# Patient Record
Sex: Female | Born: 1965 | Race: White | Hispanic: No | Smoking: Never smoker
Health system: Southern US, Community
[De-identification: ages and names within clinical notes are randomized; demographics above are authoritative.]

## PROBLEM LIST (undated history)

## (undated) VITALS — BP 93/65 | HR 108 | Temp 97.5°F | Resp 16 | Ht 67.0 in | Wt 134.0 lb

## (undated) DIAGNOSIS — I951 Orthostatic hypotension: Secondary | ICD-10-CM

## (undated) DIAGNOSIS — G90A Postural orthostatic tachycardia syndrome (POTS): Secondary | ICD-10-CM

## (undated) DIAGNOSIS — F32A Depression, unspecified: Secondary | ICD-10-CM

## (undated) DIAGNOSIS — R51 Headache: Secondary | ICD-10-CM

## (undated) DIAGNOSIS — F419 Anxiety disorder, unspecified: Secondary | ICD-10-CM

## (undated) DIAGNOSIS — Q211 Atrial septal defect, unspecified: Secondary | ICD-10-CM

## (undated) DIAGNOSIS — R000 Tachycardia, unspecified: Secondary | ICD-10-CM

## (undated) DIAGNOSIS — I499 Cardiac arrhythmia, unspecified: Secondary | ICD-10-CM

## (undated) DIAGNOSIS — K589 Irritable bowel syndrome without diarrhea: Secondary | ICD-10-CM

## (undated) DIAGNOSIS — F329 Major depressive disorder, single episode, unspecified: Secondary | ICD-10-CM

## (undated) DIAGNOSIS — M069 Rheumatoid arthritis, unspecified: Secondary | ICD-10-CM

## (undated) DIAGNOSIS — I498 Other specified cardiac arrhythmias: Secondary | ICD-10-CM

## (undated) DIAGNOSIS — M1990 Unspecified osteoarthritis, unspecified site: Secondary | ICD-10-CM

## (undated) HISTORY — DX: Atrial septal defect: Q21.1

## (undated) HISTORY — DX: Rheumatoid arthritis, unspecified: M06.9

## (undated) HISTORY — DX: Atrial septal defect, unspecified: Q21.10

## (undated) HISTORY — PX: LAPAROSCOPY: SHX197

---

## 1987-03-06 HISTORY — PX: CHOLECYSTECTOMY: SHX55

## 2001-01-01 ENCOUNTER — Encounter: Payer: Self-pay | Admitting: Emergency Medicine

## 2001-01-01 ENCOUNTER — Emergency Department (HOSPITAL_COMMUNITY): Admission: EM | Admit: 2001-01-01 | Discharge: 2001-01-01 | Payer: Self-pay | Admitting: Emergency Medicine

## 2001-12-22 ENCOUNTER — Other Ambulatory Visit: Admission: RE | Admit: 2001-12-22 | Discharge: 2001-12-22 | Payer: Self-pay | Admitting: Internal Medicine

## 2002-03-05 HISTORY — PX: DILATION AND CURETTAGE OF UTERUS: SHX78

## 2002-07-09 ENCOUNTER — Ambulatory Visit (HOSPITAL_COMMUNITY): Admission: RE | Admit: 2002-07-09 | Discharge: 2002-07-09 | Payer: Self-pay | Admitting: Obstetrics and Gynecology

## 2002-07-09 ENCOUNTER — Encounter (INDEPENDENT_AMBULATORY_CARE_PROVIDER_SITE_OTHER): Payer: Self-pay | Admitting: Specialist

## 2004-09-10 ENCOUNTER — Encounter: Admission: RE | Admit: 2004-09-10 | Discharge: 2004-09-10 | Payer: Self-pay | Admitting: Emergency Medicine

## 2005-01-10 ENCOUNTER — Ambulatory Visit: Payer: Self-pay | Admitting: Internal Medicine

## 2005-01-12 ENCOUNTER — Ambulatory Visit: Payer: Self-pay | Admitting: Internal Medicine

## 2005-01-12 ENCOUNTER — Encounter (INDEPENDENT_AMBULATORY_CARE_PROVIDER_SITE_OTHER): Payer: Self-pay | Admitting: *Deleted

## 2005-02-19 ENCOUNTER — Other Ambulatory Visit: Admission: RE | Admit: 2005-02-19 | Discharge: 2005-02-19 | Payer: Self-pay | Admitting: Obstetrics and Gynecology

## 2005-02-22 ENCOUNTER — Other Ambulatory Visit: Admission: RE | Admit: 2005-02-22 | Discharge: 2005-02-22 | Payer: Self-pay | Admitting: Obstetrics and Gynecology

## 2005-06-18 ENCOUNTER — Emergency Department (HOSPITAL_COMMUNITY): Admission: EM | Admit: 2005-06-18 | Discharge: 2005-06-18 | Payer: Self-pay | Admitting: Emergency Medicine

## 2006-01-29 ENCOUNTER — Encounter: Admission: RE | Admit: 2006-01-29 | Discharge: 2006-01-29 | Payer: Self-pay | Admitting: Emergency Medicine

## 2006-03-05 HISTORY — PX: PATENT FORAMEN OVALE CLOSURE: SHX2181

## 2006-05-30 ENCOUNTER — Ambulatory Visit (HOSPITAL_COMMUNITY): Admission: RE | Admit: 2006-05-30 | Discharge: 2006-05-30 | Payer: Self-pay | Admitting: Obstetrics and Gynecology

## 2006-05-30 ENCOUNTER — Encounter (INDEPENDENT_AMBULATORY_CARE_PROVIDER_SITE_OTHER): Payer: Self-pay | Admitting: Specialist

## 2006-07-16 ENCOUNTER — Encounter: Admission: RE | Admit: 2006-07-16 | Discharge: 2006-07-16 | Payer: Self-pay | Admitting: Cardiology

## 2007-03-21 ENCOUNTER — Emergency Department (HOSPITAL_COMMUNITY): Admission: EM | Admit: 2007-03-21 | Discharge: 2007-03-21 | Payer: Self-pay | Admitting: Emergency Medicine

## 2007-04-02 ENCOUNTER — Ambulatory Visit: Payer: Self-pay | Admitting: Internal Medicine

## 2007-05-29 ENCOUNTER — Ambulatory Visit: Payer: Self-pay | Admitting: Internal Medicine

## 2007-09-11 ENCOUNTER — Ambulatory Visit (HOSPITAL_COMMUNITY): Admission: RE | Admit: 2007-09-11 | Discharge: 2007-09-11 | Payer: Self-pay | Admitting: Obstetrics and Gynecology

## 2007-09-12 ENCOUNTER — Telehealth: Payer: Self-pay | Admitting: Internal Medicine

## 2007-09-15 ENCOUNTER — Telehealth: Payer: Self-pay | Admitting: Internal Medicine

## 2007-09-22 DIAGNOSIS — Z87898 Personal history of other specified conditions: Secondary | ICD-10-CM | POA: Insufficient documentation

## 2007-09-22 DIAGNOSIS — K589 Irritable bowel syndrome without diarrhea: Secondary | ICD-10-CM

## 2007-09-22 DIAGNOSIS — R1032 Left lower quadrant pain: Secondary | ICD-10-CM

## 2007-09-22 DIAGNOSIS — R110 Nausea: Secondary | ICD-10-CM

## 2007-09-25 ENCOUNTER — Ambulatory Visit: Payer: Self-pay | Admitting: Internal Medicine

## 2007-11-14 ENCOUNTER — Encounter: Payer: Self-pay | Admitting: Internal Medicine

## 2007-11-14 ENCOUNTER — Ambulatory Visit: Payer: Self-pay

## 2007-11-14 ENCOUNTER — Ambulatory Visit: Payer: Self-pay | Admitting: Internal Medicine

## 2008-02-04 ENCOUNTER — Ambulatory Visit: Payer: Self-pay | Admitting: Internal Medicine

## 2008-11-17 ENCOUNTER — Encounter (INDEPENDENT_AMBULATORY_CARE_PROVIDER_SITE_OTHER): Payer: Self-pay | Admitting: *Deleted

## 2008-12-22 ENCOUNTER — Inpatient Hospital Stay (HOSPITAL_COMMUNITY): Admission: AD | Admit: 2008-12-22 | Discharge: 2008-12-22 | Payer: Self-pay | Admitting: Obstetrics & Gynecology

## 2009-01-01 ENCOUNTER — Inpatient Hospital Stay (HOSPITAL_COMMUNITY): Admission: AD | Admit: 2009-01-01 | Discharge: 2009-01-01 | Payer: Self-pay | Admitting: Obstetrics and Gynecology

## 2009-01-05 ENCOUNTER — Inpatient Hospital Stay (HOSPITAL_COMMUNITY): Admission: AD | Admit: 2009-01-05 | Discharge: 2009-01-07 | Payer: Self-pay | Admitting: Obstetrics and Gynecology

## 2009-02-22 ENCOUNTER — Ambulatory Visit: Payer: Self-pay | Admitting: Cardiology

## 2009-02-22 ENCOUNTER — Inpatient Hospital Stay (HOSPITAL_COMMUNITY): Admission: EM | Admit: 2009-02-22 | Discharge: 2009-02-24 | Payer: Self-pay | Admitting: Emergency Medicine

## 2009-02-23 ENCOUNTER — Encounter (INDEPENDENT_AMBULATORY_CARE_PROVIDER_SITE_OTHER): Payer: Self-pay | Admitting: Internal Medicine

## 2009-03-10 ENCOUNTER — Encounter (INDEPENDENT_AMBULATORY_CARE_PROVIDER_SITE_OTHER): Payer: Self-pay | Admitting: *Deleted

## 2009-04-04 ENCOUNTER — Telehealth: Payer: Self-pay | Admitting: Internal Medicine

## 2009-04-07 ENCOUNTER — Ambulatory Visit: Payer: Self-pay | Admitting: Internal Medicine

## 2009-04-07 DIAGNOSIS — Q078 Other specified congenital malformations of nervous system: Secondary | ICD-10-CM

## 2009-04-07 DIAGNOSIS — Z862 Personal history of diseases of the blood and blood-forming organs and certain disorders involving the immune mechanism: Secondary | ICD-10-CM

## 2009-06-28 ENCOUNTER — Encounter (INDEPENDENT_AMBULATORY_CARE_PROVIDER_SITE_OTHER): Payer: Self-pay | Admitting: *Deleted

## 2009-07-27 ENCOUNTER — Ambulatory Visit: Payer: Self-pay | Admitting: Internal Medicine

## 2009-10-21 ENCOUNTER — Ambulatory Visit (HOSPITAL_BASED_OUTPATIENT_CLINIC_OR_DEPARTMENT_OTHER): Payer: 59 | Admitting: Oncology

## 2009-11-03 LAB — LACTATE DEHYDROGENASE: LDH: 137 U/L (ref 94–250)

## 2009-11-03 LAB — COMPREHENSIVE METABOLIC PANEL
ALT: 17 U/L (ref 0–35)
AST: 12 U/L (ref 0–37)
Albumin: 3.9 g/dL (ref 3.5–5.2)
Alkaline Phosphatase: 79 U/L (ref 39–117)
BUN: 12 mg/dL (ref 6–23)
CO2: 21 mEq/L (ref 19–32)
Calcium: 8.9 mg/dL (ref 8.4–10.5)
Chloride: 108 mEq/L (ref 96–112)
Creatinine, Ser: 0.84 mg/dL (ref 0.40–1.20)
Glucose, Bld: 88 mg/dL (ref 70–99)
Potassium: 4.1 mEq/L (ref 3.5–5.3)
Sodium: 139 mEq/L (ref 135–145)
Total Bilirubin: 0.3 mg/dL (ref 0.3–1.2)
Total Protein: 6.3 g/dL (ref 6.0–8.3)

## 2009-11-03 LAB — CBC & DIFF AND RETIC
BASO%: 0.8 % (ref 0.0–2.0)
Basophils Absolute: 0 10*3/uL (ref 0.0–0.1)
EOS%: 1.3 % (ref 0.0–7.0)
Eosinophils Absolute: 0.1 10*3/uL (ref 0.0–0.5)
HCT: 38.1 % (ref 34.8–46.6)
HGB: 12.5 g/dL (ref 11.6–15.9)
Immature Retic Fract: 3.4 % (ref 0.00–10.70)
LYMPH%: 31.4 % (ref 14.0–49.7)
MCH: 32.4 pg (ref 25.1–34.0)
MCHC: 32.8 g/dL (ref 31.5–36.0)
MCV: 98.7 fL (ref 79.5–101.0)
MONO#: 0.5 10*3/uL (ref 0.1–0.9)
MONO%: 8.7 % (ref 0.0–14.0)
NEUT#: 3.1 10*3/uL (ref 1.5–6.5)
NEUT%: 57.8 % (ref 38.4–76.8)
Platelets: 226 10*3/uL (ref 145–400)
RBC: 3.86 10*6/uL (ref 3.70–5.45)
RDW: 13 % (ref 11.2–14.5)
Retic %: 0.65 % (ref 0.50–1.50)
Retic Ct Abs: 25.09 10*3/uL (ref 18.30–72.70)
WBC: 5.3 10*3/uL (ref 3.9–10.3)
lymph#: 1.7 10*3/uL (ref 0.9–3.3)
nRBC: 0 % (ref 0–0)

## 2009-11-03 LAB — IRON AND TIBC: TIBC: 277 ug/dL (ref 250–470)

## 2009-11-09 ENCOUNTER — Encounter: Payer: Self-pay | Admitting: Internal Medicine

## 2010-03-11 ENCOUNTER — Emergency Department (HOSPITAL_COMMUNITY)
Admission: EM | Admit: 2010-03-11 | Discharge: 2010-03-11 | Payer: Self-pay | Source: Home / Self Care | Admitting: Emergency Medicine

## 2010-03-16 ENCOUNTER — Encounter
Admission: RE | Admit: 2010-03-16 | Discharge: 2010-03-16 | Payer: Self-pay | Source: Home / Self Care | Attending: Emergency Medicine | Admitting: Emergency Medicine

## 2010-03-20 LAB — DIFFERENTIAL
Basophils Absolute: 0 10*3/uL (ref 0.0–0.1)
Basophils Relative: 1 % (ref 0–1)
Eosinophils Absolute: 0.1 10*3/uL (ref 0.0–0.7)
Eosinophils Relative: 1 % (ref 0–5)
Lymphocytes Relative: 22 % (ref 12–46)
Lymphs Abs: 1.4 10*3/uL (ref 0.7–4.0)
Monocytes Absolute: 0.7 10*3/uL (ref 0.1–1.0)
Monocytes Relative: 11 % (ref 3–12)
Neutro Abs: 4.3 10*3/uL (ref 1.7–7.7)
Neutrophils Relative %: 66 % (ref 43–77)

## 2010-03-20 LAB — POCT I-STAT, CHEM 8
BUN: 11 mg/dL (ref 6–23)
Calcium, Ion: 1.25 mmol/L (ref 1.12–1.32)
Chloride: 108 mEq/L (ref 96–112)
Creatinine, Ser: 0.8 mg/dL (ref 0.4–1.2)
Glucose, Bld: 98 mg/dL (ref 70–99)
HCT: 39 % (ref 36.0–46.0)
Hemoglobin: 13.3 g/dL (ref 12.0–15.0)
Potassium: 3.8 mEq/L (ref 3.5–5.1)
Sodium: 141 mEq/L (ref 135–145)
TCO2: 23 mmol/L (ref 0–100)

## 2010-03-20 LAB — CBC
HCT: 36 % (ref 36.0–46.0)
Hemoglobin: 11.9 g/dL — ABNORMAL LOW (ref 12.0–15.0)
MCH: 33.7 pg (ref 26.0–34.0)
MCHC: 33.1 g/dL (ref 30.0–36.0)
MCV: 102 fL — ABNORMAL HIGH (ref 78.0–100.0)
Platelets: 209 10*3/uL (ref 150–400)
RBC: 3.53 MIL/uL — ABNORMAL LOW (ref 3.87–5.11)
RDW: 14.6 % (ref 11.5–15.5)
WBC: 6.6 10*3/uL (ref 4.0–10.5)

## 2010-03-20 LAB — RAPID URINE DRUG SCREEN, HOSP PERFORMED
Amphetamines: NOT DETECTED
Barbiturates: NOT DETECTED
Benzodiazepines: POSITIVE — AB
Cocaine: NOT DETECTED
Opiates: NOT DETECTED
Tetrahydrocannabinol: NOT DETECTED

## 2010-03-20 LAB — CK: Total CK: 70 U/L (ref 7–177)

## 2010-03-20 LAB — ETHANOL: Alcohol, Ethyl (B): <5 mg/dL (ref 0–10)

## 2010-04-02 ENCOUNTER — Emergency Department (HOSPITAL_BASED_OUTPATIENT_CLINIC_OR_DEPARTMENT_OTHER)
Admission: EM | Admit: 2010-04-02 | Discharge: 2010-04-02 | Payer: Self-pay | Source: Home / Self Care | Admitting: Emergency Medicine

## 2010-04-02 LAB — URINE MICROSCOPIC-ADD ON

## 2010-04-02 LAB — POCT CARDIAC MARKERS
CKMB, poc: <1 ng/mL — ABNORMAL LOW (ref 1.0–8.0)
Myoglobin, poc: 66.3 ng/mL (ref 12–200)
Troponin i, poc: <0.05 ng/mL (ref 0.00–0.09)

## 2010-04-02 LAB — URINALYSIS, ROUTINE W REFLEX MICROSCOPIC
Hgb urine dipstick: NEGATIVE
Ketones, ur: >80 mg/dL — AB
Nitrite: NEGATIVE
Protein, ur: NEGATIVE mg/dL
Specific Gravity, Urine: 1.02 (ref 1.005–1.030)
Urine Glucose, Fasting: NEGATIVE mg/dL
Urobilinogen, UA: 0.2 mg/dL (ref 0.0–1.0)
pH: 6.5 (ref 5.0–8.0)

## 2010-04-02 LAB — PREGNANCY, URINE: Preg Test, Ur: NEGATIVE

## 2010-04-02 LAB — BASIC METABOLIC PANEL
BUN: 8 mg/dL (ref 6–23)
CO2: 20 mEq/L (ref 19–32)
Calcium: 9.9 mg/dL (ref 8.4–10.5)
Chloride: 110 mEq/L (ref 96–112)
Creatinine, Ser: 0.9 mg/dL (ref 0.4–1.2)
GFR calc Af Amer: >60 mL/min (ref >60)
GFR calc non Af Amer: >60 mL/min (ref >60)
Glucose, Bld: 112 mg/dL — ABNORMAL HIGH (ref 70–99)
Potassium: 3.6 mEq/L (ref 3.5–5.1)
Sodium: 142 mEq/L (ref 135–145)

## 2010-04-04 NOTE — Letter (Signed)
Summary: Appointment - Missed  [facility], Main Office  [address]   [place], [place] [address]   Phone: [phone]  Fax: [phone]     March 10, 2009 MRN: [id number]   [facility] [address] [place], [place]  [address]   Dear Ms. PICKNEY,  Our records indicate you missed your appointment on 02/11/09 with Dr. [name].                                    It is very important that we reach you to reschedule this appointment. We look forward to participating in your health care needs. Please contact us at the number listed above at your earliest convenience to reschedule this appointment.     Sincerely,   Ruel Favors Scheduling Team

## 2010-04-04 NOTE — Progress Notes (Signed)
Summary: pt wants appt for low b/p ( review w/ SK)  Phone Note Call from Patient Call back at Home Phone [phone]   Caller: Patient Reason for Call: Talk to Nurse, Talk to Doctor Summary of Call: pt want to have an appt b/p was 80/60 standing 86/60 sitting she was at pcp today Initial call taken by: Omer Jack,  April 04, 2009 1:41 PM  Follow-up for Phone Call        I called and spoke with the pt. She states she saw her PCP today for a regular physical and he noted her bp readings as stated above. The pt states she was admitted to [facility] last month for 2 days due to syncope and SBP of . The pt states she had been dealing wth diarrhea prior to that so she was admitted for hydration at that time as well. The pt states she was urged by her PCP today to increase her salt intake and hydrate herself. I explained to the pt I would forward this message to Dr. [name] and his nurse Shawna Orleans to see if they need to work her in for an appt. The pt states she missed her last appt due to the fact that she just had a baby about 3 months ago. I will ask Melanie-RN for Dr. [name] to call the pt back tomorrow with any recommendations Dr. [name] may have and with an appt for f/u. The pt is agreeable. Follow-up by: Sherri Rad, RN, BSN,  April 04, 2009 1:54 PM  Additional Follow-up for Phone Call Additional follow up Details #1::        Pt worked into schedule on 04/26/09 Additional Follow-up by: Duncan Dull, RN, BSN,  April 05, 2009 8:59 AM

## 2010-04-04 NOTE — Assessment & Plan Note (Signed)
Summary: rov/jml   Primary Provider:  Leslee Home, M.D.   History of Present Illness: Jennifer [name] is seen in followup for her dysautonomia manifesting primarily as pots and now has trouble with orthostatic hypotension. She was actually admitted to hospital a couple of months ago in the context of significant dehydration related to diarrhea. This was treated empirically as C. difficile was of poor resolution. She's had no further syncope. She does have orthostatic intolerance and some presyncope. There is accompanying nausea particularly at night.  She gave birth about 4 or 5 months ago preceding all of this. The post partum period has further been complicated by depression for which she is being treated  Current Medications (verified): 1)  Flexeril 10 Mg  Tabs (Cyclobenzaprine Hcl) .... Take 1 Tablet By Mouth As Needed 2)  Topamax 100 Mg Tabs (Topiramate) .... Take 3 Tablets Once Daily 3)  Paxil 40 Mg Tabs (Paroxetine Hcl) .... Take One Tablet Once Daily 4)  Cipro 500 Mg Tabs (Ciprofloxacin Hcl) .... Take One Tablet Two Times A Day For 10 Days 5)  Fioricet 50-325-40 Mg Tabs (Butalbital-Apap-Caffeine) .... Take One Tablet As Needed  Allergies (verified): 1)  ! Sulfa  Past History:  Past Medical History: Last updated: 09/22/2007 Current Problems:  NAUSEA (ICD-787.02) ABDOMINAL PAIN, LEFT LOWER QUADRANT (ICD-789.04) IRRITABLE BOWEL SYNDROME (ICD-564.1) MIGRAINES, HX OF (ICD-V13.8) Family Hx of COLON CANCER (ICD-153.9)  Past Surgical History: Last updated: 09/22/2007 cholecystectomy 1990 D & C laproscopy 1993 Heart Surgery (PFO) 2008  Family History: Last updated: 09/25/2007 Family History of Colon Cancer: Grandmother Paternal Family History of Liver Cancer: Paternal Grandmother Family History of Diabetes:  Uncle Family History of Heart Disease: Maternal Grandfather Family History of Liver Disease/Cirrhosis:Great uncle  Social History: Last updated: 09/25/2007 Patient is  divorced and has 2 children Alcohol Use - yes-rarely Illicit Drug Use - no Patient has never smoked.  Daily Caffeine Use  Vital Signs:  Patient profile:   45 year old female Height:      67 inches Weight:      176 pounds BMI:     27.67 Pulse rate:   78 / minute Pulse (ortho):   96 / minute Pulse rhythm:   irregular BP sitting:   98 / 66  (left arm) BP standing:   86 / 68 Cuff size:   regular  Vitals Entered By: Judithe Modest CMA (April 07, 2009 8:41 AM)  Serial Vital Signs/Assessments:  Time      Position  BP       Pulse  Resp  Temp     By 9:10 AM   Lying RA  91/64    74                    Amanda Trulove CMA 9:10 AM   Sitting   92/68    84                    Amanda Trulove CMA 9:10 AM   Standing  86/68    96                    Amanda Trulove CMA  Comments: 9:10 AM 2 minutes-102/66 HR 86 3 minutes-104/68 HR 84  Patient reported dizziness increasing as she stood and nausea that also increased.  Pt had to sit down for fear of falling then standing pressures where repeated manually.   By: Judithe Modest CMA    Physical Exam  General:  The patient was alert and oriented in no acute distress. HEENT Normal.  Neck veins were flat, carotids were brisk.  Lungs were clear.  Heart sounds were regular without murmurs or gallops.  Abdomen was soft with active bowel sounds. There is no clubbing cyanosis or edema. Skin Warm and dry affect is flat   Impression & Recommendations:  Problem # 1:  DYSAUTONOMIA (ICD-742.8) her dysautonomia  has been aggravated in the setting of her postpartum situation. Her to be in factors could include volume shifts, the chronic diarrhea which is now finally resolved, or the depression. In any case she is quite symptomatic. She has a history of migraine headaches which may be adversely impacted by the use of an alpha agonist; I will need to look this up. Alternatively we discussed the use of salt replacement. I have given her the name of a couple  of places where she may be able to get salt tablets. We discussed potential for bloating and weight gain both of which are of concern for her.  I've also instructed her to raise the head of her bed about 6 inches as this may help her nocturnal orthostasis.  It is further important for them to be aggressive in treatment of her postpartum depression.  the other issue which will need to be explored is a potential contribution of hemochromatosis to dysautonomia; hospital records were reviewed from a most of the above but has no reference to the hemochromatosis. She is scheduled to see Dr. Cyndie Chime    Patient Instructions: 1)  Your physician recommends that you schedule a follow-up appointment in: 8-12 WEEKS

## 2010-04-04 NOTE — Assessment & Plan Note (Signed)
Summary: rov/jml   Visit Type:  Follow-up Primary Provider:  Leslee Home, M.D.  CC:  MILD CHEST PAIN.  History of Present Illness: Mrs. Insley is seen in followup for her dysautonomia manifesting primarily as pots and now has trouble with orthostatic hypotension.   . She's had no further syncope. She does have orthostatic intolerance and some presyncope. There is accompanying nausea particularly at night.  She gave birth about 4 or 5 months ago preceding all of this. The post partum period has further been complicated by depression for which she is being treated and thPristiq has been significantly beneficial in this regard.  she is working hard of salt and water ingestion; she struggled with her weight however.  I should note that she has a past history of migraine headaches  Current Medications (verified): 1)  Flexeril 10 Mg  Tabs (Cyclobenzaprine Hcl) .... Take 1 Tablet By Mouth As Needed 2)  Topamax 100 Mg Tabs (Topiramate) .... Take 3 Tablets Once Daily 3)  Fioricet 50-325-40 Mg Tabs (Butalbital-Apap-Caffeine) .... Take One Tablet As Needed 4)  Pristiq 100 Mg Xr24h-Tab (Desvenlafaxine Succinate) .Marland Kitchen.. 1 Tab Once Daily 5)  Alprazolam 1 Mg Tabs (Alprazolam) .... As Needed 6)  Promethazine .... As Needed 7)  Vitamin D 50,000 Units .Marland Kitchen.. 1 Tab Q Weekly 8)  Ibubprofen .... Prn  Allergies (verified): 1)  ! Sulfa  Vital Signs:  Patient profile:   45 year old female Height:      67 inches Weight:      171 pounds Pulse rate:   68 / minute BP sitting:   92 / 66  (left arm) Cuff size:   regular  Vitals Entered By: Burnett Kanaris, CNA (Jul 27, 2009 4:18 PM)  Physical Exam  General:  The patient was alert and oriented in no acute distress. HEENT Normal.  Neck veins were flat, carotids were brisk.  Lungs were clear.  Heart sounds were regular without murmurs or gallops.  Abdomen was soft with active bowel sounds. There is no clubbing cyanosis or edema. Skin Warm and  dry    Impression & Recommendations:  Problem # 1:  DYSAUTONOMIA (ICD-742.8) swill continue to work consult and water retention. She and we will look at the possibility of ProAmatine but we aren't concerned about its potential aggravation of her migraine headaches. We also discussed Florinef but she is averse to the idea of salt retention. We also discussed the issues of protection in the heat.  We spent more than 50% of her 25 minute visit discussing physiology and potential therapeutic maneuvers to mitigate against her dysautonomic tendencies.  Patient Instructions: 1)  Your physician recommends that you schedule a follow-up appointment in: 6 MONTHS WITH DR [name]

## 2010-04-04 NOTE — Letter (Signed)
Summary: [facility]  [facility]   Imported By: Marylou Mccoy 12/08/2009 10:22:18  _____________________________________________________________________  External Attachment:    Type:   Image     Comment:   External Document

## 2010-04-04 NOTE — Letter (Signed)
Summary: Appointment - Missed  [facility], Main Office  [address]   [place], [place] [address]   Phone: [phone]  Fax: [phone]     June 28, 2009 MRN: [id number]   [facility] [address] [place], [place]  [address]   Dear Ms. LIEVANOS,  Our records indicate you missed your appointment on 06/10/09 with Dr. [name]. It is very important that we reach you to reschedule this appointment. We look forward to participating in your health care needs. Please contact us at the number listed above at your earliest convenience to reschedule this appointment.     Sincerely,   Ruel Favors Scheduling Team

## 2010-04-05 ENCOUNTER — Encounter: Payer: Self-pay | Admitting: Emergency Medicine

## 2010-06-05 LAB — COMPREHENSIVE METABOLIC PANEL
ALT: 16 U/L (ref 0–35)
AST: 18 U/L (ref 0–37)
Albumin: 3.1 g/dL — ABNORMAL LOW (ref 3.5–5.2)
Alkaline Phosphatase: 70 U/L (ref 39–117)
BUN: 9 mg/dL (ref 6–23)
CO2: 22 mEq/L (ref 19–32)
Calcium: 8.4 mg/dL (ref 8.4–10.5)
Chloride: 110 mEq/L (ref 96–112)
Creatinine, Ser: 1 mg/dL (ref 0.4–1.2)
GFR calc Af Amer: >60 mL/min (ref >60)
GFR calc non Af Amer: >60 mL/min (ref >60)
Glucose, Bld: 110 mg/dL — ABNORMAL HIGH (ref 70–99)
Potassium: 4 mEq/L (ref 3.5–5.1)
Sodium: 138 mEq/L (ref 135–145)
Total Bilirubin: 0.7 mg/dL (ref 0.3–1.2)
Total Protein: 5.8 g/dL — ABNORMAL LOW (ref 6.0–8.3)

## 2010-06-05 LAB — POCT CARDIAC MARKERS: Myoglobin, poc: 85.7 ng/mL (ref 12–200)

## 2010-06-05 LAB — URINALYSIS, ROUTINE W REFLEX MICROSCOPIC
Bilirubin Urine: NEGATIVE
Glucose, UA: NEGATIVE mg/dL
Hgb urine dipstick: NEGATIVE
Ketones, ur: NEGATIVE mg/dL
Nitrite: NEGATIVE
Protein, ur: NEGATIVE mg/dL
Specific Gravity, Urine: 1.013 (ref 1.005–1.030)
Urobilinogen, UA: 1 mg/dL (ref 0.0–1.0)
pH: 6.5 (ref 5.0–8.0)

## 2010-06-05 LAB — GLUCOSE, CAPILLARY: Glucose-Capillary: 106 mg/dL — ABNORMAL HIGH (ref 70–99)

## 2010-06-05 LAB — DIFFERENTIAL
Basophils Absolute: 0 10*3/uL (ref 0.0–0.1)
Basophils Relative: 0 % (ref 0–1)
Eosinophils Absolute: 0.2 10*3/uL (ref 0.0–0.7)
Eosinophils Relative: 3 % (ref 0–5)
Monocytes Absolute: 0.4 10*3/uL (ref 0.1–1.0)
Monocytes Relative: 7 % (ref 3–12)

## 2010-06-05 LAB — URINE MICROSCOPIC-ADD ON

## 2010-06-05 LAB — CBC
HCT: 37.4 % (ref 36.0–46.0)
Platelets: 186 10*3/uL (ref 150–400)
RDW: 12.4 % (ref 11.5–15.5)
WBC: 5.7 10*3/uL (ref 4.0–10.5)

## 2010-06-05 LAB — URINE CULTURE
Colony Count: NO GROWTH
Culture: NO GROWTH

## 2010-06-05 LAB — CARDIAC PANEL(CRET KIN+CKTOT+MB+TROPI)
CK, MB: 1.3 ng/mL (ref 0.3–4.0)
Total CK: 44 U/L (ref 7–177)
Total CK: 49 U/L (ref 7–177)
Troponin I: 0.01 ng/mL (ref 0.00–0.06)

## 2010-06-05 LAB — CLOSTRIDIUM DIFFICILE EIA: C difficile Toxins A+B, EIA: NEGATIVE

## 2010-06-05 LAB — T4, FREE: Free T4: 0.93 ng/dL (ref 0.80–1.80)

## 2010-06-07 LAB — CBC
HCT: 36.1 % (ref 36.0–46.0)
HCT: 40.8 % (ref 36.0–46.0)
Hemoglobin: 13.9 g/dL (ref 12.0–15.0)
MCV: 106.9 fL — ABNORMAL HIGH (ref 78.0–100.0)
RBC: 3.38 MIL/uL — ABNORMAL LOW (ref 3.87–5.11)
RBC: 3.83 MIL/uL — ABNORMAL LOW (ref 3.87–5.11)
WBC: 18.4 10*3/uL — ABNORMAL HIGH (ref 4.0–10.5)

## 2010-07-18 NOTE — Assessment & Plan Note (Signed)
 HEALTHCARE                         ELECTROPHYSIOLOGY OFFICE NOTE   Jennifer, [name]                       MRN:          [id number]  DATE:11/14/2007                            DOB:          05-04-1965    Jennifer [name] is seen in followup for her dysautonomia.  She is also status  post PFO repair at [facility].  She has had no recurrent syncope.  She has had  a couple of episodes of lightheadedness associated with having been on  the beach or snow clearing in the [place], but otherwise, is doing quite  well.  She has been aggressive in her salt and water repletion.   Her other medications include Effexor and Topamax.   PHYSICAL EXAMINATION:  VITAL SIGNS:  Today, her blood pressure was  104/60, her weight was 152, and her pulse was 68.  LUNGS:  Clear.  HEART:  Heart sounds were regular.  EXTREMITIES:  Without edema.   Her echo demonstrated normal left ventricular function, normal atrial  size.   IMPRESSION:  1. Postural orthostatic tachycardia with orthostatic intolerance.  2. Significant psychosocial stress related to her sons - better.  3. Status post patent foramen ovale repair.   Jennifer [name] is stable.  We will plan to see her again in a year.     Duke Salvia, MD, [facility]  Electronically Signed    SCK/MedQ  DD: 11/14/2007  DT: 11/15/2007  Job #: [id number]   cc:   Jennifer [name], M.D.

## 2010-07-18 NOTE — Assessment & Plan Note (Signed)
[facility]                         ELECTROPHYSIOLOGY OFFICE NOTE   LACORA, FOLMER                       MRN:          [id number]  DATE:05/29/2007                            DOB:          1965-04-21    Mrs. Szilagyi comes in 2 months after initial evaluation for what was  thought to be at that point dysautonomia.  She is feeling some better.  She has increase her salt intake and fluid intake.  She continues to  have some dizziness, however, particularly waking at night, as well as  early in the morning or getting up from a chair quickly.  This is  recently accompanied by nausea, particularly when she showers, her  shower water being more tepid now than hot, and this is helping some,  but not a lot.   Her weight is 155 pounds which is up 5 pounds.  She is aware of the  increased weight.  Her blood pressure was 111/68, the pulse was 79.  Lungs were clear.  Heart sounds were regular.   IMPRESSION:  1. Dysautonomia manifested by postural tachycardia and recurrent      syncope.  2. Significant psychosocial stress.   Mrs. Alpern is doing better.  We discussed alternative non-medicinal  therapies, including raising the head of bed 2 inches initially, then 6  inches.  We talked about the role of isometric exercise prior to  standing and potentially using a shower chair.   We will see her again in four months' time.  At this point, she is quite  reluctant to pursue any more medicinal therapy.     Duke Salvia, MD, [facility]  Electronically Signed    SCK/MedQ  DD: 05/29/2007  DT: 05/30/2007  Job #: [id number]   cc:   Reuben Likes, M.D.

## 2010-07-18 NOTE — Letter (Signed)
April 02, 2007    Jennifer [name], M.D.  [address]  [place], [place] [address]   RE:  Jennifer [name]  MRN:  [id number]  /  DOB:  Jan 16, 1966   Dear Jennifer [name]:   It was a pleasure to see Jennifer [name] today at your request because of  recurrent syncope.   As she knows she is a 45 year old divorced mother of 2 who has had two  recent syncopal episodes.  The first episode occurred about a week or 2  ago where she had got up from being in bed.  She went to her son's room.  She talked to him and started to turn to leave the room and passed out,  awakening sort of half as she went down.  She got up from the floor,  felt a little bit dizzy, went back to bed and felt better about 15  minutes later.  She showered later that morning and became quite dizzy  and nauseated in the shower.   The next episode occurred the next morning.  Again she was laying down  in her bed.  She got up for a bed to walk down the hall and then lost  consciousness outside her other son's bedroom hitting her head on the  door and her arm on the door on the door jam.  The post event symptoms  included nausea.  There was no diaphoresis and no significant prodrome.   She does have a longstanding history of presyncope which is often  orthostatic.  It is not infrequently accompanied by nausea.  She has  shower intolerance with dizziness and nausea.  She has heat intolerance  as well.  Her diet sounds like it is quite salt replete.  Her volume  status sounds like it is quite fluid deplete.   Her psychosocial status may be contributing.  She is a single mother, Jennifer [name]  you know.  She has two boys who carry diagnosis of ADHD and life is  really quite stressful.  She marked anxiety on review of systems.  I do  not know whether depression is part of this or not.   Her cardiac history is notable for a recent Her cardiac history is also  notable for recent closure of PFO has been associated with a significant  improvement in  her migraine frequency.   Her other surgeries include laparoscopy for endometriosis.  Cryosurgery  on her cervix.  Cholecystectomy and a D&C.   SOCIAL HISTORY:  Is as noted previously.  She has 2 sons 11 and 8.  She  does not use cigarettes or recreational drugs.  She does drink alcohol  occasionally.   REVIEW OF SYSTEMS:  In addition to above is notable for menstrual  dysfunction, stomach disorders, and constipation.   Medications include the Topamax 300 mg a day, Effexor 150, Flexeril and  Zanaflex as well as baby aspirin.   She is allergic to SULFA.   EXAMINATION:  She is a young Caucasian female appearing her stated age  of 89.  Her blood pressure was 99/71 with a pulse of 92 lying, sitting was  96/71, pulse of 93.  Standing at zero minutes was 100/60 with a pulse of  150 and 2 minutes at 115/86 with a pulse was 128, and 5 minutes were  90/68 with a pulse of 114.  HEENT exam demonstrated there is no xanthoma.  The neck veins were flat.  Carotids are brisk and full bilaterally out  bruits.  The back with a without kyphosis or scoliosis.  Lungs were clear.  HEART:  Sounds were regular without murmurs or gallops.  ABDOMEN:  Soft with active bowel sounds without midline pulsation or  hepatomegaly.  Femoral pulses were 2+.  Distal pulses were intact.  There is no  clubbing, cyanosis or edema.  Neurological exam was grossly normal.  Her skin was warm and dry.   Electrocardiogram from your office demonstrated sinus rhythm at 88 with  intervals of 0.16/0.08/0.39.   IMPRESSION:  1. Recurrent syncope.  2. Evidence of postural intolerance with orthostatic tachycardia.  3. Anxiety.  4. Status post PFO closure.   DISCUSSION:  Jennifer [name], Jennifer [name] has abrupt syncope which was quite striking.  These occur in the constellation of significant symptoms of orthostatic  intolerance and evidence on vital signs examination of the same.  This  is most likely mechanism especially given the fact  that her heart has  been evaluated and is almost certainly structurally normal apart from  PFO.   I have given her the information from the [place] book from [place] as well  as the at the website for the NDRF.org website to read about her  dysautonomia.   From a therapeutic strategy I suggest that we do is increase her salt  and water intake copiously until her urine is clear.  I am not sure that  other therapies right now are going to be helpful.  I did suggest to her  that if the stress in her life is such that counseling may be helpful  addressing psychosocial aspects here are important as they seem to co-  migrate with her frequency of dysautonomic symptoms.  She will consider  this.   We will plan to see her again in 4 to 6 weeks to she how is doing.   Thank you for the consultation.    Sincerely,      Jennifer Salvia, MD, [facility]  Electronically Signed    SCK/MedQ  DD: 04/02/2007  DT: 04/02/2007  Job #: [id number]   CC:    Jennifer [name], M.D. at [facility]

## 2010-07-18 NOTE — Assessment & Plan Note (Signed)
[facility]                         ELECTROPHYSIOLOGY OFFICE NOTE   Jennifer [name], Jennifer [name]                       MRN:          [id number]  DATE:02/04/2008                            DOB:          [birth year]    Jennifer [name] comes in today to discuss pregnancy in the context of her  dysautonomic syndromes.   She did not have a diagnosis of dysautonomia or symptoms of  lightheadedness prior to delivery of her more recent child 9 years ago.   We reviewed the issues of volume expansion and the potential benefit  during the first trimester and second trimester.  We dicussed the issue  of uterine obstruction to venous return in the third trimester, and that  there had been some reports of significant difficulties with delivery in  patients with dysautonomic syndromes.  I also speculated that given the  fact that these syndromes have been around a long time, but only  recently described that undoubtedly many numbers of women had delivered  babies without significant difficulty with these dysautonomic syndromes.  I think the other issue will be attention to volume loss at the time of  delivery and vasodilators that might be used peripartum.   We will be glad to be available to her obstetrician as needed.     Duke Salvia, MD, [facility]  Electronically Signed    SCK/MedQ  DD: 02/04/2008  DT: 02/05/2008  Job #: [id number]

## 2010-07-21 NOTE — H&P (Signed)
NAMELOVELY, KERINS NO.:  [id number]   MEDICAL RECORD NO.:  [id number]          PATIENT TYPE:  AMB   LOCATION:  [facility]                           FACILITY:  [facility]   PHYSICIAN:  Zelphia Cairo, MD    DATE OF BIRTH:  11-25-1965   DATE OF ADMISSION:  DATE OF DISCHARGE:                              HISTORY & PHYSICAL   A 45 year old white female with persistent menorrhagia.  She reports  menstrual cycles are regular with increasing dysmenorrhea and  menorrhagia associated with quarter-size clotting.  She was tried on  oral contraceptive pills.  However, due to worsening headache she  discontinued.   PAST MEDICAL HISTORY:  Include migraine headaches.   SURGICAL HISTORY:  Cholecystectomy, laparoscopy, and varicose vein  surgery.   ALLERGIES:  SULFA.   MEDICATIONS:  Topamax and Effexor.   Pap smear done January 2008 was consistent with ASCUS but negative HPV.  Gonorrhea and Chlamydia cultures were negative.   PHYSICAL EXAM:  VITAL SIGNS:  Height 5 feet 7 inches, weight 161, blood  pressure 108/70, hemoglobin 11.7.  Urinalysis negative.  HEENT:  Head and neck normal.  No thyromegaly or nodularity.  HEART:  Regular.  LUNGS:  Clear bilaterally.  ABDOMEN: Soft, nontender.  No palpable masses.  BREASTS:  Symmetrical.  No masses or lesions.  PELVIC:  Reveals normal external female genitalia.  Vagina and cervix  are normal.  No lesions.  Uterus is mobile and nontender.  No adnexal  masses are noted. Ultrasound was performed February 2008 showing no  adnexal or uterine masses.  There is no free fluid within the pelvis.  Endometrial thickness measured 0.4 cm.   ASSESSMENT/PLAN:  Menorrhagia. Treatment options were discussed with the  patient who elects to proceed with hysteroscopy D&C.  Informed consent  was obtained.      Zelphia Cairo, MD  Electronically Signed     GA/MEDQ  D:  05/29/2006  T:  05/29/2006  Job:  [id number]

## 2010-07-21 NOTE — Op Note (Signed)
[name], Jennifer [name] NO.:  [id number]   MEDICAL RECORD NO.:  [id number]          PATIENT TYPE:  AMB   LOCATION:  SDC                           FACILITY:  [facility]   PHYSICIAN:  Zelphia Cairo, MD    DATE OF BIRTH:  Jul 05, 1965   DATE OF PROCEDURE:  05/30/2006  DATE OF DISCHARGE:                               OPERATIVE REPORT   PREOPERATIVE DIAGNOSIS:  Menorrhagia.   POSTOPERATIVE DIAGNOSIS:  Menorrhagia.   PROCEDURE:  Hysteroscopy, dilatation and curettage.   SURGEON:  Zelphia Cairo, MD   ASSISTANT:  None.   ANESTHESIA:  General.   SPECIMEN:  Endometrial curetting's.   ESTIMATED BLOOD LOSS:  Minimal.   COMPLICATIONS:  None.   FLUID DEFICIT:  (Sorbitol) 10 mL.   CONDITION:  Stable and extubated to recovery room.   FINDINGS:  Uterine lining appeared smooth without evidence of fibroids  or polyps.  Endometrial lining was thin, consistent with a cycle day  four.  Bilateral ostia were visualized and were normal.   PROCEDURE:  The patient was taken to the operating room where she was  placed in the dorsal lithotomy position using Allen stirrups.  General  anesthesia was obtained without difficulty.  She was prepped and draped  in sterile fashion and a sterile catheter was used to drain her bladder  for approximately 25 mL of clear urine.  A bivalve speculum was placed  in the vagina and a single-tooth tenaculum was placed on the anterior  lip of the cervix.  The cervix was easily dilated using Pratt dilators.  The diagnostic hysteroscope was inserted into the uterus and the cavity  was inspected.  The findings as above.  The hysteroscope was then  removed and a gentle curetting was performed.  The specimen was placed  on Telfa and passed off and sent to pathology.  The single-tooth  tenaculum  was then removed from the cervix.  8 mL of 1% lidocaine were used for  local anesthesia in the cervical block.  Cervical hemostasis was  assured.  The speculum was  removed.  Sponge, lap, needle and instrument  counts were correct x2.  The patient was taken to the recovery room in  stable condition.      Zelphia Cairo, MD  Electronically Signed     GA/MEDQ  D:  05/30/2006  T:  05/30/2006  Job:  [id number]

## 2010-10-19 ENCOUNTER — Encounter (HOSPITAL_COMMUNITY)
Admission: RE | Admit: 2010-10-19 | Discharge: 2010-10-19 | Disposition: A | Discharge status: Home / Self Care | Payer: 59 | Source: Ambulatory Visit | Attending: Obstetrics and Gynecology | Admitting: Obstetrics and Gynecology

## 2010-10-19 ENCOUNTER — Encounter (HOSPITAL_COMMUNITY): Payer: Self-pay

## 2010-10-19 HISTORY — DX: Headache: R51

## 2010-10-19 HISTORY — DX: Orthostatic hypotension: I95.1

## 2010-10-19 HISTORY — DX: Depression, unspecified: F32.A

## 2010-10-19 HISTORY — DX: Hemochromatosis, unspecified: E83.119

## 2010-10-19 HISTORY — DX: Irritable bowel syndrome, unspecified: K58.9

## 2010-10-19 HISTORY — DX: Cardiac arrhythmia, unspecified: I49.9

## 2010-10-19 HISTORY — DX: Anxiety disorder, unspecified: F41.9

## 2010-10-19 HISTORY — DX: Major depressive disorder, single episode, unspecified: F32.9

## 2010-10-19 HISTORY — DX: Unspecified osteoarthritis, unspecified site: M19.90

## 2010-10-19 LAB — CBC
MCHC: 33.6 g/dL (ref 30.0–36.0)
MCV: 98.1 fL (ref 78.0–100.0)
Platelets: 153 10*3/uL (ref 150–400)
RDW: 12.5 % (ref 11.5–15.5)
WBC: 4.8 10*3/uL (ref 4.0–10.5)

## 2010-10-19 LAB — SURGICAL PCR SCREEN: Staphylococcus aureus: NEGATIVE

## 2010-10-19 NOTE — Anesthesia Preprocedure Evaluation (Signed)
Anesthesia Evaluation  Name, MR# and DOB Patient awake  General Assessment Comment  Reviewed: Allergy & Precautions, H&P , NPO status , Patient's Chart, lab work & pertinent test results  Airway Mallampati: I TM Distance: >3 FB Neck ROM: Full    Dental   Pulmonary    pulmonary exam normalPulmonary Exam Normal     Cardiovascular     Neuro/Psych   GI/Hepatic/Renal negative GI ROS, negative Liver ROS, and negative Renal ROS (+)       Endo/Other  Negative Endocrine ROS (+)      Abdominal Normal abdominal exam  (+)   Musculoskeletal negative musculoskeletal ROS (+)   Hematology   Peds  Reproductive/Obstetrics negative OB ROS    Anesthesia Other Findings Had a PFO repaired in 2008. Echo is normal. Takes med for migraines            Anesthesia Physical Anesthesia Plan  ASA: II  Anesthesia Plan: General   Post-op Pain Management:    Induction: Intravenous  Airway Management Planned: Oral ETT  Additional Equipment:   Intra-op Plan:   Post-operative Plan: Extubation in OR  Informed Consent: I have reviewed the patients History and Physical, chart, labs and discussed the procedure including the risks, benefits and alternatives for the proposed anesthesia with the patient or authorized representative who has indicated his/her understanding and acceptance.   Dental advisory given  Plan Discussed with:   Anesthesia Plan Comments:         Anesthesia Quick Evaluation

## 2010-10-19 NOTE — Patient Instructions (Signed)
20 Jennifer [name]  10/19/2010   Your procedure is scheduled on:  10/26/10  Report to [facility] at 0600 AM.  Call this number if you have problems the morning of surgery: [phone]   Remember:   Do not eat food:After Midnight.  Do not drink clear liquids: After Midnight.  Take these medicines the morning of surgery with A SIP OF WATER: per anesthesia   Do not wear jewelry, make-up or nail polish.  Do not wear lotions, powders, or perfumes. You may wear deodorant.  Do not shave 48 hours prior to surgery.  Do not bring valuables to the hospital.  Contacts, dentures or bridgework may not be worn into surgery.  Leave suitcase in the car. After surgery it may be brought to your room.  For patients admitted to the hospital, checkout time is 11:00 AM the day of discharge.   Patients discharged the day of surgery will not be allowed to drive home.  Name and phone number of your driver: Cherlynn Polo- [phone]  Special Instructions: CHG Shower Use Special Wash: 1/2 bottle night before surgery and 1/2 bottle morning of surgery.   Please read over the following fact sheets that you were given:

## 2010-10-24 NOTE — H&P (Signed)
Jennifer [name], SCHLEGEL NO.:  [id number]  MEDICAL RECORD NO.:  [id number]  LOCATION:  PERIO                         FACILITY:  [facility]  PHYSICIAN:  Zelphia Cairo, MD    DATE OF BIRTH:  02-Apr-1965  DATE OF ADMISSION:  08/31/2010 DATE OF DISCHARGE:                             HISTORY & PHYSICAL   A 45 year old, G4, P3, presents today for surgical management of menorrhagia.  She also desires permanent sterilization.  PAST MEDICAL HISTORY: 1. Migraine headaches. 2. Irritable bowel syndrome. 3. History of urinary tract infections.  MEDICATIONS:  Include Pristiq, Topamax, Xanax, tramadol, Xifaxan, and Librax.  SOCIAL HISTORY:  Negative for tobacco, alcohol, and drug use.  ALLERGIES:  Sulfa and morphine.  SURGICAL HISTORY:  Negative.  PHYSICAL EXAMINATION:  GENERAL:  She is in no acute distress. VITAL SIGNS:  Blood pressure is 100/64. HEART:  Regular rate and rhythm. LUNGS:  Clear bilaterally. ABDOMEN:  Soft, nontender, nondistended.  No rebound or guarding. PELVIC:  Shows normal external female genitalia.  Vagina appears normal. Uterus is mobile, normal size.  No adnexal masses or tenderness noted.  Ultrasound was performed showing a 2.5 cm simple cyst on the left ovary, no intracavitary masses were noted.  Endometrial lining measures 2.9 mm.  ASSESSMENT:  Menorrhagia.  Jennifer [name] has a history of migraine headaches with aura and a patent foramen ovale that was repaired as a child, therefore, she is not a candidate for estrogen-containing oral contraceptive pills.  She has been unable to tolerate progestin only pills in the past.  She elects to move forward with surgical management and desires permanent sterilization.  PLAN: 1. Hysteroscopy, D and C, with Novasure ablation. 2. Laparoscopic tubal with Filshie clips.     Zelphia Cairo, MD     GA/MEDQ  D:  10/24/2010  T:  10/24/2010  Job:  [id number]

## 2010-10-25 MED ORDER — DEXTROSE 5 % IV SOLN
1.0000 g | INTRAVENOUS | Status: AC
  Administered 2010-10-26: 1 g via INTRAVENOUS
  Filled 2010-10-25: qty 1

## 2010-10-26 ENCOUNTER — Ambulatory Visit (HOSPITAL_COMMUNITY): Payer: 59 | Admitting: Anesthesiology

## 2010-10-26 ENCOUNTER — Encounter (HOSPITAL_COMMUNITY): Payer: Self-pay | Admitting: Anesthesiology

## 2010-10-26 ENCOUNTER — Ambulatory Visit (HOSPITAL_COMMUNITY)
Admission: RE | Admit: 2010-10-26 | Discharge: 2010-10-26 | Disposition: A | Discharge status: Home / Self Care | Payer: 59 | Source: Ambulatory Visit | Attending: Obstetrics and Gynecology | Admitting: Obstetrics and Gynecology

## 2010-10-26 ENCOUNTER — Other Ambulatory Visit: Payer: Self-pay | Admitting: Obstetrics and Gynecology

## 2010-10-26 ENCOUNTER — Encounter (HOSPITAL_COMMUNITY)
Admission: RE | Disposition: A | Discharge status: Home / Self Care | Payer: Self-pay | Source: Ambulatory Visit | Attending: Obstetrics and Gynecology

## 2010-10-26 DIAGNOSIS — Z302 Encounter for sterilization: Secondary | ICD-10-CM | POA: Insufficient documentation

## 2010-10-26 DIAGNOSIS — N920 Excessive and frequent menstruation with regular cycle: Secondary | ICD-10-CM | POA: Insufficient documentation

## 2010-10-26 HISTORY — PX: LAPAROSCOPIC TUBAL LIGATION: SHX1937

## 2010-10-26 LAB — TYPE AND SCREEN

## 2010-10-26 SURGERY — LIGATION, FALLOPIAN TUBE, LAPAROSCOPIC
Anesthesia: General | Site: Vagina | Wound class: Clean Contaminated

## 2010-10-26 MED ORDER — ROCURONIUM BROMIDE 100 MG/10ML IV SOLN
INTRAVENOUS | Status: DC | PRN
  Administered 2010-10-26: 25 mg via INTRAVENOUS
  Administered 2010-10-26: 5 mg via INTRAVENOUS

## 2010-10-26 MED ORDER — MIDAZOLAM HCL 5 MG/5ML IJ SOLN
INTRAMUSCULAR | Status: DC | PRN
  Administered 2010-10-26: 2 mg via INTRAVENOUS

## 2010-10-26 MED ORDER — LIDOCAINE HCL (CARDIAC) 20 MG/ML IV SOLN
INTRAVENOUS | Status: AC
  Filled 2010-10-26: qty 5

## 2010-10-26 MED ORDER — LIDOCAINE HCL 1 % IJ SOLN
INTRAMUSCULAR | Status: DC | PRN
  Administered 2010-10-26: 10 mL

## 2010-10-26 MED ORDER — GLYCOPYRROLATE 0.2 MG/ML IJ SOLN
INTRAMUSCULAR | Status: DC | PRN
  Administered 2010-10-26: .4 mg via INTRAVENOUS

## 2010-10-26 MED ORDER — NEOSTIGMINE METHYLSULFATE 1 MG/ML IJ SOLN
INTRAMUSCULAR | Status: DC | PRN
  Administered 2010-10-26: 2 mg via INTRAMUSCULAR

## 2010-10-26 MED ORDER — DEXAMETHASONE SODIUM PHOSPHATE 10 MG/ML IJ SOLN
INTRAMUSCULAR | Status: AC
  Filled 2010-10-26: qty 1

## 2010-10-26 MED ORDER — PROPOFOL 10 MG/ML IV EMUL
INTRAVENOUS | Status: AC
  Filled 2010-10-26: qty 20

## 2010-10-26 MED ORDER — IBUPROFEN 800 MG PO TABS: 800.0000 mg | ORAL_TABLET | Freq: Three times a day (TID) | ORAL | Status: AC | PRN

## 2010-10-26 MED ORDER — LIDOCAINE HCL (CARDIAC) 20 MG/ML IV SOLN
INTRAVENOUS | Status: DC | PRN
  Administered 2010-10-26: 60 mg via INTRAVENOUS

## 2010-10-26 MED ORDER — ONDANSETRON HCL 4 MG/2ML IJ SOLN
INTRAMUSCULAR | Status: DC | PRN
  Administered 2010-10-26: 4 mg via INTRAVENOUS

## 2010-10-26 MED ORDER — METOCLOPRAMIDE HCL 5 MG/ML IJ SOLN
10.0000 mg | Freq: Once | INTRAMUSCULAR | Status: AC
  Administered 2010-10-26: 10 mg via INTRAVENOUS

## 2010-10-26 MED ORDER — FENTANYL CITRATE 0.05 MG/ML IJ SOLN
INTRAMUSCULAR | Status: AC
  Filled 2010-10-26: qty 5

## 2010-10-26 MED ORDER — ROCURONIUM BROMIDE 50 MG/5ML IV SOLN
INTRAVENOUS | Status: AC
  Filled 2010-10-26: qty 1

## 2010-10-26 MED ORDER — MIDAZOLAM HCL 2 MG/2ML IJ SOLN
INTRAMUSCULAR | Status: AC
  Filled 2010-10-26: qty 2

## 2010-10-26 MED ORDER — FENTANYL CITRATE 0.05 MG/ML IJ SOLN
INTRAMUSCULAR | Status: AC
  Filled 2010-10-26: qty 2

## 2010-10-26 MED ORDER — FENTANYL CITRATE 0.05 MG/ML IJ SOLN
INTRAMUSCULAR | Status: DC | PRN
  Administered 2010-10-26: 50 ug via INTRAVENOUS
  Administered 2010-10-26: 100 ug via INTRAVENOUS
  Administered 2010-10-26: 50 ug via INTRAVENOUS
  Administered 2010-10-26: 100 ug via INTRAVENOUS
  Administered 2010-10-26: 50 ug via INTRAVENOUS

## 2010-10-26 MED ORDER — HYDROCODONE-ACETAMINOPHEN 5-500 MG PO TABS: 1.0000 | ORAL_TABLET | Freq: Four times a day (QID) | ORAL | Status: AC | PRN

## 2010-10-26 MED ORDER — BUPIVACAINE HCL (PF) 0.25 % IJ SOLN
INTRAMUSCULAR | Status: DC | PRN
  Administered 2010-10-26: 10 mL

## 2010-10-26 MED ORDER — FENTANYL CITRATE 0.05 MG/ML IJ SOLN
25.0000 ug | INTRAMUSCULAR | Status: DC | PRN
  Administered 2010-10-26: 50 ug via INTRAVENOUS
  Administered 2010-10-26 (×2): 25 ug via INTRAVENOUS

## 2010-10-26 MED ORDER — PROPOFOL 10 MG/ML IV EMUL
INTRAVENOUS | Status: DC | PRN
  Administered 2010-10-26: 160 mg via INTRAVENOUS

## 2010-10-26 MED ORDER — KETOROLAC TROMETHAMINE 30 MG/ML IJ SOLN: 15.0000 mg | Freq: Once | INTRAMUSCULAR | Status: DC | PRN

## 2010-10-26 MED ORDER — ONDANSETRON HCL 4 MG/2ML IJ SOLN
INTRAMUSCULAR | Status: AC
  Filled 2010-10-26: qty 2

## 2010-10-26 MED ORDER — HYDROMORPHONE HCL 1 MG/ML IJ SOLN
0.5000 mg | Freq: Once | INTRAMUSCULAR | Status: AC
  Administered 2010-10-26: 1 mg via INTRAVENOUS

## 2010-10-26 MED ORDER — DEXAMETHASONE SODIUM PHOSPHATE 10 MG/ML IJ SOLN
INTRAMUSCULAR | Status: DC | PRN
  Administered 2010-10-26: 10 mg via INTRAVENOUS

## 2010-10-26 MED ORDER — LACTATED RINGERS IV SOLN
INTRAVENOUS | Status: DC
  Administered 2010-10-26: 1000 mL via INTRAVENOUS
  Administered 2010-10-26: 08:00:00 via INTRAVENOUS

## 2010-10-26 SURGICAL SUPPLY — 26 items
ABLATOR ENDOMETRIAL BIPOLAR (ABLATOR) ×3 IMPLANT
CATH ROBINSON RED A/P 16FR (CATHETERS) ×3 IMPLANT
CHLORAPREP W/TINT 26ML (MISCELLANEOUS) ×6 IMPLANT
CLIP FILSHIE TUBAL LIGA STRL (Clip) ×1 IMPLANT
CLOTH BEACON ORANGE TIMEOUT ST (SAFETY) ×3 IMPLANT
CONTAINER PREFILL 10% NBF 60ML (FORM) IMPLANT
DERMABOND ADVANCED (GAUZE/BANDAGES/DRESSINGS) ×3 IMPLANT
GLOVE BIO SURGEON STRL SZ 6.5 (GLOVE) ×3 IMPLANT
GLOVE BIOGEL PI IND STRL 7.0 (GLOVE) ×4 IMPLANT
GLOVE BIOGEL PI INDICATOR 7.0 (GLOVE) ×3
GOWN PREVENTION PLUS LG XLONG (DISPOSABLE) ×6 IMPLANT
NDL SPNL 22GX3.5 QUINCKE BK (NEEDLE) ×2 IMPLANT
NEEDLE SPNL 22GX3.5 QUINCKE BK (NEEDLE) ×3 IMPLANT
PACK HYSTEROSCOPY LF (CUSTOM PROCEDURE TRAY) ×3 IMPLANT
PACK LAPAROSCOPY BASIN (CUSTOM PROCEDURE TRAY) ×3 IMPLANT
SET IRRIG TUBING LAPAROSCOPIC (IRRIGATION / IRRIGATOR) IMPLANT
SLEEVE Z-THREAD 5X100MM (TROCAR) IMPLANT
SUT VIC AB 3-0 PS2 18 (SUTURE) ×3
SUT VIC AB 3-0 PS2 18XBRD (SUTURE) ×2 IMPLANT
SUT VICRYL 0 UR6 27IN ABS (SUTURE) ×3 IMPLANT
SYR CONTROL 10ML LL (SYRINGE) ×3 IMPLANT
TOWEL OR 17X24 6PK STRL BLUE (TOWEL DISPOSABLE) ×6 IMPLANT
TROCAR Z-THREAD BLADED 5X100MM (TROCAR) IMPLANT
TROCAR Z-THREAD FIOS 11X100 BL (TROCAR) ×3 IMPLANT
WARMER LAPAROSCOPE (MISCELLANEOUS) ×3 IMPLANT
WATER STERILE IRR 1000ML POUR (IV SOLUTION) ×3 IMPLANT

## 2010-10-26 NOTE — Op Note (Signed)
[name], Jennifer [name] NO.:  [id number]  MEDICAL RECORD NO.:  [id number]  LOCATION:  [facility]                          [facility]ILITY:  [facility]  PHYSICIAN:  Zelphia Cairo, MD    DATE OF BIRTH:  1965-12-27  DATE OF PROCEDURE:  10/26/2010 DATE OF DISCHARGE:                              OPERATIVE REPORT   PREOPERATIVE DIAGNOSES: 1. Desires permanent sterilization. 2. Menorrhagia.  POSTOPERATIVE DIAGNOSES: 1. Desires permanent sterilization. 2. Menorrhagia.  PROCEDURE: 1. Laparoscopic bilateral tubal ligation with Filshie clips. 2. Cervical block. 3. Hysteroscopy. 4. D and C. 5. NovaSure ablation.  SURGEON:  Zelphia Cairo, MD  ANESTHESIA:  General.  FLUID DEFICIT:  5 mL.  ESTIMATED BLOOD LOSS:  Minimal.  COMPLICATIONS:  None.  CONDITION:  Stable to recovery room.  SPECIMENS:  Endometrial curettings.  PROCEDURES:  Ghadeer was taken to the operating room after informed consent was obtained.  She was given general anesthesia and placed in the dorsal lithotomy position using Allen stirrups.  She was prepped and draped in sterile fashion.  In-and-out catheter was used to drain her bladder.  Bivalve speculum was placed in the vagina and the Hulka clamp was placed on the cervix.  Speculum was removed and our attention was turned to the abdomen.  Local anesthesia was injected at the site of her infraumbilical skin incision and a scalpel was used to make a small infraumbilical skin incision.  This was extended bluntly to the level of the fascia using a Kelly clamp and the optical trocar was then inserted under direct visualization.  Once intraperitoneal placement was confirmed, CO2 was turned on and the abdomen and pelvis were insufflated.  Bilateral fallopian tubes and ovaries appeared normal.  Uterus appeared normal. Filshie clips were placed on the portion of bilateral fallopian tubes. Good blanching was noted bilaterally.  Once this procedure was completed,  trocars and instruments were removed from the abdomen. Fascia was reapproximated using a figure-of-eight stitch and the skin was reapproximated with an interrupted suture.  Dermabond was placed over the abdominal incision and our attention was turned to the vagina.  Hulka clamp was removed and a speculum was placed in the vagina.  1 mL of 1% Xylocaine was placed at 12 o'clock of the cervix and a single- tooth tenaculum was placed on the anterior lip of the cervix.  9 mL was then injected as a cervical block.  The cervix was serially dilated using Pratt dilators.  The diagnostic hysteroscope was inserted into the endometrial cavity.  No masses or abnormalities were noted.  The hysteroscope was then removed and a gentle curetting performed. Specimen was placed on Telfa and passed off to be sent to pathology. The NovaSure device was then inserted into the uterine cavity and the NovaSure ablation was performed using standard manufacture guidelines. Once the cycle was complete, the device was allowed the cool and the NovaSure was removed.  The tenaculum was then removed from the cervix. Pressure was applied to the tenaculum site.  Once hemostasis was assured, the patient was taken to the recovery room in stable condition. Sponge lap, needle and instrument counts were correct x2     Zelphia Cairo, MD  GA/MEDQ  D:  10/26/2010  T:  10/26/2010  Job:  [id number]

## 2010-10-26 NOTE — Transfer of Care (Signed)
  Anesthesia Post-op Note  Patient: Jennifer [name]  Procedure(s) Performed:  LAPAROSCOPIC TUBAL LIGATION; DILATATION & CURETTAGE/HYSTEROSCOPY WITH NOVASURE ABLATION  Patient Location: PACU  Anesthesia Type: General  Level of Consciousness: awake, alert , oriented, patient cooperative and responds to stimulation  Airway and Oxygen Therapy: Patient Spontanous Breathing and Patient connected to nasal cannula oxygen  Post-op Pain: 8 /10  After 3 additional cc of Fentanyl pt reports no decrease in pain score. However respirations are 14 and patient sleeping at intervals.  Post-op Assessment: Post-op Vital signs reviewed and Patient's Cardiovascular Status Stable  Post-op Vital Signs: Reviewed and stable  Complications: No apparent anesthesia complications

## 2010-10-26 NOTE — Anesthesia Postprocedure Evaluation (Signed)
Anesthesia Post Note  Patient: Jennifer [name]  Procedure(s) Performed:  LAPAROSCOPIC TUBAL LIGATION; DILATATION & CURETTAGE/HYSTEROSCOPY WITH NOVASURE ABLATION  Anesthesia type: General  Patient location: PACU  Post pain: Pain level controlled  Post assessment: Post-op Vital signs reviewed  Last Vitals:  Filed Vitals:   10/26/10 1015  BP: 107/69  Pulse: 97  Temp: 98.4 F (36.9 C)  Resp:     Post vital signs: Reviewed  Level of consciousness: sedated  Complications: No apparent anesthesia complications

## 2010-10-26 NOTE — Anesthesia Procedure Notes (Signed)
Procedure Name: Intubation Date/Time: 10/26/2010 7:37 AM Performed by: Cephus Shelling Pre-anesthesia Checklist: Patient identified, Patient being monitored, Emergency Drugs available and Suction available Patient Re-evaluated:Patient Re-evaluated prior to inductionOxygen Delivery Method: Circle System Utilized Preoxygenation: Pre-oxygenation with 100% oxygen Intubation Type: IV induction, Inhalational induction and Circoid Pressure applied Ventilation: Mask ventilation without difficulty Laryngoscope Size: Mac and 3 Grade View: Grade II Tube type: Oral Tube size: 7.0 mm Number of attempts: 1 Placement Confirmation: ETT inserted through vocal cords under direct vision,  positive ETCO2,  CO2 detector and breath sounds checked- equal and bilateral Secured at: 20 cm Tube secured with: Tape Dental Injury: Teeth and Oropharynx as per pre-operative assessment

## 2010-10-26 NOTE — Op Note (Signed)
10/26/2010  8:18 AM  PATIENT:  Jennifer [name]  45 y.o. female  PRE-OPERATIVE DIAGNOSIS:  desire sterilization, menorrhagia  POST-OPERATIVE DIAGNOSIS:  same  PROCEDURE:  Procedure(s): LAPAROSCOPIC TUBAL LIGATION DILATATION & CURETTAGE/HYSTEROSCOPY WITH NOVASURE ABLATION Cervical block  SURGEON:  Surgeon(s): Zelphia Cairo  ANESTHESIA:   general  ESTIMATED BLOOD LOSS: minimal  SPECIMEN:  EMC  DISPOSITION OF SPECIMEN:  PATHOLOGY  COUNTS:  YES  DICTATION #: [id number]  PLAN OF CARE: dc home  PATIENT DISPOSITION:  PACU - hemodynamically stable.   Delay start of Pharmacological VTE agent (>24hrs) due to surgical blood loss or risk of bleeding:  no

## 2010-10-26 NOTE — OR Nursing (Signed)
Dilatation and currettage with hysterosocopy with novasure started at 800am

## 2010-11-10 ENCOUNTER — Encounter: Payer: 59 | Admitting: Oncology

## 2010-11-10 LAB — CBC WITH DIFFERENTIAL/PLATELET
Basophils Absolute: 0 10*3/uL (ref 0.0–0.1)
Eosinophils Absolute: 0.1 10*3/uL (ref 0.0–0.5)
HGB: 12 g/dL (ref 11.6–15.9)
LYMPH%: 23.8 % (ref 14.0–49.7)
MCV: 100.6 fL (ref 79.5–101.0)
MONO%: 5.6 % (ref 0.0–14.0)
NEUT#: 3.2 10*3/uL (ref 1.5–6.5)
Platelets: 164 10*3/uL (ref 145–400)
RBC: 3.48 10*6/uL — ABNORMAL LOW (ref 3.70–5.45)

## 2010-11-10 LAB — FERRITIN: Ferritin: 58 ng/mL (ref 10–291)

## 2010-11-10 LAB — COMPREHENSIVE METABOLIC PANEL
Alkaline Phosphatase: 61 U/L (ref 39–117)
BUN: 9 mg/dL (ref 6–23)
Creatinine, Ser: 0.78 mg/dL (ref 0.50–1.10)
Glucose, Bld: 113 mg/dL — ABNORMAL HIGH (ref 70–99)
Total Bilirubin: 0.5 mg/dL (ref 0.3–1.2)

## 2010-11-14 ENCOUNTER — Encounter (HOSPITAL_COMMUNITY): Payer: Self-pay | Admitting: Obstetrics and Gynecology

## 2010-11-24 LAB — DIFFERENTIAL
Basophils Absolute: 0
Basophils Relative: 1
Eosinophils Absolute: 0.1
Eosinophils Relative: 2
Lymphocytes Relative: 34
Lymphs Abs: 1.6
Monocytes Absolute: 0.4
Monocytes Relative: 8
Neutro Abs: 2.7
Neutrophils Relative %: 55

## 2010-11-24 LAB — BASIC METABOLIC PANEL WITH GFR
BUN: 7
CO2: 22
Calcium: 8.7
Creatinine, Ser: 0.84
GFR calc non Af Amer: >60
Glucose, Bld: 93
Sodium: 138

## 2010-11-24 LAB — CBC
HCT: 36.6
Hemoglobin: 12.3
MCHC: 33.7
MCV: 99.3
Platelets: 170
RBC: 3.68 — ABNORMAL LOW
RDW: 12.4
WBC: 4.9

## 2010-11-24 LAB — POCT CARDIAC MARKERS
Myoglobin, poc: 38.9
Myoglobin, poc: 40
Operator id: 198171
Troponin i, poc: <0.05

## 2010-11-24 LAB — BASIC METABOLIC PANEL
Chloride: 111
GFR calc Af Amer: >60
Potassium: 3.8

## 2010-12-11 ENCOUNTER — Encounter (HOSPITAL_BASED_OUTPATIENT_CLINIC_OR_DEPARTMENT_OTHER): Payer: 59 | Admitting: Oncology

## 2011-09-27 ENCOUNTER — Telehealth: Payer: Self-pay | Admitting: *Deleted

## 2011-09-27 ENCOUNTER — Telehealth: Payer: Self-pay | Admitting: Oncology

## 2011-09-27 NOTE — Telephone Encounter (Signed)
Pt notified of appt dates & times in OCT. 2013.

## 2011-09-27 NOTE — Telephone Encounter (Signed)
lmonvm (cell) for pt re appt for 10/7 and 10/14. Schedule mailed. Per relative who answered at pt's home call her on her cell.

## 2011-12-10 ENCOUNTER — Other Ambulatory Visit: Payer: 59 | Admitting: Lab

## 2011-12-10 ENCOUNTER — Other Ambulatory Visit: Payer: Self-pay | Admitting: Oncology

## 2011-12-13 ENCOUNTER — Telehealth: Payer: Self-pay | Admitting: Oncology

## 2011-12-13 NOTE — Telephone Encounter (Signed)
Talked to patient, gave her lab appt for 12/14/11 4pm, missed lab  appt

## 2011-12-14 ENCOUNTER — Other Ambulatory Visit: Payer: 59 | Admitting: Lab

## 2011-12-17 ENCOUNTER — Ambulatory Visit: Payer: 59 | Admitting: Oncology

## 2011-12-17 ENCOUNTER — Encounter: Payer: Self-pay | Admitting: Oncology

## 2011-12-17 NOTE — Progress Notes (Signed)
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 She actually runs a low ferritin. She has not required phlebotomy program. I am seeing her on an annual basis. She failed to report for her visit today. She will be rescheduled.

## 2011-12-20 ENCOUNTER — Telehealth: Payer: Self-pay | Admitting: Oncology

## 2011-12-20 NOTE — Telephone Encounter (Signed)
Pt left message, called pt back and r/s missed appt to December , nurse notified

## 2012-01-30 ENCOUNTER — Other Ambulatory Visit (HOSPITAL_BASED_OUTPATIENT_CLINIC_OR_DEPARTMENT_OTHER): Payer: 59 | Admitting: Lab

## 2012-01-30 LAB — CBC & DIFF AND RETIC
BASO%: 0.3 % (ref 0.0–2.0)
EOS%: 0.3 % (ref 0.0–7.0)
LYMPH%: 29.7 % (ref 14.0–49.7)
MCH: 32.8 pg (ref 25.1–34.0)
MCHC: 33.1 g/dL (ref 31.5–36.0)
MCV: 99.1 fL (ref 79.5–101.0)
MONO#: 0.3 10*3/uL (ref 0.1–0.9)
MONO%: 4.2 % (ref 0.0–14.0)
Platelets: 166 10*3/uL (ref 145–400)
RBC: 3.41 10*6/uL — ABNORMAL LOW (ref 3.70–5.45)
Retic %: 0.97 % (ref 0.70–2.10)
WBC: 6.4 10*3/uL (ref 3.9–10.3)

## 2012-01-30 LAB — COMPREHENSIVE METABOLIC PANEL (CC13)
Alkaline Phosphatase: 74 U/L (ref 40–150)
BUN: 8 mg/dL (ref 7.0–26.0)
CO2: 24 mEq/L (ref 22–29)
Creatinine: 0.8 mg/dL (ref 0.6–1.1)
Glucose: 90 mg/dl (ref 70–99)
Total Bilirubin: 0.27 mg/dL (ref 0.20–1.20)

## 2012-01-30 LAB — LACTATE DEHYDROGENASE (CC13): LDH: 157 U/L (ref 125–245)

## 2012-01-30 LAB — FERRITIN: Ferritin: 52 ng/mL (ref 10–291)

## 2012-02-04 ENCOUNTER — Telehealth: Payer: Self-pay | Admitting: Oncology

## 2012-02-04 ENCOUNTER — Ambulatory Visit (HOSPITAL_BASED_OUTPATIENT_CLINIC_OR_DEPARTMENT_OTHER): Payer: 59 | Admitting: Oncology

## 2012-02-04 ENCOUNTER — Encounter: Payer: Self-pay | Admitting: Oncology

## 2012-02-04 VITALS — BP 105/67 | HR 98 | Temp 97.6°F | Resp 18 | Ht 67.0 in | Wt 135.8 lb

## 2012-02-04 DIAGNOSIS — M069 Rheumatoid arthritis, unspecified: Secondary | ICD-10-CM

## 2012-02-04 DIAGNOSIS — Z862 Personal history of diseases of the blood and blood-forming organs and certain disorders involving the immune mechanism: Secondary | ICD-10-CM

## 2012-02-04 HISTORY — DX: Rheumatoid arthritis, unspecified: M06.9

## 2012-02-04 NOTE — Progress Notes (Signed)
Hematology and Oncology Follow Up Visit  Jennifer [name] [id number] 12/25/1965 46 y.o. 02/04/2012 6:13 PM   Principle Diagnosis: Encounter Diagnoses  Name Primary?  . Rheumatoid arthritis   . HEMOCHROMATOSIS, HX OF Yes     Interim History:   Followup visit for this pleasant 46 year old woman with a strong family history of hemochromatosis.I have already evaluated her mother, Jennifer [name], and her sister, Jennifer [name].  Mrs. 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  Her husband was a heterozygote.  All of their children tested so far are homozygotes.  Of note, nobody has had significant elevations of the serum ferritins and gotten into any problems clinically except for the brother of Mrs. Jennifer [name], who has developed end-stage liver disease and has required a liver transplant. He recently expired.     Gene testing on Jennifer [name] showed homozygosity for the C282Y gene done 01/18/09. At time of her first visit here on 11/09/09, a ferritin level was actually low at 38 (10-291) with a serum iron 130, percent saturation 47.  Given the normal ferritin and normal liver functions, I did not feel that she needed to be on a phlebotomy program.     Jennifer [name] has three children;  two from her first husband, boys aged 33 and 60, and both have been tested and are heterozygotes.  She has a 25-year old-old boy from her 2nd husband who has not yet been tested.   .      She had an Jennifer [name] for menorrhagia along with a BTL back in August, 2012.  Ferritin levels have remained stable despite cessation of her periods.  She has had ongoing problems with progressive pain in the joints of her hands, wrists, and ankles.She has seen 2 rheumatologists, first Dr. Azzie Roup, and more recently Dr. Vicki Mallet.  , She has a very high rheumatoid factor by her history.  She was treated by Dr. Dareen Piano for a number of months with a variety of different agents including methotrexate.  She  tolerated these treatments poorly, and they only made her feel worse than prior to any treatment at all.  Subsequent reevaluation by Dr. Kellie Simmering.  He stopped all treatments, and she initially felt  much better but symptoms have recurred with a vengeance. She recently saw another Rheumatologist, Dr Magda Bernheim. He has started her on parenteral methotrexate in view of previous poor tolerance of oral MTX.  She has lost 5 pounds since visit here last year and is not trying to lose.   Medications: reviewed  Allergies:  Allergies  Allergen Reactions  . Morphine And Related Rash    Rxn to IV morphine; has never had PO morphine  . Sulfonamide Derivatives Rash    Review of Systems: Constitutional:   See above Respiratory: No cough or dyspnea Cardiovascular:  No chest pain or palpitations Gastrointestinal: No abdominal symptoms Genito-Urinary: Low-grade spotting but her periods have stopped since Jennifer ablation Musculoskeletal: In addition to arthralgias, she also has polymyalgias Neurologic: No headache or change in vision. Of note, one of her sisters has a idiopathic progressive neurologic disorder Skin: No rash Remaining ROS negative.  Physical Exam: Blood pressure 105/67, pulse 98, temperature 97.6 F (36.4 C), temperature source Oral, resp. rate 18, height 5\' 7"  (1.702 m), weight 135 lb 12.8 oz (61.598 kg). Wt Readings from Last 3 Encounters:  02/04/12 135 lb 12.8 oz (61.598 kg)  10/19/10 141 lb (63.957 kg)  07/27/09 171 lb (77.565 kg)  General appearance: Thin Caucasian woman HENNT: Pharynx no erythema or exudate Lymph nodes: No adenopathy Breasts: Lungs: Clear to auscultation resonant to percussion Heart: Regular rhythm no murmur Abdomen: Soft, nontender, no mass, no organomegaly Extremities: No edema, no calf tenderness Musculoskeletal: Some joint swelling but no gross joint deformities of her fingers Vascular: No cyanosis Neurologic: Motor strength 5 over 5,  reflexes 1+ symmetric, sensation intact to vibration by tuning fork exam over the fingertips Skin: No rash or ecchymosis  Lab Results: Lab Results  Component Value Date   WBC 6.4 01/30/2012   HGB 11.2* 01/30/2012   HCT 33.8* 01/30/2012   MCV 99.1 01/30/2012   PLT 166 01/30/2012     Chemistry      Component Value Date/Time   NA 142 01/30/2012 1356   NA 137 11/10/2010 0821   K 4.3 01/30/2012 1356   K 3.4* 11/10/2010 0821   CL 111* 01/30/2012 1356   CL 115* 11/10/2010 0821   CO2 24 01/30/2012 1356   CO2 21 11/10/2010 0821   BUN 8.0 01/30/2012 1356   BUN 9 11/10/2010 0821   CREATININE 0.8 01/30/2012 1356   CREATININE 0.78 11/10/2010 0821      Component Value Date/Time   CALCIUM 8.8 01/30/2012 1356   CALCIUM 8.5 11/10/2010 0821   ALKPHOS 74 01/30/2012 1356   ALKPHOS 61 11/10/2010 0821   AST 14 01/30/2012 1356   AST 13 11/10/2010 0821   ALT 13 01/30/2012 1356   ALT 11 11/10/2010 0821   BILITOT 0.27 01/30/2012 1356   BILITOT 0.5 11/10/2010 0821    Ferritin: 52 units drawn on 01/30/2012 and this compares with 58 on 11/10/10 and 38 on 11/03/09  Impression: #1. 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 No indication for phlebotomy at this [name]. I recommended checking her ferritin twice a year. Now that she has had an Jennifer ablation and is no longer having periods, ferritin may increase over time.  #2. Symptomatic rheumatoid arthritis in a young woman I'm surprised that her ferritin is low in the face of inflammatory arthritis. She will continue management per her rheumatologist as outlined above. Currently on a trial of parenteral methotrexate.  CC:. Jennifer Red PA/Dr. Marlette-[name]; Dr. Marveen [name]. Junior-rheumatology-[name]   Jennifer Feinstein, MD 12/2/20136:13 PM

## 2012-02-04 NOTE — Patient Instructions (Signed)
Return in 1 year to check ferritin level and liver chemistries Consider checking ferritin with your primary care MD 6 months from now

## 2012-02-04 NOTE — Telephone Encounter (Signed)
gv and pritned appt schedule for pt for NOV 2014

## 2012-05-16 ENCOUNTER — Ambulatory Visit: Payer: 59 | Admitting: Internal Medicine

## 2012-06-03 ENCOUNTER — Telehealth: Payer: Self-pay | Admitting: Oncology

## 2012-06-03 NOTE — Telephone Encounter (Signed)
Called pt & reported to pt per Dr Cyndie Chime that he does not see an indication for phlebotomy & states that the iron sat is always high & her ferritin was low.  Asked her to have Dr Kinnie Scales to send copy of labs to Dr Cyndie Chime. She sees him this pm.

## 2012-06-03 NOTE — Telephone Encounter (Signed)
Pt called and wants an iron infusion, labs were drawn @ Dr. Kinnie Scales office, called transferred to nurse

## 2012-06-03 NOTE — Telephone Encounter (Signed)
Pt called & reports that she had labs last week per dr. Kinnie Scales & iron sat was 92% & he recommends phlebotomy immediately.  She has an appt with Dr Kinnie Scales today.   Note to Dr Cyndie Chime.

## 2012-06-14 ENCOUNTER — Other Ambulatory Visit: Payer: Self-pay | Admitting: Oncology

## 2012-06-14 DIAGNOSIS — Z862 Personal history of diseases of the blood and blood-forming organs and certain disorders involving the immune mechanism: Secondary | ICD-10-CM

## 2012-06-16 ENCOUNTER — Telehealth: Payer: Self-pay | Admitting: Oncology

## 2012-06-18 ENCOUNTER — Other Ambulatory Visit: Payer: 59 | Admitting: Lab

## 2012-06-19 ENCOUNTER — Telehealth: Payer: Self-pay | Admitting: Oncology

## 2012-06-20 ENCOUNTER — Other Ambulatory Visit: Payer: 59

## 2012-06-27 ENCOUNTER — Telehealth: Payer: Self-pay | Admitting: Oncology

## 2012-07-01 ENCOUNTER — Other Ambulatory Visit (HOSPITAL_BASED_OUTPATIENT_CLINIC_OR_DEPARTMENT_OTHER): Payer: 59

## 2012-07-01 DIAGNOSIS — Z862 Personal history of diseases of the blood and blood-forming organs and certain disorders involving the immune mechanism: Secondary | ICD-10-CM

## 2012-07-01 LAB — FERRITIN: Ferritin: 84 ng/mL (ref 10–291)

## 2012-07-10 ENCOUNTER — Telehealth: Payer: Self-pay | Admitting: *Deleted

## 2012-07-10 NOTE — Telephone Encounter (Signed)
Pt called for results of ferritin & questioned if she needed a phlebotomy.  Result within normal range but will discuss with Dr Cyndie Chime.  Call back # is [phone].

## 2012-07-21 ENCOUNTER — Inpatient Hospital Stay (HOSPITAL_COMMUNITY)
Admission: RE | Admit: 2012-07-21 | Discharge: 2012-07-24 | DRG: 885 | Disposition: A | Discharge status: Home / Self Care | Payer: 59 | Attending: Psychiatry | Admitting: Psychiatry

## 2012-07-21 ENCOUNTER — Encounter (HOSPITAL_COMMUNITY): Payer: Self-pay | Admitting: *Deleted

## 2012-07-21 DIAGNOSIS — R45851 Suicidal ideations: Secondary | ICD-10-CM

## 2012-07-21 DIAGNOSIS — F411 Generalized anxiety disorder: Secondary | ICD-10-CM | POA: Diagnosis present

## 2012-07-21 DIAGNOSIS — F339 Major depressive disorder, recurrent, unspecified: Principal | ICD-10-CM | POA: Diagnosis present

## 2012-07-21 DIAGNOSIS — Z79899 Other long term (current) drug therapy: Secondary | ICD-10-CM

## 2012-07-21 DIAGNOSIS — F410 Panic disorder [episodic paroxysmal anxiety] without agoraphobia: Secondary | ICD-10-CM | POA: Diagnosis present

## 2012-07-21 MED ORDER — ALUM & MAG HYDROXIDE-SIMETH 200-200-20 MG/5ML PO SUSP: 30.0000 mL | ORAL | Status: DC | PRN

## 2012-07-21 MED ORDER — CILIDINIUM-CHLORDIAZEPOXIDE 2.5-5 MG PO CAPS
1.0000 | ORAL_CAPSULE | Freq: Four times a day (QID) | ORAL | Status: DC | PRN
  Filled 2012-07-21: qty 1

## 2012-07-21 MED ORDER — ONDANSETRON HCL 4 MG PO TABS: 4.0000 mg | ORAL_TABLET | Freq: Two times a day (BID) | ORAL | Status: DC | PRN

## 2012-07-21 MED ORDER — QUETIAPINE FUMARATE 50 MG PO TABS
75.0000 mg | ORAL_TABLET | Freq: Every day | ORAL | Status: DC
  Administered 2012-07-21 – 2012-07-23 (×3): 75 mg via ORAL
  Filled 2012-07-21 (×6): qty 1
  Filled 2012-07-21: qty 12

## 2012-07-21 MED ORDER — ALPRAZOLAM 0.5 MG PO TABS
0.5000 mg | ORAL_TABLET | Freq: Once | ORAL | Status: AC
  Administered 2012-07-21: 0.5 mg via ORAL
  Filled 2012-07-21: qty 1

## 2012-07-21 MED ORDER — TOPIRAMATE 100 MG PO TABS
300.0000 mg | ORAL_TABLET | Freq: Every day | ORAL | Status: DC
  Administered 2012-07-22 – 2012-07-24 (×3): 300 mg via ORAL
  Filled 2012-07-21 (×2): qty 3
  Filled 2012-07-21: qty 12
  Filled 2012-07-21 (×2): qty 3

## 2012-07-21 MED ORDER — ACETAMINOPHEN 325 MG PO TABS: 650.0000 mg | ORAL_TABLET | Freq: Four times a day (QID) | ORAL | Status: DC | PRN

## 2012-07-21 MED ORDER — DESVENLAFAXINE SUCCINATE ER 100 MG PO TB24
100.0000 mg | ORAL_TABLET | Freq: Every day | ORAL | Status: DC
  Administered 2012-07-22 – 2012-07-24 (×3): 100 mg via ORAL
  Filled 2012-07-21 (×3): qty 1
  Filled 2012-07-21: qty 4
  Filled 2012-07-21: qty 1

## 2012-07-21 MED ORDER — ALPRAZOLAM 0.5 MG PO TABS
0.5000 mg | ORAL_TABLET | Freq: Three times a day (TID) | ORAL | Status: DC
  Administered 2012-07-21 – 2012-07-22 (×2): 0.5 mg via ORAL
  Filled 2012-07-21 (×2): qty 1

## 2012-07-21 MED ORDER — IBUPROFEN 600 MG PO TABS
600.0000 mg | ORAL_TABLET | Freq: Two times a day (BID) | ORAL | Status: DC | PRN
  Administered 2012-07-21 – 2012-07-22 (×3): 600 mg via ORAL
  Filled 2012-07-21 (×3): qty 1

## 2012-07-21 MED ORDER — MAGNESIUM HYDROXIDE 400 MG/5ML PO SUSP: 30.0000 mL | Freq: Every day | ORAL | Status: DC | PRN
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
## 2012-07-21 NOTE — Tx Team (Signed)
Initial Interdisciplinary Treatment Plan  PATIENT STRENGTHS: (choose at least two) Ability for insight Average or above average intelligence Capable of independent living Communication skills General fund of knowledge Motivation for treatment/growth Supportive family/friends Work skills  PATIENT STRESSORS: Financial difficulties Health problems Occupational concerns   PROBLEM LIST: Problem List/Patient Goals Date to be addressed Date deferred Reason deferred Estimated date of resolution  Depression 07-21-12     Suicidal ideation 07-21-12     Anxiety d/o 07-21-12     Health Issues 07-21-12     Medication Stabilization 07-21-12                              DISCHARGE CRITERIA:  Ability to meet basic life and health needs Improved stabilization in mood, thinking, and/or behavior Medical problems require only outpatient monitoring Motivation to continue treatment in a less acute level of care Need for constant or close observation no longer present Reduction of life-threatening or endangering symptoms to within safe limits Verbal commitment to aftercare and medication compliance  PRELIMINARY DISCHARGE PLAN: Attend aftercare/continuing care group Outpatient therapy Return to previous living arrangement Return to previous work or school arrangements  PATIENT/FAMIILY INVOLVEMENT: This treatment plan has been presented to and reviewed with the patient, Jennifer [name].  The patient and family have been given the opportunity to ask questions and make suggestions.  Cranford Mon 07/21/2012, 5:56 PM

## 2012-07-22 ENCOUNTER — Encounter (HOSPITAL_COMMUNITY): Payer: Self-pay | Admitting: Psychiatry

## 2012-07-22 DIAGNOSIS — F339 Major depressive disorder, recurrent, unspecified: Secondary | ICD-10-CM | POA: Diagnosis present

## 2012-07-22 DIAGNOSIS — F411 Generalized anxiety disorder: Secondary | ICD-10-CM | POA: Diagnosis present

## 2012-07-22 DIAGNOSIS — F332 Major depressive disorder, recurrent severe without psychotic features: Secondary | ICD-10-CM

## 2012-07-22 DIAGNOSIS — F410 Panic disorder [episodic paroxysmal anxiety] without agoraphobia: Secondary | ICD-10-CM | POA: Diagnosis present

## 2012-07-22 LAB — RAPID URINE DRUG SCREEN, HOSP PERFORMED
Benzodiazepines: POSITIVE — AB
Cocaine: NOT DETECTED
Opiates: NOT DETECTED

## 2012-07-22 LAB — URINALYSIS, ROUTINE W REFLEX MICROSCOPIC
Leukocytes, UA: NEGATIVE
Nitrite: NEGATIVE
Specific Gravity, Urine: 1.011 (ref 1.005–1.030)
pH: 6.5 (ref 5.0–8.0)

## 2012-07-22 LAB — PREGNANCY, URINE: Preg Test, Ur: NEGATIVE

## 2012-07-22 MED ORDER — RAMELTEON 8 MG PO TABS
8.0000 mg | ORAL_TABLET | Freq: Every day | ORAL | Status: DC
  Administered 2012-07-22 – 2012-07-23 (×2): 8 mg via ORAL
  Filled 2012-07-22 (×3): qty 1

## 2012-07-22 MED ORDER — CLONAZEPAM 1 MG PO TABS
1.0000 mg | ORAL_TABLET | Freq: Four times a day (QID) | ORAL | Status: DC
Start: 2012-07-22 — End: 2012-07-24
  Administered 2012-07-22 – 2012-07-24 (×9): 1 mg via ORAL
  Filled 2012-07-22 (×9): qty 1

## 2012-07-22 MED ORDER — ASPIRIN-ACETAMINOPHEN-CAFFEINE 250-250-65 MG PO TABS
1.0000 | ORAL_TABLET | Freq: Four times a day (QID) | ORAL | Status: DC | PRN
Start: 2012-07-22 — End: 2012-07-24
  Administered 2012-07-22 – 2012-07-23 (×4): 1 via ORAL
  Filled 2012-07-22 (×4): qty 1

## 2012-07-22 MED ORDER — PREDNISONE 20 MG PO TABS: 20.0000 mg | ORAL_TABLET | Freq: Every day | ORAL | Status: DC

## 2012-07-22 MED ORDER — FOLIC ACID 1 MG PO TABS
1.0000 mg | ORAL_TABLET | Freq: Every day | ORAL | Status: DC
  Administered 2012-07-22 – 2012-07-24 (×3): 1 mg via ORAL
  Filled 2012-07-22 (×4): qty 1

## 2012-07-22 MED ORDER — PREDNISONE 5 MG PO TABS
5.0000 mg | ORAL_TABLET | Freq: Once | ORAL | Status: AC
  Administered 2012-07-22: 5 mg via ORAL
  Filled 2012-07-22: qty 1

## 2012-07-22 MED ORDER — PREDNISONE 5 MG PO TABS
5.0000 mg | ORAL_TABLET | Freq: Every day | ORAL | Status: DC
  Administered 2012-07-23 – 2012-07-24 (×2): 5 mg via ORAL
  Filled 2012-07-22 (×5): qty 1

## 2012-07-22 MED ORDER — GABAPENTIN 100 MG PO CAPS
200.0000 mg | ORAL_CAPSULE | Freq: Three times a day (TID) | ORAL | Status: DC
  Administered 2012-07-22 – 2012-07-24 (×7): 200 mg via ORAL
  Filled 2012-07-22 (×7): qty 2
  Filled 2012-07-22: qty 24
  Filled 2012-07-22 (×2): qty 2
  Filled 2012-07-22: qty 24
  Filled 2012-07-22: qty 2
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
## 2012-07-22 NOTE — Progress Notes (Signed)
Pt was offered to be moved to [address] but she has chosen to stay on the 300 [address] for now. She was given a schedule for the groups on [address] but has remained in groups on the 300 hall.  Dr. Dub Mikes aware.  Pt rated both her depression, hopelessness and anxiety all a 8 on her self-inventory.  Pt did give urine specimen as ordered and is the refrigerator and she is scheduled several labs to be drawn tonight.  She had prn ibuprofen at 0800 and an excedrin migraine pill at 1550 which was helpful for her pain.  She denied any S/H ideation.

## 2012-07-22 NOTE — Progress Notes (Signed)
Psychoeducational Group Note  Date:  07/22/2012 Time:  1000  Group Topic/Focus:  Therapeutic Activity-"Fact or Crap"  Participation Level: Did Not Attend  Participation Quality:  Not Applicable  Affect:  Not Applicable  Cognitive:  Not Applicable  Insight:  Not Applicable  Engagement in Group: Not Applicable  Additional Comments:  Pt remained in a consult during group.  Sharyn Lull 07/22/2012, 10:37 AM ]

## 2012-07-22 NOTE — Progress Notes (Signed)
Pt attended AA group this evening.  
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
## 2012-07-22 NOTE — [facility] Group Notes (Signed)
[facility] LCSW Group Therapy  07/22/2012  1:15 PM  Type of Therapy:  Group Therapy  Participation Level:  Active  Participation Quality:  Appropriate  Affect:  Appropriate  Cognitive:  Appropriate  Insight:  Developing/Improving  Engagement in Therapy:  Developing/Improving  Modes of Intervention:  Discussion, Education, Socialization and Support  Summary of Progress/Problems: Patient attended group presentation by staff member of  [employer] [facility] [employer] [facility] [employer] ([facility]).  Jennifer [name]  was attentive and appropriate during session.   [name]

## 2012-07-22 NOTE — [facility] Group Notes (Signed)
Adult Psychoeducational Group Note  Date:  07/22/2012 Time:  1100am Group Topic/Focus:  Recovery Goals:   The focus of this group is to identify appropriate goals for recovery and establish a plan to achieve them.  Participation Level:  Active  Participation Quality:  Appropriate and Attentive  Affect:  Appropriate  Cognitive:  Appropriate  Insight: Appropriate  Engagement in Group:  Engaged  Modes of Intervention:  Education  Additional Comments:  Staff explained to the patient that the purpose of this group is to educate them on recovery, and on setting mid-range to long-term personal goals for their recovery process.  Staff also explained that the group will also address topics including what recovery is, who goes through recovery, the first steps toward recovery, and setting realistic goals for recovery. Patient was asked to identify two areas of their life in which they would like to make a change toward recovery, and set a specific measurable goal to address each change identified. Patient was provided with a homework assignment regarding how this goal impacts their personal recovery. Staff concluded the group by encouraging the patient to take a proactive approach to accomplishing their recovery goals.  Ardelle Park O 07/22/2012, 3:35 PM

## 2012-07-22 NOTE — Progress Notes (Signed)
Recreation Therapy Notes  Date: 05.20.2014 Time: 3:00pm Location: [address] Dayroom      Group Topic/Focus: Goal Setting, Team Building  Participation Level: Active  Participation Quality: Appropriate, Attentive   Affect: Euthymic  Cognitive: Appropriate  Additional Comments: Activity: Chain Link ; Explanation: Patients were asked to break into teams. Activity was done in two rounds. Round 1 = 2 minutes, using both hands, link together as many paper clips as possible. Round 2 = 2 minutes, one hand behind patient back link as many paper clips as possible.   Patient participated in group discussion about goal setting and the appropriate way to set a goal. Patient actively participated in group activity. Patient with peer set goal in round one of linking 40 paper clips together. Patient with peer were successful at linking 52 paper clips. Patient with peer set goal in round two of 30 paper clips, patient with peer were successful at linking 25 paper clips together. Patient worked well with teammate. Patient participated in wrap up discussion about successes and failure experienced during activity.   Marykay Lex Trell Secrist, LRT/CTRS  Jearl Klinefelter 07/22/2012 5:40 PM

## 2012-07-22 NOTE — [facility] Suicide Risk Assessment (Signed)
Suicide Risk Assessment  Admission Assessment     Nursing information obtained from:    Demographic factors:    Current Mental Status:    Loss Factors:    Historical Factors:    Risk Reduction Factors:     CLINICAL FACTORS:   Panic Attacks Depression:   Insomnia Chronic Pain Medical Diagnoses and Treatments/Surgeries  COGNITIVE FEATURES THAT CONTRIBUTE TO RISK: None identified   SUICIDE RISK:   Moderate:  Frequent suicidal ideation with limited intensity, and duration, some specificity in terms of plans, no associated intent, good self-control, limited dysphoria/symptomatology, some risk factors present, and identifiable protective factors, including available and accessible social support.  PLAN OF CARE: Supportive approach/coping skills                              CBT/mindfulness                              Optimize treatment with psychotropics                              Resume Neurontin, switch to Klonopin 1 mg up QID (was on Xanax 6 mg)                                I certify that inpatient services furnished can reasonably be expected to improve the patient's condition.  Lalisa Kiehn A 07/22/2012, 6:38 PM

## 2012-07-22 NOTE — [facility] Group Notes (Signed)
[facility] LCSW Aftercare Discharge Planning Group Note   07/22/2012  8:45 PM  Participation Quality:  Appropriate  Mood/Affect:  Anxious and Depressed  Depression Rating:  8  Anxiety Rating:  8  Thoughts of Suicide:  Yes Will you contract for safety?   Yes  Current AVH:  No  Plan for Discharge/Comments:   Follow up with Dr Evelene Croon and Tod Persia  Transportation Means:  Family  Supports:  [facility] support group of professionals, family and friends  Dyane Dustman, Julious Payer

## 2012-07-23 DIAGNOSIS — F410 Panic disorder [episodic paroxysmal anxiety] without agoraphobia: Secondary | ICD-10-CM

## 2012-07-23 DIAGNOSIS — F339 Major depressive disorder, recurrent, unspecified: Principal | ICD-10-CM

## 2012-07-23 DIAGNOSIS — F411 Generalized anxiety disorder: Secondary | ICD-10-CM

## 2012-07-23 LAB — CBC WITH DIFFERENTIAL/PLATELET
Basophils Absolute: 0 10*3/uL (ref 0.0–0.1)
Basophils Relative: 1 % (ref 0–1)
Eosinophils Relative: 2 % (ref 0–5)
HCT: 37.7 % (ref 36.0–46.0)
Lymphocytes Relative: 55 % — ABNORMAL HIGH (ref 12–46)
Lymphs Abs: 3.4 10*3/uL (ref 0.7–4.0)
MCH: 33.2 pg (ref 26.0–34.0)
MCHC: 33.2 g/dL (ref 30.0–36.0)
MCV: 100.3 fL — ABNORMAL HIGH (ref 78.0–100.0)
Monocytes Absolute: 0.6 10*3/uL (ref 0.1–1.0)
RDW: 12.3 % (ref 11.5–15.5)
WBC: 6.2 10*3/uL (ref 4.0–10.5)

## 2012-07-23 LAB — COMPREHENSIVE METABOLIC PANEL
AST: 20 U/L (ref 0–37)
CO2: 26 mEq/L (ref 19–32)
Calcium: 9.1 mg/dL (ref 8.4–10.5)
Creatinine, Ser: 0.85 mg/dL (ref 0.50–1.10)
GFR calc Af Amer: >90 mL/min (ref >90)
GFR calc non Af Amer: 82 mL/min — ABNORMAL LOW (ref >90)

## 2012-07-23 LAB — T3, FREE: T3, Free: 2.6 pg/mL (ref 2.3–4.2)

## 2012-07-23 NOTE — Progress Notes (Addendum)
Patient ID: Jennifer [name], female   DOB: 19-Oct-1965, 47 y.o.   MRN: [id number] D: Pt. Reports day been "good", and anxiety at "5" out of 10. Pt. Reports being here has been helpful "hearing everybody experiences and talking to others" Pt. Reports discharge plans as returning to her therapist and is considering IOP. A: Writer introduced self to client, reviewed med administration times. Staff will monitor q25min for safety. Pt. Encouraged to attend group. R: Pt. Is safe on the unit. Pt. Attended group.

## 2012-07-23 NOTE — Progress Notes (Signed)
[facility] MD Progress Note  07/23/2012 2:52 PM Jennifer [name]  MRN:  [id number]  Subjective:  Jennifer [name] reports that she is feeling some better, Described her mood as proving. Feels that her medication is trying to work for her. Rated depression at #6 and anxiety at #7. Participated in group sessions today.  Diagnosis:   Axis I: Generalized Anxiety Disorder, Panic Disorder and Major depression, recurrent Axis II: Deferred Axis III:  Past Medical History  Diagnosis Date  . Headache     migraines  . Orthostatic syncope   . Arthritis     inactive RA- no meds  . IBS (irritable bowel syndrome)   . Dysrhythmia     PVCs  . Depression   . Anxiety   . Hemochromatosis   . Rheumatoid arthritis 02/04/2012   Axis IV: other psychosocial or environmental problems Axis V: 41-50 serious symptoms  ADL's:  Intact  Sleep: Good  Appetite:  Good  Suicidal Ideation:  Plan:  Denies Intent:  Denies Means:  Denies Homicidal Ideation:  Plan:  Denies Intent:  Denies Means:  denies  AEB (as evidenced by): Per patient reports.  Psychiatric Specialty Exam: Review of Systems  Constitutional: Negative.   HENT: Negative.   Eyes: Negative.   Respiratory: Negative.   Cardiovascular: Negative.   Gastrointestinal: Negative.   Genitourinary: Negative.   Musculoskeletal: Positive for myalgias, back pain and joint pain.  Skin: Negative.   Neurological: Negative.   Endo/Heme/Allergies: Negative.   Psychiatric/Behavioral: Positive for depression (Currently being stabilized with medication). Negative for suicidal ideas, hallucinations, memory loss and substance abuse. The patient is nervous/anxious (Currently being stabilized with medication) and has insomnia (Currently being stabilized with medication.).     Blood pressure 93/65, pulse 108, temperature 97.5 F (36.4 C), temperature source Oral, resp. rate 16, height 5\' 7"  (1.702 m), weight 60.782 kg (134 lb).Body mass index is 20.98 kg/(m^2).  General  Appearance: Casual and Fairly Groomed  Patent attorney::  Good  Speech:  Clear and Coherent  Volume:  Normal  Mood:  "Improving", rated at #6  Affect:  Congruent  Thought Process:  Coherent and Goal Directed  Orientation:  Full (Time, Place, and Person)  Thought Content:  Rumination  Suicidal Thoughts:  No  Homicidal Thoughts:  No  Memory:  Immediate;   Good Recent;   Good Remote;   Good  Judgement:  Fair  Insight:  Fair  Psychomotor Activity:  Normal  Concentration:  Good  Recall:  Good  Akathisia:  No  Handed:  Right  AIMS (if indicated):     Assets:  Desire for Improvement  Sleep:  Number of Hours: 6.25   Current Medications: Current Facility-Administered Medications  Medication Dose Route Frequency Provider Last Rate Last Dose  . alum & mag hydroxide-simeth (MAALOX/MYLANTA) 200-200-20 MG/5ML suspension 30 mL  30 mL Oral Q4H PRN Nanine Means      . aspirin-acetaminophen-caffeine (EXCEDRIN MIGRAINE) per tablet 1 tablet  1 tablet Oral Q6H PRN Sanjuana Kava, NP   1 tablet at 07/23/12 0753  . clidinium-chlordiazePOXIDE (LIBRAX) 2.5-5 MG per capsule 1 capsule  1 capsule Oral QID PRN Nanine Means      . clonazePAM (KLONOPIN) tablet 1 mg  1 mg Oral QID Rachael Fee, MD   1 mg at 07/23/12 1158  . desvenlafaxine (PRISTIQ) 24 hr tablet 100 mg  100 mg Oral Daily Jamison Lord   100 mg at 07/23/12 0752  . folic acid (FOLVITE) tablet 1 mg  1 mg Oral  Daily Sanjuana Kava, NP   1 mg at 07/23/12 1610  . gabapentin (NEURONTIN) capsule 200 mg  200 mg Oral TID Rachael Fee, MD   200 mg at 07/23/12 1158  . ibuprofen (ADVIL,MOTRIN) tablet 600 mg  600 mg Oral BID PRN Jamison Lord   600 mg at 07/22/12 0800  . magnesium hydroxide (MILK OF MAGNESIA) suspension 30 mL  30 mL Oral Daily PRN Nanine Means      . ondansetron (ZOFRAN) tablet 4 mg  4 mg Oral BID PRN Nanine Means      . predniSONE (DELTASONE) tablet 5 mg  5 mg Oral Q breakfast Rachael Fee, MD   5 mg at 07/23/12 9604  . QUEtiapine (SEROQUEL)  tablet 75 mg  75 mg Oral QHS Kerry Hough, PA-C   75 mg at 07/22/12 2157  . ramelteon (ROZEREM) tablet 8 mg  8 mg Oral QHS Rachael Fee, MD   8 mg at 07/22/12 2157  . topiramate (TOPAMAX) tablet 300 mg  300 mg Oral Daily Jamison Lord   300 mg at 07/23/12 5409    Lab Results:  Results for orders placed during the hospital encounter of 07/21/12 (from the past 48 hour(s))  PREGNANCY, URINE     Status: None   Collection Time    07/22/12 11:27 AM      Result Value Range   Preg Test, Ur NEGATIVE  NEGATIVE   Comment:            THE SENSITIVITY OF THIS     METHODOLOGY IS >20 mIU/mL.  URINALYSIS, ROUTINE W REFLEX MICROSCOPIC     Status: None   Collection Time    07/22/12 11:27 AM      Result Value Range   Color, Urine YELLOW  YELLOW   APPearance CLEAR  CLEAR   Specific Gravity, Urine 1.011  1.005 - 1.030   pH 6.5  5.0 - 8.0   Glucose, UA NEGATIVE  NEGATIVE mg/dL   Hgb urine dipstick NEGATIVE  NEGATIVE   Bilirubin Urine NEGATIVE  NEGATIVE   Ketones, ur NEGATIVE  NEGATIVE mg/dL   Protein, ur NEGATIVE  NEGATIVE mg/dL   Urobilinogen, UA 0.2  0.0 - 1.0 mg/dL   Nitrite NEGATIVE  NEGATIVE   Leukocytes, UA NEGATIVE  NEGATIVE   Comment: MICROSCOPIC NOT DONE ON URINES WITH NEGATIVE PROTEIN, BLOOD, LEUKOCYTES, NITRITE, OR GLUCOSE <1000 mg/dL.  URINE RAPID DRUG SCREEN (HOSP PERFORMED)     Status: Abnormal   Collection Time    07/22/12 11:27 AM      Result Value Range   Opiates NONE DETECTED  NONE DETECTED   Cocaine NONE DETECTED  NONE DETECTED   Benzodiazepines POSITIVE (*) NONE DETECTED   Amphetamines NONE DETECTED  NONE DETECTED   Tetrahydrocannabinol NONE DETECTED  NONE DETECTED   Barbiturates NONE DETECTED  NONE DETECTED   Comment:            DRUG SCREEN FOR MEDICAL PURPOSES     ONLY.  IF CONFIRMATION IS NEEDED     FOR ANY PURPOSE, NOTIFY LAB     WITHIN 5 DAYS.                LOWEST DETECTABLE LIMITS     FOR URINE DRUG SCREEN     Drug Class       Cutoff (ng/mL)     Amphetamine       1000     Barbiturate      200  Benzodiazepine   200     Tricyclics       300     Opiates          300     Cocaine          300     THC              50  T3, FREE     Status: None   Collection Time    07/22/12  8:11 PM      Result Value Range   T3, Free 2.6  2.3 - 4.2 pg/mL  T4, FREE     Status: None   Collection Time    07/22/12  8:11 PM      Result Value Range   Free T4 0.90  0.80 - 1.80 ng/dL  CBC WITH DIFFERENTIAL     Status: Abnormal   Collection Time    07/23/12  6:25 AM      Result Value Range   WBC 6.2  4.0 - 10.5 K/uL   RBC 3.76 (*) 3.87 - 5.11 MIL/uL   Hemoglobin 12.5  12.0 - 15.0 g/dL   HCT 16.1  09.6 - 04.5 %   MCV 100.3 (*) 78.0 - 100.0 fL   MCH 33.2  26.0 - 34.0 pg   MCHC 33.2  30.0 - 36.0 g/dL   RDW 40.9  81.1 - 91.4 %   Platelets 191  150 - 400 K/uL   Neutrophils Relative % 34 (*) 43 - 77 %   Neutro Abs 2.1  1.7 - 7.7 K/uL   Lymphocytes Relative 55 (*) 12 - 46 %   Lymphs Abs 3.4  0.7 - 4.0 K/uL   Monocytes Relative 9  3 - 12 %   Monocytes Absolute 0.6  0.1 - 1.0 K/uL   Eosinophils Relative 2  0 - 5 %   Eosinophils Absolute 0.1  0.0 - 0.7 K/uL   Basophils Relative 1  0 - 1 %   Basophils Absolute 0.0  0.0 - 0.1 K/uL  COMPREHENSIVE METABOLIC PANEL     Status: Abnormal   Collection Time    07/23/12  6:25 AM      Result Value Range   Sodium 139  135 - 145 mEq/L   Potassium 3.6  3.5 - 5.1 mEq/L   Chloride 106  96 - 112 mEq/L   CO2 26  19 - 32 mEq/L   Glucose, Bld 91  70 - 99 mg/dL   BUN 9  6 - 23 mg/dL   Creatinine, Ser 7.82  0.50 - 1.10 mg/dL   Calcium 9.1  8.4 - 95.6 mg/dL   Total Protein 6.7  6.0 - 8.3 g/dL   Albumin 3.7  3.5 - 5.2 g/dL   AST 20  0 - 37 U/L   ALT 23  0 - 35 U/L   Alkaline Phosphatase 52  39 - 117 U/L   Total Bilirubin 0.5  0.3 - 1.2 mg/dL   GFR calc non Af Amer 82 (*) >90 mL/min   GFR calc Af Amer >90  >90 mL/min   Comment:            The eGFR has been calculated     using the CKD EPI equation.     This calculation has  not been     validated in all clinical     situations.     eGFR's persistently     <90 mL/min signify  possible Chronic Kidney Disease.  TSH     Status: None   Collection Time    07/23/12  6:25 AM      Result Value Range   TSH 1.676  0.350 - 4.500 uIU/mL    Physical Findings: AIMS: Facial and Oral Movements Muscles of Facial Expression: None, normal Lips and Perioral Area: None, normal Jaw: None, normal Tongue: None, normal,Extremity Movements Upper (arms, wrists, hands, fingers): None, normal Lower (legs, knees, ankles, toes): None, normal, Trunk Movements Neck, shoulders, hips: None, normal, Overall Severity Severity of abnormal movements (highest score from questions above): None, normal Incapacitation due to abnormal movements: None, normal Patient's awareness of abnormal movements (rate only patient's report): No Awareness, Dental Status Current problems with teeth and/or dentures?: No Does patient usually wear dentures?: No  CIWA:    COWS:     Treatment Plan Summary: Daily contact with patient to assess and evaluate symptoms and progress in treatment Medication management  Plan: Supportive approach/coping skills/relapse prevention. Increased Gabapentin from 200 mg to 300 mg tid. May attend group sessions being held on the [facility]. Encouraged out of room, participation in group sessions and application of coping skills when distressed. Will continue to monitor response to/adverse effects of medications in use to assure effectiveness. Continue to monitor mood, behavior and interaction with staff and other patients. Continue current plan of care.  Medical Decision Making Problem Points:  Established problem, stable/improving (1), Review of last therapy session (1) and Review of psycho-social stressors (1) Data Points:  Review of medication regiment & side effects (2) Review of new medications or change in dosage (2)  I certify that inpatient services furnished  can reasonably be expected to improve the patient's condition.   Armandina Stammer I 07/23/2012, 2:52 PM

## 2012-07-23 NOTE — [facility] Group Notes (Signed)
[facility] LCSW Aftercare Discharge Planning Group Note   07/23/2012 8:45 AM  Participation Quality:  Appropriate  Mood/Affect:  Anxious and Depressed with Appropriate Affect  Depression Rating:  6  Anxiety Rating:  6  Thoughts of Suicide:  No Will you contract for safety?   NA  Current AVH:  No  Plan for Discharge/Comments:  Follow up with Dr Evelene Croon and current therapist.  Also explore [facility] IOP group today with group facilitator. Return home with parents and children  Transportation Means: Family    Supports: Family, friends, therapist and psychiatrist  Jennifer [name]

## 2012-07-23 NOTE — Tx Team (Signed)
Pt follows up with Dr Evelene Croon  Interdisciplinary Treatment Plan Update (Adult)  Date: 07/23/2012  Time Reviewed: 9:44 AM   Progress in Treatment: Attending groups: Yes Participating in groups: Yes Taking medication as prescribed:  Yes Tolerating medication:  Yes Family/Significant othe contact made: Not as yet, CSW to call today Patient understands diagnosis: Yes Discussing patient identified problems/goals with staff: Yes Medical problems stabilized or resolved:  Yes Denies suicidal/homicidal ideation: Yes Patient has not harmed self or Others: Yes  New problem(s) identified: None Identified  Discharge Plan or Barriers:  CSW will set up followup with patient's current psychiatrist and therapist.  Also have asked counselor at [facility] IOP to come speak with patient and inform her of the program for consideration  Additional comments: N/A  Reason for Continuation of Hospitalization: Anxiety Depression Medication stabilization   Estimated length of stay: 1 days  For review of initial/current patient goals, please see plan of care.  Attendees: Patient:     Family:     Physician:   07/23/2012 9:44 AM   Nursing:   Roswell Miners, RN 07/23/2012 9:44 AM   Clinical Social Worker Ronda Fairly 07/23/2012 9:44 AM   Other:  Serena Colonel, PA 07/23/2012 9:44 AM   Other:  Cherlynn June Psych Student 07/23/2012 9:44 AM   Other:  Robbie Louis, RN 07/23/2012 9:44 AM   Other:   07/23/2012 9:44 AM    Scribe for Treatment Team:   Carney Bern, LCSWA  07/23/2012 9:44 AM

## 2012-07-23 NOTE — [facility] Suicide Risk Assessment (Signed)
[facility] INPATIENT:  Family/Significant Other Suicide Prevention Education  Suicide Prevention Education:  Education Completed; Wendee Copp. Mother, at [phone]  has been identified by the patient as the family member/significant other with whom the patient will be residing, and identified as the person(s) who will aid the patient in the event of a mental health crisis (suicidal ideations/suicide attempt).  With written consent from the patient, the family member/significant other has been provided the following suicide prevention education, prior to the and/or following the discharge of the patient.  The suicide prevention education provided includes the following:  Suicide risk factors  Suicide prevention and interventions  National Suicide Hotline telephone number  [facility] assessment telephone number  [facility] Emergency Assistance 911  [facility] and/or Residential Mobile Crisis Unit telephone number  Request made of family/significant other to:  Remove weapons (e.g., guns, rifles, knives), all items previously/currently identified as safety concern.  Patient's mother reports there are no firearms at patient's home and the one firearm at parental home is secured in locked safe.   Remove drugs/medications (over-the-counter, prescriptions, illicit drugs), all items previously/currently identified as a safety concern.  The family member/significant other verbalizes understanding of the suicide prevention education information provided.  The family member/significant other agrees to remove the items of safety concern listed above.  [name] 07/23/2012, 11:48 AM

## 2012-07-24 MED ORDER — CLONAZEPAM 1 MG PO TABS: 1.0000 mg | ORAL_TABLET | Freq: Four times a day (QID) | ORAL | Status: DC

## 2012-07-24 MED ORDER — GABAPENTIN 100 MG PO CAPS: 200.0000 mg | ORAL_CAPSULE | Freq: Three times a day (TID) | ORAL | Status: DC

## 2012-07-24 MED ORDER — RAMELTEON 8 MG PO TABS: 8.0000 mg | ORAL_TABLET | Freq: Every day | ORAL | Status: DC

## 2012-07-24 MED ORDER — FOLIC ACID 800 MCG PO TABS: 400.0000 ug | ORAL_TABLET | Freq: Every day | ORAL | Status: DC

## 2012-07-24 MED ORDER — TOPIRAMATE 100 MG PO TABS: 300.0000 mg | ORAL_TABLET | Freq: Every day | ORAL | Status: DC

## 2012-07-24 MED ORDER — DESVENLAFAXINE SUCCINATE ER 100 MG PO TB24: 100.0000 mg | ORAL_TABLET | Freq: Every day | ORAL | Status: DC

## 2012-07-24 MED ORDER — IBUPROFEN 200 MG PO TABS: 800.0000 mg | ORAL_TABLET | Freq: Three times a day (TID) | ORAL | Status: DC | PRN

## 2012-07-24 MED ORDER — ONDANSETRON HCL 4 MG PO TABS: 4.0000 mg | ORAL_TABLET | Freq: Every day | ORAL | Status: DC | PRN

## 2012-07-24 MED ORDER — PREDNISONE 5 MG PO TABS: 5.0000 mg | ORAL_TABLET | Freq: Every day | ORAL | Status: DC

## 2012-07-24 MED ORDER — QUETIAPINE FUMARATE 25 MG PO TABS: 75.0000 mg | ORAL_TABLET | Freq: Every day | ORAL | Status: DC

## 2012-07-24 NOTE — Progress Notes (Signed)
Adult Psychoeducational Group Note  Date:  07/24/2012 Time:  11:42 AM  Group Topic/Focus:  Overcoming Stress:   The focus of this group is to define stress and help patients assess their triggers.  Participation Level:  Active  Participation Quality:  Appropriate  Affect:  Appropriate  Cognitive:  Appropriate  Insight: Appropriate  Engagement in Group:  Engaged  Modes of Intervention:  Education and Support  Additional Comments:  Jennifer [name] was present in group and involved in the discussion.   Nichola Sizer 07/24/2012, 11:42 AM

## 2012-07-24 NOTE — Progress Notes (Signed)
[facility] Adult Case Management Discharge Plan :  Will you be returning to the same living situation after discharge: Yes,  parental home At discharge, do you have transportation home?:Yes,  family Do you have the ability to pay for your medications:Yes,  with incurance copay  Release of information consent forms completed and in the chart;  Patient's signature needed at discharge.  Patient to Follow up at: Follow-up Information   Follow up with Dr Evelene Croon On 08/14/2012. (Appointment with Dr Evelene Croon at 5:30 PM on June 12)    Contact information:   [address]  [place], [place] [address] FAX [phone] [facility] [phone]      Follow up with Tod Persia On 07/24/2012. (Appointment tonight with Tod Persia at 6 PM)    Contact information:   [facility]  [address] [place], [place] [address]  [facility] [phone] Valinda Hoar [phone]      Patient denies SI/HI:   Yes,  denies both    Safety Planning and Suicide Prevention discussed:  Yes,  with patient's mother  [name] 07/24/2012, 8:31 PM

## 2012-07-24 NOTE — [facility] Suicide Risk Assessment (Signed)
Suicide Risk Assessment  Discharge Assessment     Demographic Factors:  Caucasian  Mental Status Per Nursing Assessment::   On Admission:     Current Mental Status by Physician: In full contact with reality. There are no suicidal ideas, plans or intent. Her mood is "better' her affect is appropriate. Denies suicidal ideas, plans or intent. She is willing and motivated to pursue further outpatient treatment. She is going to stay with her parents what is a safer place for her. She is going back to work next tuesday   Loss Factors: NA  Historical Factors: NA  Risk Reduction Factors:   Responsible for children under 34 years of age, Sense of responsibility to family, Employed, Living with another person, especially a relative and Positive social support  Continued Clinical Symptoms:  Panic Attacks Depression:   Insomnia  Cognitive Features That Contribute To Risk: None identified   Suicide Risk:  Minimal: No identifiable suicidal ideation.  Patients presenting with no risk factors but with morbid ruminations; may be classified as minimal risk based on the severity of the depressive symptoms  Discharge Diagnoses:   AXIS I:  Major Depression recurrent, GAD, Panic attacks AXIS II:  Deferred AXIS III:   Past Medical History  Diagnosis Date  . Headache     migraines  . Orthostatic syncope   . Arthritis     inactive RA- no meds  . IBS (irritable bowel syndrome)   . Dysrhythmia     PVCs  . Depression   . Anxiety   . Hemochromatosis   . Rheumatoid arthritis 02/04/2012   AXIS IV:  other psychosocial or environmental problems and problems with primary support group AXIS V:  61-70 mild symptoms  Plan Of Care/Follow-up recommendations:  Activity:  as tolerated Diet:  regular Follow up Dr. Evelene Croon and Bennie Pierini Is patient on multiple antipsychotic therapies at discharge:  No   Has Patient had three or more failed trials of antipsychotic monotherapy by history:   No  Recommended Plan for Multiple Antipsychotic Therapies: N/A   Tanna Loeffler A 07/24/2012, 1:16 PM

## 2012-07-24 NOTE — Discharge Summary (Signed)
Physician Discharge Summary Note  Patient:  Jennifer [name] is an 47 y.o., female MRN:  [id number] DOB:  [birth year] Patient phone:  [phone] (home)  Patient address:   [address] [place] [address],   Date of Admission:  07/21/2012 Date of Discharge: 07/24/12  Reason for Admission:  Depression/anxiety  Discharge Diagnoses: Active Problems:   Major depression, recurrent   GAD (generalized anxiety disorder)   Panic attacks  Review of Systems  Constitutional: Negative.   HENT: Negative.   Eyes: Negative.   Respiratory: Negative.   Cardiovascular: Negative.   Gastrointestinal: Negative.   Genitourinary: Negative.   Musculoskeletal: Negative.   Skin: Negative.   Neurological: Negative.   Endo/Heme/Allergies: Negative.   Psychiatric/Behavioral: Positive for depression (Stabilized with medication prior to discharge). Negative for suicidal ideas, hallucinations, memory loss and substance abuse. The patient is nervous/anxious (Stabilized with medication prior to discharge). The patient does not have insomnia.    Axis Diagnosis:   AXIS I:  Generalized Anxiety Disorder, Panic Disorder and Major depression, recurrent AXIS II:  Deferred AXIS III:   Past Medical History  Diagnosis Date  . Headache     migraines  . Orthostatic syncope   . Arthritis     inactive RA- no meds  . IBS (irritable bowel syndrome)   . Dysrhythmia     PVCs  . Depression   . Anxiety   . Hemochromatosis   . Rheumatoid arthritis 02/04/2012   AXIS IV:  other psychosocial or environmental problems AXIS V:  64  Level of Care:  OP  Hospital Course:  This is a 47 year old Caucasian female. Admitted to [facility] as a walk-in admit with complaints of increased depression/anxiety, suicidal thoughts and an attempt to cut left wrist. She is accompanied by her mother. Patient reports, "I have been having some anxiety, depression and suicidal thoughts. I have had depression x many years. But my  anxiety has worsened in the last 3 weeks. It is a paralyzing anxiety. My anxiety worsened when my GI doctor took me off of Topamax. He thought that I have lost too much weight. I take Topamax for migraine headaches. I believe that my weight loss is coming from me having chronic diarrhea after receiving chemotherapy for my severe arthritis pain. Part of the reason I got very depressed is because, I'm a single mother trying to raise 3 boys by myself. Then I have this debilitating disease that does not seem to get better. I suffer bad case of migraine headaches. I have fibromyalgia and Rheumatoid arthritis. I'm in constant pain/discomfort. I had attempted suicide x 2 in the past and just last week, I attempted to cut my wrists. That is the reason I'm currently staying with my parents".   While a patient in this hospital and after admission assessment/evaluation, it was determined based on patient's symptoms that she will need medication management to stabilize her current depressive mood/anxiety symptoms. She was ordered and received Seroquel 75 mg Q bedtime for mood control, Pristiq 100 mg daily for depression Gabapentin 100 mg for anxiety symptoms, Rozerem 8 mg for sleep, Klonopin 1 mg for severe anxiety and Topamax 100 mg for mood stabilization . Clonazepam 1 mg was used instead of Xanax because of it's long acting effects. She also was enrolled in group counseling sessions and activities where she was counseled and learned coping skills that should help her cope better and manage her symptoms effectively after discharge. She also received medication management and monitoring  for her other previously existing medical issues and concerns. She tolerated her treatment regimen without any significant adverse effects and or reactions presented.   Patient did respond positively to her treatment regimen. This is evidenced by her daily reports of improved mood, reduction of symptoms and presentation of good affect/eye  contact. She attended treatment team meeting this am and met with her treatment team members. Her reason for admission, present symptoms, response to treatment and discharge plans discussed with patient. Ms. Faraone endorsed that her symptoms has stabilized and that she is ready for discharge to pursue psychiatric care on outpatient basis. It was then agreed upon that patient will follow-up care with Dr. Evelene Croon for routine psychiatric care and medication management on 08/14/12 at 05:30 pm. And for counseling services, patient will continue to see Reesa McKinnney on 08/24/12 at 06:00 pm. The address, dates, times and contact information for this clinic provided for patient in writing.  Upon discharge, Ms. Savin adamantly denies any suicidal, homicidal ideations, auditory, visual hallucinations, paranoia and or delusional thoughts. She was provided with 4 days worth supply samples of her [facility] discharge medications. She left [facility] with all personal belongings via personal arranged transport in no apparent distress.  Consults:  psychiatry  Significant Diagnostic Studies:  labs: CBC with diff, CMP, UDS, Toxicology tests, U/A  Discharge Vitals:   Blood pressure 93/65, pulse 108, temperature 97.5 F (36.4 C), temperature source Oral, resp. rate 16, height 5\' 7"  (1.702 m), weight 60.782 kg (134 lb). Body mass index is 20.98 kg/(m^2). Lab Results:   Results for orders placed during the hospital encounter of 07/21/12 (from the past 72 hour(s))  PREGNANCY, URINE     Status: None   Collection Time    07/22/12 11:27 AM      Result Value Range   Preg Test, Ur NEGATIVE  NEGATIVE   Comment:            THE SENSITIVITY OF THIS     METHODOLOGY IS >20 mIU/mL.  URINALYSIS, ROUTINE W REFLEX MICROSCOPIC     Status: None   Collection Time    07/22/12 11:27 AM      Result Value Range   Color, Urine YELLOW  YELLOW   APPearance CLEAR  CLEAR   Specific Gravity, Urine 1.011  1.005 - 1.030   pH 6.5  5.0 - 8.0   Glucose, UA  NEGATIVE  NEGATIVE mg/dL   Hgb urine dipstick NEGATIVE  NEGATIVE   Bilirubin Urine NEGATIVE  NEGATIVE   Ketones, ur NEGATIVE  NEGATIVE mg/dL   Protein, ur NEGATIVE  NEGATIVE mg/dL   Urobilinogen, UA 0.2  0.0 - 1.0 mg/dL   Nitrite NEGATIVE  NEGATIVE   Leukocytes, UA NEGATIVE  NEGATIVE   Comment: MICROSCOPIC NOT DONE ON URINES WITH NEGATIVE PROTEIN, BLOOD, LEUKOCYTES, NITRITE, OR GLUCOSE <1000 mg/dL.  URINE RAPID DRUG SCREEN (HOSP PERFORMED)     Status: Abnormal   Collection Time    07/22/12 11:27 AM      Result Value Range   Opiates NONE DETECTED  NONE DETECTED   Cocaine NONE DETECTED  NONE DETECTED   Benzodiazepines POSITIVE (*) NONE DETECTED   Amphetamines NONE DETECTED  NONE DETECTED   Tetrahydrocannabinol NONE DETECTED  NONE DETECTED   Barbiturates NONE DETECTED  NONE DETECTED   Comment:            DRUG SCREEN FOR MEDICAL PURPOSES     ONLY.  IF CONFIRMATION IS NEEDED     FOR ANY PURPOSE, NOTIFY  LAB     WITHIN 5 DAYS.                LOWEST DETECTABLE LIMITS     FOR URINE DRUG SCREEN     Drug Class       Cutoff (ng/mL)     Amphetamine      1000     Barbiturate      200     Benzodiazepine   200     Tricyclics       300     Opiates          300     Cocaine          300     THC              50  T3, FREE     Status: None   Collection Time    07/22/12  8:11 PM      Result Value Range   T3, Free 2.6  2.3 - 4.2 pg/mL  T4, FREE     Status: None   Collection Time    07/22/12  8:11 PM      Result Value Range   Free T4 0.90  0.80 - 1.80 ng/dL  CBC WITH DIFFERENTIAL     Status: Abnormal   Collection Time    07/23/12  6:25 AM      Result Value Range   WBC 6.2  4.0 - 10.5 K/uL   RBC 3.76 (*) 3.87 - 5.11 MIL/uL   Hemoglobin 12.5  12.0 - 15.0 g/dL   HCT 13.0  86.5 - 78.4 %   MCV 100.3 (*) 78.0 - 100.0 fL   MCH 33.2  26.0 - 34.0 pg   MCHC 33.2  30.0 - 36.0 g/dL   RDW 69.6  29.5 - 28.4 %   Platelets 191  150 - 400 K/uL   Neutrophils Relative % 34 (*) 43 - 77 %   Neutro Abs  2.1  1.7 - 7.7 K/uL   Lymphocytes Relative 55 (*) 12 - 46 %   Lymphs Abs 3.4  0.7 - 4.0 K/uL   Monocytes Relative 9  3 - 12 %   Monocytes Absolute 0.6  0.1 - 1.0 K/uL   Eosinophils Relative 2  0 - 5 %   Eosinophils Absolute 0.1  0.0 - 0.7 K/uL   Basophils Relative 1  0 - 1 %   Basophils Absolute 0.0  0.0 - 0.1 K/uL  COMPREHENSIVE METABOLIC PANEL     Status: Abnormal   Collection Time    07/23/12  6:25 AM      Result Value Range   Sodium 139  135 - 145 mEq/L   Potassium 3.6  3.5 - 5.1 mEq/L   Chloride 106  96 - 112 mEq/L   CO2 26  19 - 32 mEq/L   Glucose, Bld 91  70 - 99 mg/dL   BUN 9  6 - 23 mg/dL   Creatinine, Ser 1.32  0.50 - 1.10 mg/dL   Calcium 9.1  8.4 - 44.0 mg/dL   Total Protein 6.7  6.0 - 8.3 g/dL   Albumin 3.7  3.5 - 5.2 g/dL   AST 20  0 - 37 U/L   ALT 23  0 - 35 U/L   Alkaline Phosphatase 52  39 - 117 U/L   Total Bilirubin 0.5  0.3 - 1.2 mg/dL   GFR calc non Af Amer 82 (*) >90 mL/min  GFR calc Af Amer >90  >90 mL/min   Comment:            The eGFR has been calculated     using the CKD EPI equation.     This calculation has not been     validated in all clinical     situations.     eGFR's persistently     <90 mL/min signify     possible Chronic Kidney Disease.  TSH     Status: None   Collection Time    07/23/12  6:25 AM      Result Value Range   TSH 1.676  0.350 - 4.500 uIU/mL    Physical Findings: AIMS: Facial and Oral Movements Muscles of Facial Expression: None, normal Lips and Perioral Area: None, normal Jaw: None, normal Tongue: None, normal,Extremity Movements Upper (arms, wrists, hands, fingers): None, normal Lower (legs, knees, ankles, toes): None, normal, Trunk Movements Neck, shoulders, hips: None, normal, Overall Severity Severity of abnormal movements (highest score from questions above): None, normal Incapacitation due to abnormal movements: None, normal Patient's awareness of abnormal movements (rate only patient's report): No Awareness,  Dental Status Current problems with teeth and/or dentures?: No Does patient usually wear dentures?: No  CIWA:    COWS:     Psychiatric Specialty Exam: See Psychiatric Specialty Exam and Suicide Risk Assessment completed by Attending Physician prior to discharge.  Discharge destination:  Home  Is patient on multiple antipsychotic therapies at discharge:  No   Has Patient had three or more failed trials of antipsychotic monotherapy by history:  No  Recommended Plan for Multiple Antipsychotic Therapies: NA       Future Appointments Provider Department Dept Phone   01/19/2013 12:00 PM Windell Hummingbird [facility] [phone]   01/26/2013 12:30 PM Levert Feinstein, MD [facility] [phone]       Medication List    STOP taking these medications       acetaminophen 325 MG tablet  Commonly known as:  TYLENOL     ALPRAZolam 1 MG tablet  Commonly known as:  XANAX     etanercept 50 MG/ML injection  Commonly known as:  ENBREL     tiZANidine 4 MG tablet  Commonly known as:  ZANAFLEX      TAKE these medications     Indication   clonazePAM 1 MG tablet  Commonly known as:  KLONOPIN  Take 1 tablet (1 mg total) by mouth 4 (four) times daily. For anxiety   Indication:  Panic Disorder     desvenlafaxine 100 MG 24 hr tablet  Commonly known as:  PRISTIQ  Take 1 tablet (100 mg total) by mouth daily. For depression   Indication:  Major Depressive Disorder     folic acid 800 MCG tablet  Commonly known as:  FOLVITE  Take 0.5 tablets (400 mcg total) by mouth daily. For low folate   Indication:  Deficiency of Folic Acid in the Diet     gabapentin 100 MG capsule  Commonly known as:  NEURONTIN  Take 2 capsules (200 mg total) by mouth 3 (three) times daily. For anxiety   Indication:  Agitation, Anxiety     ibuprofen 200 MG tablet  Commonly known as:  ADVIL,MOTRIN  Take 4 tablets (800 mg total) by mouth every 8  (eight) hours as needed (for migraine).   Indication:  Migraine Headache, Mild to Moderate Pain     ondansetron 4  MG tablet  Commonly known as:  ZOFRAN  Take 1 tablet (4 mg total) by mouth daily as needed for nausea.   Indication:  Nausea     predniSONE 5 MG tablet  Commonly known as:  DELTASONE  Take 1 tablet (5 mg total) by mouth daily. For inflammation   Indication:  Inflammation     QUEtiapine 25 MG tablet  Commonly known as:  SEROQUEL  Take 3 tablets (75 mg total) by mouth at bedtime.   Indication:  Trouble Sleeping, mood control, anxiety     ramelteon 8 MG tablet  Commonly known as:  ROZEREM  Take 1 tablet (8 mg total) by mouth at bedtime. For sleep   Indication:  Trouble Sleeping     topiramate 100 MG tablet  Commonly known as:  TOPAMAX  Take 3 tablets (300 mg total) by mouth daily. For mood stabilization   Indication:  Migraine Headache, Mood stabilization       Follow-up Information   Follow up with Dr Evelene Croon On 08/14/2012. (Appointment with Dr Evelene Croon at 5:30 PM on June 12)    Contact information:   [address]  [place], [place] [address] FAX [phone] [facility] [phone]      Follow up with Tod Persia On 07/24/2012. (Appointment tonight with Tod Persia at 6 PM)    Contact information:   [facility]  [address] [place], [place] [address]  [facility] [phone]     Follow-up recommendations:  Activity:  As tolerated Diet: As recommended by your primary care doctor. Keep all scheduled follow-up appointments as recommended. Will continue to work on life style changes as well as psychotherapy and medications to help bring symptoms to remmission Comments: Take all your medications as prescribed by your mental healthcare provider. Report any adverse effects and or reactions from your medicines to your outpatient provider promptly. Patient is instructed and cautioned to not engage in alcohol and or illegal drug use while on  prescription medicines. In the event of worsening symptoms, patient is instructed to call the crisis hotline, 911 and or go to the nearest ED for appropriate evaluation and treatment of symptoms. Follow-up with your primary care provider for your other medical issues, concerns and or health care needs.    Total Discharge Time:  Greater than 30 minutes.  SignedArmandina Stammer I 07/24/2012, 3:27 PM
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
## 2012-07-24 NOTE — Progress Notes (Signed)
Patient ID: Jennifer [name], female   DOB: Dec 31, 1965, 47 y.o.   MRN: [id number] She has been discharged home and was picked up by her father. She voiced understanding of discharge instruction and of follow up plan , She denies thought of harming self or others, all belongings taken home with her.

## 2012-07-24 NOTE — [facility] Group Notes (Signed)
[facility] LCSW Aftercare Discharge Planning Group Note   07/24/2012 8:45 AM  Participation Quality:  Appropriate  Mood/Affect:  Appropriate  Depression Rating:  4  Anxiety Rating:  4  Thoughts of Suicide:  No Will you contract for safety?   NA  Current AVH:  No  Plan for Discharge/Comments:  Return Home with parents and children, return to work and follow up with Dr Evelene Croon and Tod Persia  Transportation Means:  Family  Supports: Family, therapist and doctor and good friends  Dyane Dustman, Julious Payer

## 2012-07-29 ENCOUNTER — Other Ambulatory Visit: Payer: Self-pay | Admitting: *Deleted

## 2012-07-29 ENCOUNTER — Telehealth: Payer: Self-pay | Admitting: *Deleted

## 2012-07-29 DIAGNOSIS — Z862 Personal history of diseases of the blood and blood-forming organs and certain disorders involving the immune mechanism: Secondary | ICD-10-CM

## 2012-07-29 NOTE — Telephone Encounter (Signed)
sw pt gave an appt for lab on 08/05/12 2 11:30AM. Pt is aware...td

## 2012-07-29 NOTE — Progress Notes (Signed)
Patient Discharge Instructions:  After Visit Summary (AVS):   Faxed to:  07/29/12 Discharge Summary Note:   Faxed to:  07/29/12 Psychiatric Admission Assessment Note:   Faxed to:  07/29/12 Suicide Risk Assessment - Discharge Assessment:   Faxed to:  07/29/12 Faxed/Sent to the Next Level Care provider:  07/29/12  Faxed to Dr. Evelene Croon @ [phone] Records sent via mail to: [facility]  [address] [place], [place] [address]  Jennifer [name], 07/29/2012, 1:37 PM

## 2012-08-05 ENCOUNTER — Other Ambulatory Visit: Payer: Self-pay | Admitting: Lab

## 2012-08-05 ENCOUNTER — Encounter: Payer: Self-pay | Admitting: *Deleted

## 2012-08-05 ENCOUNTER — Other Ambulatory Visit: Payer: Self-pay | Admitting: *Deleted

## 2012-08-05 ENCOUNTER — Other Ambulatory Visit (HOSPITAL_BASED_OUTPATIENT_CLINIC_OR_DEPARTMENT_OTHER): Payer: 59 | Admitting: Lab

## 2012-08-05 DIAGNOSIS — Z862 Personal history of diseases of the blood and blood-forming organs and certain disorders involving the immune mechanism: Secondary | ICD-10-CM

## 2012-08-06 ENCOUNTER — Telehealth: Payer: Self-pay | Admitting: *Deleted

## 2012-08-06 NOTE — Telephone Encounter (Signed)
sw pt gv a lab appt for 11/10/12 @12  noon.per her request...td

## 2012-08-06 NOTE — Telephone Encounter (Signed)
Called pt & informed that labs done yest cancelled b/c not needed until Sept -4 mo from last lab per Dr Cyndie Chime.  Informed also that phebotomy not needed right now based on last labs per Dr Cyndie Chime.

## 2012-10-17 ENCOUNTER — Observation Stay (HOSPITAL_COMMUNITY): Payer: 59

## 2012-10-17 ENCOUNTER — Encounter (HOSPITAL_COMMUNITY): Payer: Self-pay | Admitting: *Deleted

## 2012-10-17 ENCOUNTER — Observation Stay (HOSPITAL_COMMUNITY)
Admission: EM | Admit: 2012-10-17 | Discharge: 2012-10-20 | Disposition: A | Discharge status: Home / Self Care | Payer: 59 | Attending: Internal Medicine | Admitting: Internal Medicine

## 2012-10-17 DIAGNOSIS — R1032 Left lower quadrant pain: Secondary | ICD-10-CM

## 2012-10-17 DIAGNOSIS — R55 Syncope and collapse: Principal | ICD-10-CM | POA: Insufficient documentation

## 2012-10-17 DIAGNOSIS — I951 Orthostatic hypotension: Secondary | ICD-10-CM

## 2012-10-17 DIAGNOSIS — M797 Fibromyalgia: Secondary | ICD-10-CM

## 2012-10-17 DIAGNOSIS — K589 Irritable bowel syndrome without diarrhea: Secondary | ICD-10-CM

## 2012-10-17 DIAGNOSIS — G2581 Restless legs syndrome: Secondary | ICD-10-CM

## 2012-10-17 DIAGNOSIS — F410 Panic disorder [episodic paroxysmal anxiety] without agoraphobia: Secondary | ICD-10-CM

## 2012-10-17 DIAGNOSIS — Q078 Other specified congenital malformations of nervous system: Secondary | ICD-10-CM

## 2012-10-17 DIAGNOSIS — F411 Generalized anxiety disorder: Secondary | ICD-10-CM

## 2012-10-17 DIAGNOSIS — F329 Major depressive disorder, single episode, unspecified: Secondary | ICD-10-CM | POA: Insufficient documentation

## 2012-10-17 DIAGNOSIS — R51 Headache: Secondary | ICD-10-CM | POA: Insufficient documentation

## 2012-10-17 DIAGNOSIS — M069 Rheumatoid arthritis, unspecified: Secondary | ICD-10-CM

## 2012-10-17 DIAGNOSIS — Z87898 Personal history of other specified conditions: Secondary | ICD-10-CM

## 2012-10-17 DIAGNOSIS — F3289 Other specified depressive episodes: Secondary | ICD-10-CM | POA: Insufficient documentation

## 2012-10-17 DIAGNOSIS — G4700 Insomnia, unspecified: Secondary | ICD-10-CM

## 2012-10-17 DIAGNOSIS — R110 Nausea: Secondary | ICD-10-CM

## 2012-10-17 DIAGNOSIS — F339 Major depressive disorder, recurrent, unspecified: Secondary | ICD-10-CM

## 2012-10-17 DIAGNOSIS — Z862 Personal history of diseases of the blood and blood-forming organs and certain disorders involving the immune mechanism: Secondary | ICD-10-CM

## 2012-10-17 LAB — URINALYSIS, ROUTINE W REFLEX MICROSCOPIC
Glucose, UA: NEGATIVE mg/dL
Hgb urine dipstick: NEGATIVE
Leukocytes, UA: NEGATIVE
Specific Gravity, Urine: 1.005 (ref 1.005–1.030)
Urobilinogen, UA: 0.2 mg/dL (ref 0.0–1.0)

## 2012-10-17 LAB — CBC WITH DIFFERENTIAL/PLATELET
Basophils Relative: 0 % (ref 0–1)
Hemoglobin: 12.5 g/dL (ref 12.0–15.0)
Lymphs Abs: 2.7 10*3/uL (ref 0.7–4.0)
Monocytes Relative: 8 % (ref 3–12)
Neutro Abs: 2.4 10*3/uL (ref 1.7–7.7)
Neutrophils Relative %: 42 % — ABNORMAL LOW (ref 43–77)
RBC: 3.62 MIL/uL — ABNORMAL LOW (ref 3.87–5.11)

## 2012-10-17 LAB — POCT PREGNANCY, URINE: Preg Test, Ur: NEGATIVE

## 2012-10-17 LAB — BASIC METABOLIC PANEL
GFR calc non Af Amer: >90 mL/min (ref >90)
Glucose, Bld: 92 mg/dL (ref 70–99)
Potassium: 3.8 mEq/L (ref 3.5–5.1)
Sodium: 141 mEq/L (ref 135–145)

## 2012-10-17 MED ORDER — OXYCODONE HCL 5 MG PO TABS
5.0000 mg | ORAL_TABLET | ORAL | Status: AC
  Administered 2012-10-17: 5 mg via ORAL
  Filled 2012-10-17: qty 1

## 2012-10-17 MED ORDER — SODIUM CHLORIDE 0.9 % IV BOLUS (SEPSIS)
1000.0000 mL | Freq: Once | INTRAVENOUS | Status: AC
  Administered 2012-10-17: 1000 mL via INTRAVENOUS

## 2012-10-17 MED ORDER — HYDROCORTISONE SOD SUCCINATE 100 MG IJ SOLR
50.0000 mg | Freq: Once | INTRAMUSCULAR | Status: AC
  Administered 2012-10-18: 50 mg via INTRAVENOUS
  Filled 2012-10-17: qty 1

## 2012-10-17 MED ORDER — POLYVINYL ALCOHOL 1.4 % OP SOLN
2.0000 [drp] | OPHTHALMIC | Status: DC | PRN
  Filled 2012-10-17: qty 15

## 2012-10-17 MED ORDER — ACETAMINOPHEN 650 MG RE SUPP: 650.0000 mg | Freq: Four times a day (QID) | RECTAL | Status: DC | PRN

## 2012-10-17 MED ORDER — SODIUM CHLORIDE 0.9 % IV SOLN
INTRAVENOUS | Status: DC
  Administered 2012-10-18: 01:00:00 via INTRAVENOUS

## 2012-10-17 MED ORDER — ACETAMINOPHEN 325 MG PO TABS
650.0000 mg | ORAL_TABLET | Freq: Four times a day (QID) | ORAL | Status: DC | PRN
  Administered 2012-10-18 – 2012-10-19 (×3): 650 mg via ORAL
  Filled 2012-10-17 (×3): qty 2

## 2012-10-17 MED ORDER — ONDANSETRON HCL 4 MG PO TABS: 4.0000 mg | ORAL_TABLET | Freq: Every day | ORAL | Status: DC | PRN

## 2012-10-17 MED ORDER — ETANERCEPT 50 MG/ML ~~LOC~~ SOLN: 50.0000 mg | SUBCUTANEOUS | Status: DC

## 2012-10-17 MED ORDER — TIZANIDINE HCL 4 MG PO CAPS
4.0000 mg | ORAL_CAPSULE | Freq: Three times a day (TID) | ORAL | Status: DC | PRN
  Administered 2012-10-18: 4 mg via ORAL
  Filled 2012-10-17 (×2): qty 1

## 2012-10-17 MED ORDER — CLONAZEPAM 1 MG PO TABS
1.0000 mg | ORAL_TABLET | Freq: Four times a day (QID) | ORAL | Status: DC
  Administered 2012-10-18 (×3): 1 mg via ORAL
  Filled 2012-10-17 (×3): qty 1

## 2012-10-17 MED ORDER — DESVENLAFAXINE SUCCINATE ER 100 MG PO TB24
100.0000 mg | ORAL_TABLET | Freq: Every day | ORAL | Status: DC
  Administered 2012-10-18 – 2012-10-20 (×3): 100 mg via ORAL
  Filled 2012-10-17 (×3): qty 1

## 2012-10-17 MED ORDER — ONDANSETRON HCL 4 MG/2ML IJ SOLN: 4.0000 mg | Freq: Four times a day (QID) | INTRAMUSCULAR | Status: DC | PRN

## 2012-10-17 MED ORDER — ACETAMINOPHEN 325 MG PO TABS
650.0000 mg | ORAL_TABLET | ORAL | Status: AC
  Administered 2012-10-17: 650 mg via ORAL
  Filled 2012-10-17: qty 2

## 2012-10-17 MED ORDER — RAMELTEON 8 MG PO TABS
8.0000 mg | ORAL_TABLET | Freq: Every evening | ORAL | Status: DC | PRN
  Filled 2012-10-17: qty 1

## 2012-10-17 MED ORDER — ONDANSETRON HCL 4 MG PO TABS: 4.0000 mg | ORAL_TABLET | Freq: Four times a day (QID) | ORAL | Status: DC | PRN

## 2012-10-17 MED ORDER — GABAPENTIN 300 MG PO CAPS
300.0000 mg | ORAL_CAPSULE | Freq: Three times a day (TID) | ORAL | Status: DC
  Administered 2012-10-18 (×2): 300 mg via ORAL
  Filled 2012-10-17 (×5): qty 1

## 2012-10-17 MED ORDER — PREDNISONE 5 MG PO TABS
5.0000 mg | ORAL_TABLET | Freq: Every day | ORAL | Status: DC
  Administered 2012-10-18 – 2012-10-20 (×3): 5 mg via ORAL
  Filled 2012-10-17 (×3): qty 1

## 2012-10-17 MED ORDER — TEARS RENEWED OP SOLN: 2.0000 [drp] | OPHTHALMIC | Status: DC | PRN

## 2012-10-17 MED ORDER — TOPIRAMATE 100 MG PO TABS
300.0000 mg | ORAL_TABLET | Freq: Every day | ORAL | Status: DC
  Administered 2012-10-18 – 2012-10-20 (×3): 300 mg via ORAL
  Filled 2012-10-17 (×3): qty 3

## 2012-10-17 MED ORDER — SODIUM CHLORIDE 0.9 % IJ SOLN
3.0000 mL | Freq: Two times a day (BID) | INTRAMUSCULAR | Status: DC
  Administered 2012-10-18 – 2012-10-19 (×4): 3 mL via INTRAVENOUS

## 2012-10-17 NOTE — ED Notes (Signed)
Pt from home. Has had syncopal episodes increasing in frequency for past month to where she has been passing out twice a day. Pt denies cardiac history, though does report having similar episodes a few years ago. EMS inserted PIV and administered 4mg  Zofran for complaints of nausea and vomiting after syncopal episode. Has had an MRI preformed to rule out seizures and has an appt this Tuesday with a cardiologist. Was told if she had another syncopal episode to call EMS and come to the ER.

## 2012-10-18 ENCOUNTER — Encounter (HOSPITAL_COMMUNITY): Payer: Self-pay

## 2012-10-18 DIAGNOSIS — IMO0001 Reserved for inherently not codable concepts without codable children: Secondary | ICD-10-CM

## 2012-10-18 DIAGNOSIS — F410 Panic disorder [episodic paroxysmal anxiety] without agoraphobia: Secondary | ICD-10-CM

## 2012-10-18 DIAGNOSIS — F339 Major depressive disorder, recurrent, unspecified: Secondary | ICD-10-CM

## 2012-10-18 DIAGNOSIS — F411 Generalized anxiety disorder: Secondary | ICD-10-CM

## 2012-10-18 DIAGNOSIS — G4700 Insomnia, unspecified: Secondary | ICD-10-CM

## 2012-10-18 DIAGNOSIS — G2581 Restless legs syndrome: Secondary | ICD-10-CM

## 2012-10-18 LAB — HIV ANTIBODY (ROUTINE TESTING W REFLEX): HIV: NONREACTIVE

## 2012-10-18 LAB — CBC
MCHC: 35.3 g/dL (ref 30.0–36.0)
MCV: 96.3 fL (ref 78.0–100.0)
Platelets: 152 10*3/uL (ref 150–400)
RDW: 11.6 % (ref 11.5–15.5)
WBC: 8.9 10*3/uL (ref 4.0–10.5)

## 2012-10-18 LAB — BASIC METABOLIC PANEL
BUN: 5 mg/dL — ABNORMAL LOW (ref 6–23)
Calcium: 8.2 mg/dL — ABNORMAL LOW (ref 8.4–10.5)
Creatinine, Ser: 0.75 mg/dL (ref 0.50–1.10)
GFR calc Af Amer: >90 mL/min (ref >90)

## 2012-10-18 LAB — CLOSTRIDIUM DIFFICILE BY PCR: Toxigenic C. Difficile by PCR: NEGATIVE

## 2012-10-18 MED ORDER — CLONAZEPAM 1 MG PO TABS: 1.0000 mg | ORAL_TABLET | Freq: Every day | ORAL | Status: DC

## 2012-10-18 MED ORDER — TIZANIDINE HCL 4 MG PO TABS
8.0000 mg | ORAL_TABLET | Freq: Every day | ORAL | Status: DC
  Administered 2012-10-18 – 2012-10-19 (×2): 8 mg via ORAL
  Filled 2012-10-18 (×3): qty 2

## 2012-10-18 MED ORDER — METOCLOPRAMIDE HCL 5 MG/ML IJ SOLN
10.0000 mg | Freq: Once | INTRAMUSCULAR | Status: AC
  Administered 2012-10-18: 10 mg via INTRAVENOUS
  Filled 2012-10-18: qty 2

## 2012-10-18 MED ORDER — ONDANSETRON HCL 4 MG/2ML IJ SOLN
4.0000 mg | Freq: Four times a day (QID) | INTRAMUSCULAR | Status: DC | PRN
Start: 2012-10-18 — End: 2012-10-20
  Administered 2012-10-18 – 2012-10-20 (×5): 4 mg via INTRAVENOUS
  Filled 2012-10-18 (×5): qty 2

## 2012-10-18 MED ORDER — BOOST / RESOURCE BREEZE PO LIQD: 1.0000 | Freq: Every day | ORAL | Status: DC | PRN

## 2012-10-18 MED ORDER — TIZANIDINE HCL 4 MG PO TABS
4.0000 mg | ORAL_TABLET | Freq: Three times a day (TID) | ORAL | Status: DC | PRN
  Filled 2012-10-18: qty 1

## 2012-10-18 MED ORDER — TIZANIDINE HCL 4 MG PO TABS
4.0000 mg | ORAL_TABLET | Freq: Four times a day (QID) | ORAL | Status: DC | PRN
  Filled 2012-10-18: qty 1

## 2012-10-18 MED ORDER — MIDODRINE HCL 2.5 MG PO TABS: 2.5000 mg | ORAL_TABLET | Freq: Three times a day (TID) | ORAL | Status: DC

## 2012-10-18 MED ORDER — KETOROLAC TROMETHAMINE 30 MG/ML IJ SOLN
30.0000 mg | Freq: Three times a day (TID) | INTRAMUSCULAR | Status: DC | PRN
  Administered 2012-10-18 – 2012-10-20 (×5): 30 mg via INTRAVENOUS
  Filled 2012-10-18 (×5): qty 1

## 2012-10-18 MED ORDER — DIPHENHYDRAMINE HCL 25 MG PO CAPS
25.0000 mg | ORAL_CAPSULE | Freq: Four times a day (QID) | ORAL | Status: DC | PRN
  Administered 2012-10-18 – 2012-10-20 (×5): 25 mg via ORAL
  Filled 2012-10-18 (×6): qty 1

## 2012-10-18 MED ORDER — DIPHENHYDRAMINE HCL 50 MG/ML IJ SOLN
25.0000 mg | Freq: Once | INTRAMUSCULAR | Status: AC
  Administered 2012-10-18: 25 mg via INTRAVENOUS
  Filled 2012-10-18: qty 1

## 2012-10-18 MED ORDER — KETOROLAC TROMETHAMINE 30 MG/ML IJ SOLN
30.0000 mg | Freq: Once | INTRAMUSCULAR | Status: AC
  Administered 2012-10-18: 30 mg via INTRAVENOUS
  Filled 2012-10-18: qty 1

## 2012-10-18 MED ORDER — ONDANSETRON HCL 4 MG PO TABS
4.0000 mg | ORAL_TABLET | Freq: Four times a day (QID) | ORAL | Status: DC | PRN
  Filled 2012-10-18: qty 1

## 2012-10-18 NOTE — Progress Notes (Signed)
INITIAL NUTRITION ASSESSMENT  DOCUMENTATION CODES Per approved criteria  -Not Applicable   INTERVENTION:  Resource Breeze daily PRN (250 kcals, 9 gm protein per 8 fl oz carton) RD to follow for nutrition care plan  NUTRITION DIAGNOSIS: Inadequate oral intake related to decreased level of consiousness as evidenced by PO intake 0%  Goal: Pt to meet >/= 90% of their estimated nutrition needs   Monitor:  PO & supplemental intake, weight, labs, I/O's  Reason for Assessment: Malnutrition Screening Tool Report  47 y.o. female  Admitting Dx: Orthostatic syncope  ASSESSMENT: Patient with PMH of hemachromatosis, IBS and chronic diarrhea; presented with complaint of loss of consciousness and recurrent syncopal episodes ---> admitted for further observation and management.   RD unable to wake patient to obtain nutrition hx; no % PO intake records available per flowsheet records; noted workup pending for C. difficile & HIV; would likely benefit from addition of nutrition supplements ---> RD to order on as needed basis.  Height: Ht Readings from Last 1 Encounters:  07/21/12 5\' 7"  (1.702 m)    Weight: Wt Readings from Last 1 Encounters:  07/21/12 134 lb (60.782 kg)    Ideal Body Weight: 135 lb  % Ideal Body Weight: 99%  Wt Readings from Last 10 Encounters:  07/21/12 134 lb (60.782 kg)  02/04/12 135 lb 12.8 oz (61.598 kg)  10/19/10 141 lb (63.957 kg)  07/27/09 171 lb (77.565 kg)  04/07/09 176 lb (79.833 kg)  09/25/07 156 lb 2.1 oz (70.821 kg)    Usual Body Weight: 135 lb  % Usual Body Weight: 99%  BMI:  20.7 kg/m2  Estimated Nutritional Needs: Kcal: 1500-1700 Protein: 70-80 gm Fluid: 1.5-1.7 L  Skin: Intact  Diet Order: General  EDUCATION NEEDS: -No education needs identified at this time   Intake/Output Summary (Last 24 hours) at 10/18/12 1335 Last data filed at 10/18/12 0600  Gross per 24 hour  Intake      0 ml  Output    650 ml  Net   -650 ml     Labs:   Recent Labs Lab 10/17/12 1855 10/18/12 0545  NA 141 139  K 3.8 3.7  CL 112 111  CO2 22 21  BUN 5* 5*  CREATININE 0.76 0.75  CALCIUM 7.8* 8.2*  GLUCOSE 92 117*    Scheduled Meds: . clonazePAM  1 mg Oral QID  . desvenlafaxine  100 mg Oral Daily  . [START ON 10/20/2012] etanercept  50 mg Subcutaneous Weekly  . gabapentin  300 mg Oral TID  . predniSONE  5 mg Oral Daily  . sodium chloride  3 mL Intravenous Q12H  . topiramate  300 mg Oral Daily    Continuous Infusions: . sodium chloride 75 mL/hr at 10/18/12 0032    Past Medical History  Diagnosis Date  . Headache(784.0)     migraines  . Orthostatic syncope   . Arthritis     inactive RA- no meds  . IBS (irritable bowel syndrome)   . Dysrhythmia     PVCs  . Depression   . Anxiety   . Hemochromatosis   . Rheumatoid arthritis(714.0) 02/04/2012    Past Surgical History  Procedure Laterality Date  . Patent foramen ovale closure  2008  . Cholecystectomy  1989  . Dilation and curettage of uterus  2004  . Laparoscopy    . Laparoscopic tubal ligation  10/26/2010    Procedure: LAPAROSCOPIC TUBAL LIGATION;  Surgeon: Zelphia Cairo;  Location: [facility];  Service: Gynecology;  Laterality: Bilateral;    Maureen Chatters, RD, LDN Pager #: [phone] After-Hours Pager #: [phone]

## 2012-10-18 NOTE — Evaluation (Signed)
Physical Therapy Evaluation Patient Details Name: Jennifer [name] MRN: [id number] DOB: 01/07/1966 Today's Date: 10/18/2012 Time: 4098-1191 PT Time Calculation (min): 17 min  PT Assessment / Plan / Recommendation History of Present Illness  47 y.o. female admitted to [facility] on 10/17/12 due to increased frequency of syncopal episodes.  Pt was admitted for observation and hydrated.    Clinical Impression  The pt is mobilizing well considering how sore and bruised she is from passing out at home.  Mild reports of lightheadedness upon standing initially, but able to walk without syncope.  Pt reports she stays very hydrated at home normally and that she hopes that the adjustment in some of her medication will help.  Her falls are unrelated to her physical mobility and she is close to baseline functioning.  She has no current PT needs.  Pt did ask if she could walk to the bathroom by her self and I advised her not to due to her syncopal episodes we don't want her to fall and hurt herself.    PT Assessment  Patent does not need any further PT services    Follow Up Recommendations  No PT follow up    Does the patient have the potential to tolerate intense rehabilitation     NA  Barriers to Discharge   none      Equipment Recommendations  None recommended by PT    Recommendations for Other Services    None  Frequency   NA- one time eval   Precautions / Restrictions Precautions Precautions: Fall Precaution Comments: pt reports syncopal episodes 2 times per day now   Pertinent Vitals/Pain See vitals flow sheet.       Mobility  Bed Mobility Bed Mobility: Supine to Sit;Sitting - Scoot to Edge of Bed;Sit to Supine Supine to Sit: 7: Independent Sitting - Scoot to Edge of Bed: 7: Independent Sit to Supine: 7: Independent Transfers Transfers: Stand to Sit;Sit to Stand Sit to Stand: 5: Supervision;With upper extremity assist Stand to Sit: 5: Supervision;With upper extremity assist Details for  Transfer Assistance: supervision for safety due to h/o syncopal episodes Ambulation/Gait Ambulation/Gait Assistance: 5: Supervision Ambulation Distance (Feet): 150 Feet Assistive device: None Ambulation/Gait Assistance Details: mildly antalgic gait pattern that is likely slightly slower than her usual due to bruising from fall.   Gait Pattern: Step-through pattern;Antalgic General Gait Details: steady, but mildly slower        PT Goals(Current goals can be found in the care plan section) Acute Rehab PT Goals Patient Stated Goal: to figure out why she is passing out more so it won't keep happening.   PT Goal Formulation: No goals set, d/c therapy  Visit Information  Last PT Received On: 10/18/12 Assistance Needed: +1 History of Present Illness: 47 y.o. female admitted to [facility] on 10/17/12 due to increased frequency of syncopal episodes.  Pt was admitted for observation and hydrated.         Prior Functioning  Home Living Family/patient expects to be discharged to:: Private residence Living Arrangements: Parent;Children (single mom lives with 3 boys 4 yrs-17 yrs old) Available Help at Discharge: Family;Available PRN/intermittently Prior Function Level of Independence: Independent Comments: Pt works full time for a [employer] Communication: No difficulties    Copywriter, advertising Arousal/Alertness: Awake/alert Behavior During Therapy: WFL for tasks assessed/performed Overall Cognitive Status: Within Functional Limits for tasks assessed    Extremity/Trunk Assessment Upper Extremity Assessment Upper Extremity Assessment: Overall WFL for tasks assessed Lower Extremity Assessment Lower  Extremity Assessment: Overall WFL for tasks assessed Cervical / Trunk Assessment Cervical / Trunk Assessment: Normal   Balance Balance Balance Assessed: Yes Static Sitting Balance Static Sitting - Balance Support: No upper extremity supported;Feet supported Static Sitting -  Level of Assistance: 7: Independent Dynamic Standing Balance Dynamic Standing - Balance Support: No upper extremity supported Dynamic Standing - Level of Assistance: 5: Stand by assistance  End of Session PT - End of Session Activity Tolerance: Patient tolerated treatment well Patient left: in bed;with call bell/phone within reach Nurse Communication: Mobility status;Patient requests pain meds   Rory Xiang B. Kenzy Campoverde, PT, DPT [phone]   10/18/2012, 4:35 PM

## 2012-10-18 NOTE — ED Provider Notes (Signed)
CSN: [id number]     Arrival date & time 10/17/12  1553 History     First MD Initiated Contact with Patient 10/17/12 1556     Chief Complaint  Patient presents with  . Near Syncope   (Consider location/radiation/quality/duration/timing/severity/associated sxs/prior Treatment) HPI Is a 47 female with history of orthostatic hypotension and respiratory arthritis who presents with worsening syncope. The patient reports that on average she might pass out every couple months secondary to orthostasis. She states over the last month she has had recurrent episodes of syncope and is currently having as many as 2-3 syncopal episodes per day. Today she fell and hit the right side of her face on the floor. Patient denies any recent medication changes. She is on a stable dose of prednisone 5 mg. She did start Embrel in December for her rheumatoid arthritis.  The patient is not on any anticoagulant therapy. She endorses pain to the right side of her face without vision changes or focal neurologic deficit. She denies any fevers or recent illnesses. Past Medical History  Diagnosis Date  . Headache(784.0)     migraines  . Orthostatic syncope   . Arthritis     inactive RA- no meds  . IBS (irritable bowel syndrome)   . Dysrhythmia     PVCs  . Depression   . Anxiety   . Hemochromatosis   . Rheumatoid arthritis(714.0) 02/04/2012   Past Surgical History  Procedure Laterality Date  . Patent foramen ovale closure  2008  . Cholecystectomy  1989  . Dilation and curettage of uterus  2004  . Laparoscopy    . Laparoscopic tubal ligation  10/26/2010    Procedure: LAPAROSCOPIC TUBAL LIGATION;  Surgeon: Zelphia Cairo;  Location: [facility] OR[facility];  Service: Gynecology;  Laterality: Bilateral;   Family History  Problem Relation Age of Onset  . Hemochromatosis Mother   . Hemochromatosis Sister    History  Substance Use Topics  . Smoking status: Never Smoker   . Smokeless tobacco: Never Used  . Alcohol Use: Yes      Comment: glass wine per month   OB History   Grav Para Term Preterm Abortions TAB SAB Ect Mult Living                 Review of Systems All other review of systems negative except notable below  Constitutional: Denies fever HEENT: Denies vision changes, blurry vision.  +facial pain Cardiovascular: Denies chest pain Pulmonary: Denies shortness of breath Abdominal: Denies any abdominal pain, nausea, vomiting GU: Denies any dysuria Skin: Denies rash Neuro: Positive for syncope, denies any headache, dizziness Psych: Denies racing thoughts Allergies  Leflunomide; Methotrexate derivatives; Remicade; Morphine and related; and Sulfonamide derivatives  Home Medications  No current outpatient prescriptions on file. BP 116/77  Pulse 58  Temp(Src) 97.9 F (36.6 C) (Oral)  Resp 16  SpO2 98% Physical Exam All vital signs and nursing notes reviewed General: Well-developed, well-nourished no acute distress HEENT: Patient has a contusion over the right cheek and abrasion on the right chin, no hemotympanum noted, bite is symmetrical Cardiovascular: Regular rate and rhythm, no murmurs rubs gallops Pulmonary: Good breath sounds bilaterally Abdominal: Soft, nontender, no rebound or guarding GU: Deferred Neuro: Cranial nerves intact, good dysmetria noted, negative Romberg, full-strength in all extremities Skin: No rash Psych: Normal mood and affect ED Course   Procedures (including critical care time)  Labs Reviewed  CBC WITH DIFFERENTIAL - Abnormal; Notable for the following:    RBC 3.62 (*)  HCT 35.0 (*)    MCH 34.5 (*)    Neutrophils Relative % 42 (*)    Lymphocytes Relative 48 (*)    All other components within normal limits  BASIC METABOLIC PANEL - Abnormal; Notable for the following:    BUN 5 (*)    Calcium 7.8 (*)    All other components within normal limits  CLOSTRIDIUM DIFFICILE BY PCR  URINALYSIS, ROUTINE W REFLEX MICROSCOPIC  BASIC METABOLIC PANEL  CBC  CORTISOL   POCT PREGNANCY, URINE      1. Orthostatic syncope   2. Rheumatoid arthritis(714.0)     MDM  This is a 47 year old with a history of orthostatic hypotension he reports multiple syncopal episodes and presents today with facial trauma as a result. The patient is nontoxic-appearing on exam. Her blood pressure was noted to be in the 80 systolic upon arrival. Orthostatics are positive with both rise in her heart rate and dropped her blood pressure. The patient was also severely symptomatic. Basic lab work was obtained and is unremarkable. EKG is also unremarkable and shows no evidence of arrhythmia or interval prolongation. Given that the patient is not currently on any anticoagulants and is neurologically intact, I will defer imaging to the inpatient team. Patient will be admitted for further workup for her recurrent syncope. Etiologies include worsening autonomic dysfunction or adrenal insufficiency.  Shon Baton, MD 10/19/12 [phone]

## 2012-10-18 NOTE — Progress Notes (Signed)
[facility] PROGRESS NOTE  Jennifer [name] ZOX:[id number] DOB: 02/07/1966 DOA: 10/17/2012 PCP: Juan Quam, MD  Assessment/Plan  Orthostatic hypotension, partially resolved with IVF.  Dehydration is likely only part of the problem.  I think she has some polypharmacy contributing to her hypotension.  Interesting that she was bradycardic during her hypotensive episode at admission.  Part vasovagal syncope?  Possibly related to pain? -  Discontinue IVF (appears euvolemic) -  Decrease klonopin -  Discontinue gabapentin -  TED hose -  Extra salt with meals -  F/u cortisol level -  Continue prednisone at current dose -  Consider addition of midodrine -  PT to give instructions about falls risks -  If still hypotensive tomorrow despite these changes, will start midodrine -  Patient to follow up with Cardiologist on Tuesday at already scheduled appointment  Fibromyalgia, likely related to RA and poor sleep hygiene -  Counseled avoidance of screen time for at least 1 hour before bed and overnight. -  Low lighting before bed -  Do not go to bed as early, try 10PM and get up at 6AM -  No napping during the day -  Daily low impact exercise -  Tizanidine 8mg  before bed trial -  Consider referral to sleep center for additional assistance if lifestyle changes not working. -  Improving sleep quality can drastically reduce fibromyalgia symptom  Migraine headache -  Toradol/benadryl/zofran prn  Restless legs -  Yoga with good hamstring stretches before bed  RA, per rheum, continue Enbrel Hemochromatosis, per hematologist Chronic diarrhea and IBS.  C. Diff negative.  HIV pending Depression/anxiety:  Continue prystique.  Diet:  Regular with extra salt Access:  PIV IVF:  OFF Proph:  lovenox  Code Status: full Family Communication: with patient alone Disposition Plan:  Tomorrow.     Consultants:  None  Procedures:  CT head  Antibiotics:  None    HPI/Subjective:  Migraine headache.  Feeling a little better today, but still intermittently hypotensive.    Objective: Filed Vitals:   10/18/12 1411 10/18/12 1413 10/18/12 1414 10/18/12 1417  BP: 97/59 87/62 92/65  101/65  Pulse: 78 83 92 93  Temp: 98.4 F (36.9 C)     TempSrc: Oral     Resp: 16     SpO2: 100% 100% 100% 98%    Intake/Output Summary (Last 24 hours) at 10/18/12 1707 Last data filed at 10/18/12 1409  Gross per 24 hour  Intake    600 ml  Output   1150 ml  Net   -550 ml   There were no vitals filed for this visit.  Exam:   General:  Thin CF, No acute distress, multiple bruises on race, abrasion right cheek, black eye.  HEENT:  NCAT, MMM  Cardiovascular:  RRR, nl S1, S2 no mrg, 2+ pulses, warm extremities  Respiratory:  CTAB, no increased WOB  Abdomen:   NABS, soft, NT/ND  MSK:   Normal tone and bulk, no LEE  Neuro:  Grossly intact  Psych:  Mostly flat affect, but smiles occasionally  Data Reviewed: Basic Metabolic Panel:  Recent Labs Lab 10/17/12 1855 10/18/12 0545  NA 141 139  K 3.8 3.7  CL 112 111  CO2 22 21  GLUCOSE 92 117*  BUN 5* 5*  CREATININE 0.76 0.75  CALCIUM 7.8* 8.2*   Liver Function Tests: No results found for this basename: AST, ALT, ALKPHOS, BILITOT, PROT, ALBUMIN,  in the last 168 hours No results found for this basename: LIPASE, AMYLASE,  in the last 168 hours No results found for this basename: AMMONIA,  in the last 168 hours CBC:  Recent Labs Lab 10/17/12 1621 10/18/12 0545  WBC 5.6 8.9  NEUTROABS 2.4  --   HGB 12.5 11.9*  HCT 35.0* 33.7*  MCV 96.7 96.3  PLT 157 152   Cardiac Enzymes: No results found for this basename: CKTOTAL, CKMB, CKMBINDEX, TROPONINI,  in the last 168 hours BNP (last 3 results) No results found for this basename: PROBNP,  in the last 8760 hours CBG: No results found for this basename: GLUCAP,  in the last 168 hours  Recent Results (from the past 240 hour(s))  CLOSTRIDIUM  DIFFICILE BY PCR     Status: None   Collection Time    10/18/12  1:27 AM      Result Value Range Status   C difficile by pcr NEGATIVE  NEGATIVE Final     Studies: Ct Head Wo Contrast  10/18/2012   *RADIOLOGY REPORT*  Clinical Data: 47 year old female with syncope, fall, headache.  CT HEAD WITHOUT CONTRAST  Technique:  Contiguous axial images were obtained from the base of the skull through the vertex without contrast.  Comparison: Brain MRI 03/16/2010.  Head CT 03/11/2010.  Findings: Visualized paranasal sinuses and mastoids are clear. Visualized orbit soft tissues are within normal limits.  Visualized scalp soft tissues are within normal limits.  No acute osseous abnormality identified.  Cerebral volume is within normal limits for age.  No midline shift, ventriculomegaly, mass effect, evidence of mass lesion, intracranial hemorrhage or evidence of cortically based acute infarction.  Gray-white matter differentiation is within normal limits throughout the brain.  No suspicious intracranial vascular hyperdensity.  IMPRESSION: Stable and normal noncontrast CT appearance of the brain.   Original Report Authenticated By: Erskine Speed, M.D.    Scheduled Meds: . [START ON 10/19/2012] clonazePAM  1 mg Oral Daily  . desvenlafaxine  100 mg Oral Daily  . [START ON 10/20/2012] etanercept  50 mg Subcutaneous Weekly  . predniSONE  5 mg Oral Daily  . sodium chloride  3 mL Intravenous Q12H  . tiZANidine  8 mg Oral QHS  . topiramate  300 mg Oral Daily   Continuous Infusions:   Principal Problem:   Orthostatic syncope Active Problems:   Irritable bowel syndrome   HEMOCHROMATOSIS, HX OF   Rheumatoid arthritis(714.0)    Time spent: 30 min    Jennifer [name], [facility]  [facility] Pager [phone]. If 7PM-7AM, please contact night-coverage at [email or web address], password [facility] 10/18/2012, 5:07 PM  LOS: 1 day

## 2012-10-18 NOTE — H&P (Signed)
[facility] History and Physical  Carnelia Oscar [place]house XBM:[id number] DOB: 12-08-1965 DOA: 10/17/2012  Referring physician: ER physician. PCP: Juan Quam, MD  Specialist - Dr. Clide Cliff. Cardiologist.  Chief Complaint: Loss of consciousness.  HPI: Jennifer [name] is a 47 y.o. female with history of hemachromatosis, irritable bowel syndrome and chronic diarrhea, rheumatoid arthritis and known history of dysautonomia with syncopal episodes followed by Dr. Clide Cliff, [place] cardiologist presents with complaints of loss of consciousness. Patient states that her syncopal episodes that include but also last one month she has had recurrent episodes. She has been having episodes almost every day now. Sometimes the episodes are preceded by a sensation of things getting dark after which patient's falls. Patient will be her syncopal episode on standing. Denies any associated palpitations incontinence of bowel or any tongue bite. Today patient had syncopal episode and also bruised her jaw area of the right side. He syncopal episodes only last for a few seconds and patient regains consciousness spontaneously. In the ER patient's blood pressure was found to be in the 80s systolic which improved with 4 L normal saline bolus. Patient has been admitted for further observation and management. Patient denies any chest pain or shortness of breath. Patient has chronic diarrhea. Patient has been recently worked up for weight loss.  Review of Systems: As presented in the history of presenting illness, rest negative.  Past Medical History  Diagnosis Date  . Headache(784.0)     migraines  . Orthostatic syncope   . Arthritis     inactive RA- no meds  . IBS (irritable bowel syndrome)   . Dysrhythmia     PVCs  . Depression   . Anxiety   . Hemochromatosis   . Rheumatoid arthritis(714.0) 02/04/2012   Past Surgical History  Procedure Laterality Date  . Patent foramen ovale closure  2008  . Cholecystectomy  1989  .  Dilation and curettage of uterus  2004  . Laparoscopy    . Laparoscopic tubal ligation  10/26/2010    Procedure: LAPAROSCOPIC TUBAL LIGATION;  Surgeon: Zelphia Cairo;  Location: [facility];  Service: Gynecology;  Laterality: Bilateral;   Social History:  reports that she has never smoked. She has never used smokeless tobacco. She reports that  drinks alcohol. Her drug history is not on file. Home. where does patient live-- Can do ADLs. Can patient participate in ADLs?  Allergies  Allergen Reactions  . Leflunomide Diarrhea  . Methotrexate Derivatives Hives  . Remicade [Infliximab] Hives, Diarrhea and Other (See Comments)    Increase heart rate,decrease blood pressure  . Morphine And Related Rash    Rxn to IV morphine; has never had PO morphine  . Sulfonamide Derivatives Rash    Family History  Problem Relation Age of Onset  . Hemochromatosis Mother   . Hemochromatosis Sister       Prior to Admission medications   Medication Sig Start Date End Date Taking? Authorizing Provider  clonazePAM (KLONOPIN) 1 MG tablet Take 1 tablet (1 mg total) by mouth 4 (four) times daily. For anxiety 07/24/12  Yes Sanjuana Kava, NP  desvenlafaxine (PRISTIQ) 100 MG 24 hr tablet Take 1 tablet (100 mg total) by mouth daily. For depression 07/24/12  Yes Sanjuana Kava, NP  dextran 70-hypromellose (TEARS RENEWED) ophthalmic solution Place 2 drops into both eyes as needed (dry eyes).   Yes Historical Provider, MD  etanercept (ENBREL) 50 MG/ML injection Inject 50 mg into the skin once a week.   Yes Historical Provider, MD  gabapentin (NEURONTIN) 300 MG capsule Take 300 mg by mouth 3 (three) times daily.   Yes Historical Provider, MD  ibuprofen (ADVIL,MOTRIN) 200 MG tablet Take 4 tablets (800 mg total) by mouth every 8 (eight) hours as needed (for migraine). 07/24/12  Yes Sanjuana Kava, NP  ondansetron (ZOFRAN) 4 MG tablet Take 1 tablet (4 mg total) by mouth daily as needed for nausea. 07/24/12  Yes Sanjuana Kava, NP   predniSONE (DELTASONE) 5 MG tablet Take 1 tablet (5 mg total) by mouth daily. For inflammation 07/24/12  Yes Sanjuana Kava, NP  ramelteon (ROZEREM) 8 MG tablet Take 8 mg by mouth at bedtime as needed for sleep.   Yes Historical Provider, MD  tiZANidine (ZANAFLEX) 4 MG capsule Take 4 mg by mouth 3 (three) times daily as needed (migraine).   Yes Historical Provider, MD  topiramate (TOPAMAX) 100 MG tablet Take 3 tablets (300 mg total) by mouth daily. For mood stabilization 07/24/12  Yes Sanjuana Kava, NP   Physical Exam: Filed Vitals:   10/17/12 2030 10/17/12 2045 10/17/12 2100 10/17/12 2157  BP: 103/59 102/68 102/67 116/77  Pulse: 65 70 66 58  Temp:    97.9 F (36.6 C)  TempSrc:      Resp: 14 13 12 16   SpO2: 100% 100% 100% 98%     General:  Well-developed poorly nourished.  Eyes: Anicteric no pallor.  ENT: Bruise on the right jaw area.  Neck: No mass felt.  Cardiovascular: S1-S2 heard.  Respiratory: No rhonchi or crepitations.  Abdomen: Soft nontender bowel sounds present.  Skin: No rash. Bruise on the face.  Musculoskeletal: No edema.  Psychiatric: Appears normal.  Neurologic: Alert awake oriented to time place and person. Moves all extremities.  Labs on Admission:  Basic Metabolic Panel:  Recent Labs Lab 10/17/12 1855  NA 141  K 3.8  CL 112  CO2 22  GLUCOSE 92  BUN 5*  CREATININE 0.76  CALCIUM 7.8*   Liver Function Tests: No results found for this basename: AST, ALT, ALKPHOS, BILITOT, PROT, ALBUMIN,  in the last 168 hours No results found for this basename: LIPASE, AMYLASE,  in the last 168 hours No results found for this basename: AMMONIA,  in the last 168 hours CBC:  Recent Labs Lab 10/17/12 1621  WBC 5.6  NEUTROABS 2.4  HGB 12.5  HCT 35.0*  MCV 96.7  PLT 157   Cardiac Enzymes: No results found for this basename: CKTOTAL, CKMB, CKMBINDEX, TROPONINI,  in the last 168 hours  BNP (last 3 results) No results found for this basename: PROBNP,  in  the last 8760 hours CBG: No results found for this basename: GLUCAP,  in the last 168 hours  Radiological Exams on Admission: Ct Head Wo Contrast  10/18/2012   *RADIOLOGY REPORT*  Clinical Data: 47 year old female with syncope, fall, headache.  CT HEAD WITHOUT CONTRAST  Technique:  Contiguous axial images were obtained from the base of the skull through the vertex without contrast.  Comparison: Brain MRI 03/16/2010.  Head CT 03/11/2010.  Findings: Visualized paranasal sinuses and mastoids are clear. Visualized orbit soft tissues are within normal limits.  Visualized scalp soft tissues are within normal limits.  No acute osseous abnormality identified.  Cerebral volume is within normal limits for age.  No midline shift, ventriculomegaly, mass effect, evidence of mass lesion, intracranial hemorrhage or evidence of cortically based acute infarction.  Gray-white matter differentiation is within normal limits throughout the brain.  No suspicious intracranial vascular  hyperdensity.  IMPRESSION: Stable and normal noncontrast CT appearance of the brain.   Original Report Authenticated By: Erskine Speed, M.D.     Assessment/Plan Principal Problem:   Orthostatic syncope Active Problems:   Irritable bowel syndrome   HEMOCHROMATOSIS, HX OF   Rheumatoid arthritis(714.0)   1. Orthostatic hypotension with syncope - patient's blood pressure improved with fluids and we will continue gentle hydration for now as patient states her blood pressure is still in slow. Recheck orthostatics in a.m. Check cortisol levels. I have ordered one dose of hydrocortisone 50 mg IV as patient is on steroids. Patient has followup appointment with Dr. Clide Cliff next Tuesday. CT head is pending. Follow EKG. 2. Rheumatoid arthritis - continue present medications. 3. Hemochromatosis - followed by hematologist. 4. Chronic diarrhea and irritable bowel syndrome - check C. difficile. Check HIV.    Code Status: Full code.  Family  Communication: None.  Disposition Plan: Admit for observation.    Tabatha Razzano N. [facility] Pager [phone].  If 7PM-7AM, please contact night-coverage [email or web address] Password [facility] 10/18/2012, 4:01 AM

## 2012-10-19 DIAGNOSIS — G4700 Insomnia, unspecified: Secondary | ICD-10-CM

## 2012-10-19 DIAGNOSIS — G2581 Restless legs syndrome: Secondary | ICD-10-CM

## 2012-10-19 DIAGNOSIS — M797 Fibromyalgia: Secondary | ICD-10-CM

## 2012-10-19 MED ORDER — SODIUM CHLORIDE 0.9 % IV BOLUS (SEPSIS): 1000.0000 mL | Freq: Once | INTRAVENOUS | Status: DC

## 2012-10-19 MED ORDER — SODIUM CHLORIDE 0.9 % IV BOLUS (SEPSIS)
500.0000 mL | Freq: Once | INTRAVENOUS | Status: AC
  Administered 2012-10-19: 500 mL via INTRAVENOUS

## 2012-10-19 MED ORDER — MIDODRINE HCL 5 MG PO TABS
5.0000 mg | ORAL_TABLET | Freq: Three times a day (TID) | ORAL | Status: DC
  Administered 2012-10-19 – 2012-10-20 (×4): 5 mg via ORAL
  Filled 2012-10-19 (×7): qty 1

## 2012-10-19 NOTE — Progress Notes (Signed)
Utilization review completed.  

## 2012-10-19 NOTE — Progress Notes (Signed)
Pt had positive orthostatics this morning. Md on call made aware. New order received. Will cont to monitor pt.

## 2012-10-19 NOTE — Progress Notes (Signed)
[facility] PROGRESS NOTE  Jennifer [name] Jennifer [name] ZHY:[id number] DOB: [birth year] DOA: 10/17/2012 PCP: Juan Quam, MD  Assessment/Plan  Orthostatic vs. vasovagal hypotension, partially resolved with IVF.  Dehydration is likely only part of the problem.  I think she has some polypharmacy contributing to her hypotension.  Interesting that she was bradycardic during her hypotensive episode at admission.  Severely hypotensive this morning with orthostatics and felt lightheaded despite holding gabapentin, adding TED hose, and increasing salt in diet. -  IVF bolus -  Start midodrine -  Continue TED hose, extra salt with meals -  Gabapentin discontinued -  Extra salt with meals -  cortisol level 27 -  Continue prednisone at current dose -  Consider addition of midodrine -  PT to give instructions about falls risks  Fibromyalgia, likely related to RA and poor sleep hygiene -  Counseled avoidance of screen time for at least 1 hour before bed and overnight. -  Low lighting before bed -  Do not go to bed as early, try 10PM and get up at 6AM -  No napping during the day -  Daily low impact exercise -  Tizanidine 8mg  before bed trial -  Consider referral to sleep center for additional assistance if lifestyle changes not working. -  Improving sleep quality can drastically reduce fibromyalgia symptom  Migraine headache -  Toradol/benadryl/zofran prn  Restless legs -  Yoga with good hamstring stretches before bed  RA, per rheum, continue Enbrel Hemochromatosis, per hematologist Chronic diarrhea and IBS.  C. Diff negative.  HIV NR Depression/anxiety:  Continue prystique.  Diet:  Regular with extra salt Access:  PIV IVF:  OFF Proph:  lovenox  Code Status: full Family Communication: with patient alone Disposition Plan:  Tomorrow morning post orthostatics.  Monitor blood pressure today on midodrine and increase or decrease tomorrow if needed.  Otherwise will follow up with cardiology  Tuesday.    Consultants:  None  Procedures:  CT head  Antibiotics:  None   HPI/Subjective:  Migraine headache.  Feeling a little better today, but still intermittently hypotensive.    Objective: Filed Vitals:   10/19/12 0500 10/19/12 0503 10/19/12 0506 10/19/12 1326  BP: 91/52 81/50 67/52  120/73  Pulse: 73 76 92 72  Temp: 98.2 F (36.8 C)   98 F (36.7 C)  TempSrc:    Oral  Resp: 18   18  Weight:   63.05 kg (139 lb)   SpO2: 99%   100%    Intake/Output Summary (Last 24 hours) at 10/19/12 1631 Last data filed at 10/19/12 1300  Gross per 24 hour  Intake   1260 ml  Output    825 ml  Net    435 ml   Filed Weights   10/19/12 0506  Weight: 63.05 kg (139 lb)    Exam:   General:  Thin CF, No acute distress  HEENT:  NCAT, MMM  Cardiovascular:  RRR, nl S1, S2 no mrg, 2+ pulses, warm extremities  Respiratory:  CTAB, no increased WOB  Abdomen:   NABS, soft, NT/ND  MSK:   Normal tone and bulk, no LEE  Neuro:  Grossly intact  Psych:  Mostly flat affect, but smiles occasionally  Data Reviewed: Basic Metabolic Panel:  Recent Labs Lab 10/17/12 1855 10/18/12 0545  NA 141 139  K 3.8 3.7  CL 112 111  CO2 22 21  GLUCOSE 92 117*  BUN 5* 5*  CREATININE 0.76 0.75  CALCIUM 7.8* 8.2*   Liver Function Tests: No  results found for this basename: AST, ALT, ALKPHOS, BILITOT, PROT, ALBUMIN,  in the last 168 hours No results found for this basename: LIPASE, AMYLASE,  in the last 168 hours No results found for this basename: AMMONIA,  in the last 168 hours CBC:  Recent Labs Lab 10/17/12 1621 10/18/12 0545  WBC 5.6 8.9  NEUTROABS 2.4  --   HGB 12.5 11.9*  HCT 35.0* 33.7*  MCV 96.7 96.3  PLT 157 152   Cardiac Enzymes: No results found for this basename: CKTOTAL, CKMB, CKMBINDEX, TROPONINI,  in the last 168 hours BNP (last 3 results) No results found for this basename: PROBNP,  in the last 8760 hours CBG: No results found for this basename: GLUCAP,  in  the last 168 hours  Recent Results (from the past 240 hour(s))  CLOSTRIDIUM DIFFICILE BY PCR     Status: None   Collection Time    10/18/12  1:27 AM      Result Value Range Status   C difficile by pcr NEGATIVE  NEGATIVE Final     Studies: Ct Head Wo Contrast  10/18/2012   *RADIOLOGY REPORT*  Clinical Data: 47 year old female with syncope, fall, headache.  CT HEAD WITHOUT CONTRAST  Technique:  Contiguous axial images were obtained from the base of the skull through the vertex without contrast.  Comparison: Brain MRI 03/16/2010.  Head CT 03/11/2010.  Findings: Visualized paranasal sinuses and mastoids are clear. Visualized orbit soft tissues are within normal limits.  Visualized scalp soft tissues are within normal limits.  No acute osseous abnormality identified.  Cerebral volume is within normal limits for age.  No midline shift, ventriculomegaly, mass effect, evidence of mass lesion, intracranial hemorrhage or evidence of cortically based acute infarction.  Gray-white matter differentiation is within normal limits throughout the brain.  No suspicious intracranial vascular hyperdensity.  IMPRESSION: Stable and normal noncontrast CT appearance of the brain.   Original Report Authenticated By: Erskine Speed, M.D.    Scheduled Meds: . desvenlafaxine  100 mg Oral Daily  . [START ON 10/20/2012] etanercept  50 mg Subcutaneous Weekly  . midodrine  5 mg Oral TID WC  . predniSONE  5 mg Oral Daily  . sodium chloride  3 mL Intravenous Q12H  . tiZANidine  8 mg Oral QHS  . topiramate  300 mg Oral Daily   Continuous Infusions:   Principal Problem:   Orthostatic syncope Active Problems:   Irritable bowel syndrome   HEMOCHROMATOSIS, HX OF   Rheumatoid arthritis(714.0)    Time spent: 30 min    Jennifer [name], [facility]  [facility] Pager [phone]. If 7PM-7AM, please contact night-coverage at [email or web address], password [facility] 10/19/2012, 4:31 PM  LOS: 2 days

## 2012-10-20 MED ORDER — MIDODRINE HCL 5 MG PO TABS: 5.0000 mg | ORAL_TABLET | Freq: Three times a day (TID) | ORAL | Status: DC

## 2012-10-20 MED ORDER — TIZANIDINE HCL 4 MG PO TABS: 8.0000 mg | ORAL_TABLET | Freq: Every day | ORAL | Status: DC

## 2012-10-20 MED ORDER — CLONAZEPAM 1 MG PO TABS: 1.0000 mg | ORAL_TABLET | Freq: Every day | ORAL | Status: DC | PRN

## 2012-10-20 NOTE — Discharge Summary (Addendum)
Physician Discharge Summary  Jennifer [name] WUJ:[id number] DOB: 1966-02-24 DOA: 10/17/2012  PCP: Juan Quam, MD  Admit date: 10/17/2012 Discharge date: 10/20/2012  Recommendations for Outpatient Follow-up:  1. Follow up with Dr. Clide Cliff on 8/19 at already scheduled appointment for orthostatic hypotension.   2. Primary care doctor in 2 weeks for reevaluation after stopping gabapentin, decreasing clonazepam and implementing lifestyle changes for symptoms.    Discharge Diagnoses:  Principal Problem:   Orthostatic syncope Active Problems:   Irritable bowel syndrome   HEMOCHROMATOSIS, HX OF   Rheumatoid arthritis(714.0)   Fibromyalgia   Restless legs syndrome (RLS)   Insomnia   Discharge Condition: stable, improved  Diet recommendation: regular, high salt, minimum of 2 to 2.5L of fluid daily  Wt Readings from Last 3 Encounters:  10/19/12 63.05 kg (139 lb)  07/21/12 60.782 kg (134 lb)  02/04/12 61.598 kg (135 lb 12.8 oz)    History of present illness:   Jennifer [name] is a 47 y.o. female with history of hemachromatosis, irritable bowel syndrome and chronic diarrhea, rheumatoid arthritis and known history of dysautonomia with syncopal episodes followed by Dr. Clide Cliff, [place] cardiologist presents with complaints of loss of consciousness. Patient states that her syncopal episodes that include but also last one month she has had recurrent episodes. She has been having episodes almost every day now. Sometimes the episodes are preceded by a sensation of things getting dark after which patient's falls. Patient will be her syncopal episode on standing. Denies any associated palpitations incontinence of bowel or any tongue bite. Today patient had syncopal episode and also bruised her jaw area of the right side. He syncopal episodes only last for a few seconds and patient regains consciousness spontaneously. In the ER patient's blood pressure was found to be in the 80s systolic which improved with  4 L normal saline bolus. CT head negative for acute hemorrhage.  Patient has been admitted for further observation and management. Patient denies any chest pain or shortness of breath. Patient has chronic diarrhea. Patient has been recently worked up for weight loss.  Hospital Course:   Orthostatic vs. vasovagal hypotension, partially improved initially with IVF.  Dehydration is likely only part of the problem. I think she has some polypharmacy contributing to her hypotension, although perhaps some dysautonomia may be related to her RA.  Interesting that she was bradycardic to the 40s during her initial hypotensive episode at admission.  She remained severely orthostatic (standing BP dropped to 67/52, pulse 90s and symptomatic) despite holding gabapentin, weaning clonazepam, adding TED hose, IVF, and increasing salt in diet.  She was started on midodrine 5mg  TID, which improved her blood pressures to the 100-120/67-73 range.  Repeat orthostatics were still mildly positive by numbers:  HR 65 -> 80 -> 87 and BP 106/70 ->100/67 -> 88/59, however, she was asymptomatic with standing up.  She felt more stable.  She was assessed by physical therapy who assisted with information to protect against injury during falls.  Her cortisol level was 27 and she was continued on prednisone 5mg  daily.  She may benefit from an increase in her midodrine as an outpatient.  Please consider if she continues to have problems with falls.  Cardiology follow up for consideration of pacer if symptoms continue, but would wait to make any decisions until gabapentin has been discontinued for a minimum of a few weeks.  Telemetry demonstrated NSR with occasionally ectopic atrial beats and junctional escape beats, rate in the 70s-80s.  Also occasional PVCs.  Fibromyalgia, likely related to RA and poor sleep hygiene.  Counseled avoidance of screen time for at least 1 hour before bed and overnight.  Low lighting before bed.  Do not go to bed as  early, try 10PM and get up at 6AM.  No napping during the day and daily low impact exercise during the day.  Tizanidine 8mg  before bed.  Consider referral to sleep center for additional assistance if lifestyle changes not working.  Improving sleep quality can drastically reduce fibromyalgia symptoms.  Migraine headache.  Toradol/benadryl/zofran prn while in hospital.    Restless legs:  Yoga with good hamstring stretches before bed.   RA, per rheum, continue Enbrel  Hemochromatosis, per hematologist  Chronic diarrhea and IBS. C. Diff negative. HIV NR  Depression/anxiety: Continued pristiq.   Consultants:  None Procedures:  CT head Antibiotics:  None    Discharge Exam: Filed Vitals:   10/20/12 0506  BP: 88/59  Pulse: 87  Temp:   Resp:    Filed Vitals:   10/19/12 2100 10/20/12 0500 10/20/12 0503 10/20/12 0506  BP: 114/73 106/70 100/67 88/59  Pulse: 73 65 80 87  Temp: 98.5 F (36.9 C) 98 F (36.7 C)    TempSrc:      Resp: 16 16    Weight:      SpO2: 99% 100%     Feels well.  Ambulating to bathroom wtihout lightheadedness or dizziness.  Stool with orthostatics today symptom free.    General: Thin CF, No acute distress, abrasion and bruises on right face continuing to heal  HEENT: NCAT, MMM  Cardiovascular: RRR, nl S1, S2 no mrg, 2+ pulses, warm extremities  Respiratory: CTAB, no increased WOB  Abdomen: NABS, soft, NT/ND  MSK: Normal tone and bulk, no LEE  Neuro: Grossly intact  Psych: Mostly flat affect, but smiles occasionally   Discharge Instructions      Discharge Orders   Future Appointments Provider Department Dept Phone   10/21/2012 2:30 PM Duke Salvia, MD [facility] Main Office [place]) [phone]   11/10/2012 12:00 PM Krista Blue [facility] [phone]   01/19/2013 12:00 PM Windell Hummingbird [facility] [phone]   01/26/2013 12:30 PM Levert Feinstein, MD [place]  [facility] [phone]   Future Orders Complete By Expires   Call MD for:  difficulty breathing, headache or visual disturbances  As directed    Call MD for:  extreme fatigue  As directed    Call MD for:  hives  As directed    Call MD for:  persistant dizziness or light-headedness  As directed    Call MD for:  persistant nausea and vomiting  As directed    Call MD for:  severe uncontrolled pain  As directed    Call MD for:  temperature >100.4  As directed    Diet general  As directed    Comments:     High salt, 2-2.5L fluids   Discharge instructions  As directed    Comments:     You were hospitalized with passing out spells.  You were tested for heart rhythm problems and should follow up with Dr. Clide Cliff tomorrow for further evaluation.  Your clonazepam was decreased to once daily and your gabapentin was discontinued because it is notorious for causing orthostatic hypotension or fainting spells.  Please wear TED hose during the day, drink at least 2-2.5L of fluid daily and add extra salt to your  foods.  We started midodrine three times a day for your blood pressure also, which has helped so far.  You may need to have the dose increased as an outpatient if you are continuing to have fainting spells.  Please talk to Dr. Clide Cliff or your primary care doctor if this is the case.  Please be reevaluated a few weeks from now by your primary care doctor to talk about your fainting spells as I am hoping they will get better the longer you are off the gabapentin - it can take a week or more sometimes for this medication to get out of your system.  For your fibromyalgia, it is critical that you improve your sleep habits:  avoidance of screen time for at least 1 hour before bed and overnight.  Low lighting before bed.  Do not go to bed as early, try 10PM and get up at 6AM.  No napping during the day and daily low impact exercise during the day.  Tizanidine 8mg  before bed.  Consider referral to  sleep center for additional assistance if lifestyle changes not working.  Improving sleep quality can drastically reduce fibromyalgia symptoms.  For your restless legs, try stretching yoga poses of the back and hamstrings before bed:  Downward dog, cobra/upward dog, cat stretch/camel, and bending over to touch your toes, etc.  Feel better!   Increase activity slowly  As directed        Medication List    STOP taking these medications       gabapentin 300 MG capsule  Commonly known as:  NEURONTIN      TAKE these medications       clonazePAM 1 MG tablet  Commonly known as:  KLONOPIN  Take 1 tablet (1 mg total) by mouth daily as needed for anxiety. For anxiety     desvenlafaxine 100 MG 24 hr tablet  Commonly known as:  PRISTIQ  Take 1 tablet (100 mg total) by mouth daily. For depression     dextran 70-hypromellose ophthalmic solution  Place 2 drops into both eyes as needed (dry eyes).     ENBREL 50 MG/ML injection  Generic drug:  etanercept  Inject 50 mg into the skin once a week.     ibuprofen 200 MG tablet  Commonly known as:  ADVIL,MOTRIN  Take 4 tablets (800 mg total) by mouth every 8 (eight) hours as needed (for migraine).     midodrine 5 MG tablet  Commonly known as:  PROAMATINE  Take 1 tablet (5 mg total) by mouth 3 (three) times daily with meals.     ondansetron 4 MG tablet  Commonly known as:  ZOFRAN  Take 1 tablet (4 mg total) by mouth daily as needed for nausea.     predniSONE 5 MG tablet  Commonly known as:  DELTASONE  Take 1 tablet (5 mg total) by mouth daily. For inflammation     ramelteon 8 MG tablet  Commonly known as:  ROZEREM  Take 8 mg by mouth at bedtime as needed for sleep.     tiZANidine 4 MG tablet  Commonly known as:  ZANAFLEX  Take 2 tablets (8 mg total) by mouth at bedtime.     tiZANidine 4 MG capsule  Commonly known as:  ZANAFLEX  Take 4 mg by mouth 3 (three) times daily as needed (migraine).     topiramate 100 MG tablet  Commonly  known as:  TOPAMAX  Take 3 tablets (300 mg total) by mouth daily. For  mood stabilization       Follow-up Information   Follow up with [facility], MD. Schedule an appointment as soon as possible for a visit in 2 weeks.   Specialty:  Family Medicine      Follow up with Sherryl Manges, MD On 10/21/2012.   Specialty:  Cardiology   Contact information:   [address] [place] [address] [phone]       The results of significant diagnostics from this hospitalization (including imaging, microbiology, ancillary and laboratory) are listed below for reference.    Significant Diagnostic Studies: Ct Head Wo Contrast  10/18/2012   *RADIOLOGY REPORT*  Clinical Data: 47 year old female with syncope, fall, headache.  CT HEAD WITHOUT CONTRAST  Technique:  Contiguous axial images were obtained from the base of the skull through the vertex without contrast.  Comparison: Brain MRI 03/16/2010.  Head CT 03/11/2010.  Findings: Visualized paranasal sinuses and mastoids are clear. Visualized orbit soft tissues are within normal limits.  Visualized scalp soft tissues are within normal limits.  No acute osseous abnormality identified.  Cerebral volume is within normal limits for age.  No midline shift, ventriculomegaly, mass effect, evidence of mass lesion, intracranial hemorrhage or evidence of cortically based acute infarction.  Gray-white matter differentiation is within normal limits throughout the brain.  No suspicious intracranial vascular hyperdensity.  IMPRESSION: Stable and normal noncontrast CT appearance of the brain.   Original Report Authenticated By: Erskine Speed, M.D.    Microbiology: Recent Results (from the past 240 hour(s))  CLOSTRIDIUM DIFFICILE BY PCR     Status: None   Collection Time    10/18/12  1:27 AM      Result Value Range Status   C difficile by pcr NEGATIVE  NEGATIVE Final     Labs: Basic Metabolic Panel:  Recent Labs Lab 10/17/12 1855 10/18/12 0545   NA 141 139  K 3.8 3.7  CL 112 111  CO2 22 21  GLUCOSE 92 117*  BUN 5* 5*  CREATININE 0.76 0.75  CALCIUM 7.8* 8.2*   Liver Function Tests: No results found for this basename: AST, ALT, ALKPHOS, BILITOT, PROT, ALBUMIN,  in the last 168 hours No results found for this basename: LIPASE, AMYLASE,  in the last 168 hours No results found for this basename: AMMONIA,  in the last 168 hours CBC:  Recent Labs Lab 10/17/12 1621 10/18/12 0545  WBC 5.6 8.9  NEUTROABS 2.4  --   HGB 12.5 11.9*  HCT 35.0* 33.7*  MCV 96.7 96.3  PLT 157 152   Cardiac Enzymes: No results found for this basename: CKTOTAL, CKMB, CKMBINDEX, TROPONINI,  in the last 168 hours BNP: BNP (last 3 results) No results found for this basename: PROBNP,  in the last 8760 hours CBG: No results found for this basename: GLUCAP,  in the last 168 hours  Time coordinating discharge: 45 minutes  Signed:  Naphtali [name]  [facility] 10/20/2012, 9:54 AM

## 2012-10-20 NOTE — Progress Notes (Signed)
30-Oct-2012 1630  PT G-Codes **NOT FOR INPATIENT CLASS**  Functional Assessment Tool Used clinician expeirence  Functional Limitation Mobility: Walking and moving around  Mobility: Walking and Moving Around Current Status (Z6109) CI  Mobility: Walking and Moving Around Goal Status [phone]) CI  Mobility: Walking and Moving Around Discharge Status [phone]) CI  late g-codes entered for eval on 2012/10/30 Sussie Minor B. Massa Pe, PT, DPT [phone]

## 2012-10-21 ENCOUNTER — Ambulatory Visit (INDEPENDENT_AMBULATORY_CARE_PROVIDER_SITE_OTHER): Payer: 59 | Admitting: Internal Medicine

## 2012-10-21 ENCOUNTER — Encounter: Payer: Self-pay | Admitting: Internal Medicine

## 2012-10-21 VITALS — BP 104/70 | HR 103 | Ht 67.0 in | Wt 137.0 lb

## 2012-10-21 DIAGNOSIS — I951 Orthostatic hypotension: Secondary | ICD-10-CM

## 2012-10-21 DIAGNOSIS — R000 Tachycardia, unspecified: Secondary | ICD-10-CM

## 2012-10-21 MED ORDER — MIDODRINE HCL 5 MG PO TABS: 5.0000 mg | ORAL_TABLET | Freq: Three times a day (TID) | ORAL | Status: DC

## 2012-10-21 NOTE — Progress Notes (Signed)
migraine

## 2012-10-21 NOTE — Progress Notes (Signed)
Little lightheaded.

## 2012-10-21 NOTE — Patient Instructions (Addendum)
Your physician recommends that you schedule a follow-up appointment in: 6-8 weeks with Dr. [name].  Your physician recommends that you continue on your current medications as directed. Please refer to the Current Medication list given to you today.

## 2012-10-21 NOTE — Progress Notes (Signed)
Patient Care Team: Layne Benton as PCP - General (Family Medicine)   HPI  Jennifer [name] is a 47 y.o. female Seen in followup for orthostatic intolerance and syncope.  It had been many years since I had seen her, prior to the birth of her now 47 yr old  She has a hx of hemochromatosis but this has not been thought ot be instrumental   She had intercurrently been diagnosed with rheumatoid arthritis. From a series of events she ended up speaking started on therapy, stopped on therapy this, and then resumed on therapy which was complicated allergic reactions to methotrexate and Remicade and subsequently being treated with a newer agent.   In the wake of thatshe began to have recurrent syncope. For some period of time she has been staying with her parents with her 2 younger son; her older son has chosen to live with his father  In the last few weeks and she ended up with syncope and facial trauma. She was found to be hypotensive in the emergency room improved with saline.she has significant orthostatic intolerance and persistent hypotensio in hospital and some bradycardia.Marland Kitchen She was treated with ProAmatine.   Family history of hemochromatosis.  Has continued to be a good deal of psychosocial stress including a recent psychiatric admission  Past Medical History  Diagnosis Date  . Headache(784.0)     migraines  . Orthostatic syncope   . Arthritis     inactive RA- no meds  . IBS (irritable bowel syndrome)   . Dysrhythmia     PVCs  . Depression   . Anxiety   . Hemochromatosis   . Rheumatoid arthritis(714.0) 02/04/2012    Past Surgical History  Procedure Laterality Date  . Patent foramen ovale closure  2008  . Cholecystectomy  1989  . Dilation and curettage of uterus  2004  . Laparoscopy    . Laparoscopic tubal ligation  10/26/2010    Procedure: LAPAROSCOPIC TUBAL LIGATION;  Surgeon: Zelphia Cairo;  Location: [facility];  Service: Gynecology;  Laterality: Bilateral;     Current Outpatient Prescriptions  Medication Sig Dispense Refill  . clonazePAM (KLONOPIN) 1 MG tablet Take 1 tablet (1 mg total) by mouth daily as needed for anxiety. For anxiety  28 tablet  0  . desvenlafaxine (PRISTIQ) 100 MG 24 hr tablet Take 1 tablet (100 mg total) by mouth daily. For depression  30 tablet  0  . dextran 70-hypromellose (TEARS RENEWED) ophthalmic solution Place 2 drops into both eyes as needed (dry eyes).      Marland Kitchen etanercept (ENBREL) 50 MG/ML injection Inject 50 mg into the skin once a week.      Marland Kitchen ibuprofen (ADVIL,MOTRIN) 200 MG tablet Take 4 tablets (800 mg total) by mouth every 8 (eight) hours as needed (for migraine).  30 tablet  0  . midodrine (PROAMATINE) 5 MG tablet Take 1 tablet (5 mg total) by mouth 3 (three) times daily with meals.  90 tablet  0  . ondansetron (ZOFRAN) 4 MG tablet Take 1 tablet (4 mg total) by mouth daily as needed for nausea.  20 tablet  0  . predniSONE (DELTASONE) 5 MG tablet Take 1 tablet (5 mg total) by mouth daily. For inflammation      . ramelteon (ROZEREM) 8 MG tablet Take 8 mg by mouth at bedtime as needed for sleep.      Marland Kitchen tiZANidine (ZANAFLEX) 4 MG capsule Take 4 mg by mouth 3 (three) times daily as needed (migraine).      Marland Kitchen  tiZANidine (ZANAFLEX) 4 MG tablet Take 2 tablets (8 mg total) by mouth at bedtime.  60 tablet  0  . topiramate (TOPAMAX) 100 MG tablet Take 3 tablets (300 mg total) by mouth daily. For mood stabilization  30 tablet  0   No current facility-administered medications for this visit.    Allergies  Allergen Reactions  . Leflunomide Diarrhea  . Methotrexate Derivatives Hives  . Reglan [Metoclopramide]     Jerky   . Remicade [Infliximab] Hives, Diarrhea and Other (See Comments)    Increase heart rate,decrease blood pressure  . Morphine And Related Rash    Rxn to IV morphine; has never had PO morphine  . Sulfonamide Derivatives Rash    Review of Systems negative except from HPI and PMH  Physical Exam BP 115/79   Pulse 78  Ht 5\' 7"  (1.702 m)  Wt 137 lb (62.143 kg)  BMI 21.45 kg/m2 Well developed and nourished in no acute distress HENT normal; face bruised Neck supple with JVP-flat Carotids brisk and full without bruits Clear Regular rate and rhythm, no murmurs or gallops Abd-soft with active BS without hepatomegaly No Clubbing cyanosis edema Skin-warm and dry A & Oriented  Grossly normal sensory and motor function    Assessment and  Plan:  Dysautonomic Syncope  Pt with abrupt worsening of symptoms in the context of RA, psychosocial stress and underlying but prob unrelated hemochromatosis  Will use salt water  For now  Discussed physiology extensively

## 2012-10-21 NOTE — Progress Notes (Signed)
Little lightheaded. 

## 2012-10-21 NOTE — Progress Notes (Signed)
Head feels funny

## 2012-10-23 ENCOUNTER — Telehealth: Payer: Self-pay | Admitting: Internal Medicine

## 2012-10-23 NOTE — Telephone Encounter (Signed)
Spoke with pt, sitting this am her bp was 70/50 and then standing it was 58/0. She is having dizziness. Her midodrine was not changed at her office visit Monday. She has not checked her bp since 11am. She cont to be dizzy. Will forward for dr [name] review.

## 2012-10-23 NOTE — Telephone Encounter (Signed)
Forwarding to SK

## 2012-10-23 NOTE — Telephone Encounter (Signed)
New prob  Pt states that her BP is dropping again, standing it was 58 and she could not get a bottom number.

## 2012-10-24 NOTE — Telephone Encounter (Signed)
Follow up  Pt is following up from call on yesterday. She would like to speak with a nurse.

## 2012-10-24 NOTE — Telephone Encounter (Signed)
Spoke with patient who states she is feeling light-headed and dizzy x 2 days and feels like she may pass out when she stands up.  Patient was seen in the office by Dr. [name] on 8/19 for f/u after hospital d/c for orthostatic intolerance.  Patient states she was feeling well in the office Tuesday but since yesterday has hardly been able to stand.  Patient also c/o bilateral severe lower extremity pain.  Patient states she has hx of fibromyalgia and states this leg pain is different BP 66/42, HR 75 sitting; unable to get a standing pressure from machine - states pulse is approximately 106 but she can't stand up long enough to check it.  I inquired into how much water she is drinking; patient reports she is drinking 32 oz of water.  D/c instructions state that patient is to drink at least 2 - 2.5 L of water daily.  I advised patient to increase her fluid intake.  Patient is not wearing TED hose as was recommended on her D/C instructions. Patient states she has the TED hose and she will put them on. I advised patient that I will review her case with Dr. Tenny Craw, DOD as Dr. [name] is not in the office today. Dr. Tenny Craw advised patient come to office today for IV fluid hydration and possible medication adjustment/change.  Patient states she does not have anyone to drive her and wants to come in on Monday.  I advised patient that we would prefer she come in today.  Patient states she will call back if she can get a ride to the office today.  I advised that Dr. Odessa Fleming schedule is full and that I will have to send his nurse a message for permission to add her to Tuesday's schedule.  Patient verbalized understanding and agreement with plan of care.  I let Dr. Tenny Craw know that patient cannot come in today for IV fluid therapy today.  Dr. Tenny Craw advised patient take Midodrine 10 mg in the am on Saturday and 5 mg in the afternoon and again in the evening and to drink lots of fluid and eat more salt this weekend and to call Monday if  her condition does not improve.  Patient verbalized understanding.

## 2012-10-27 NOTE — Telephone Encounter (Signed)
Spoke with pt - she states no change since last week, still dizziness and low BPs. Her BP sitting is around mid 80s systolic, and once standing she drops in 60s systolic. She has increased her water intake to over 64oz/day and increased her salt intake according to her. She has also been wearing her TED hose. She states she has been taking Midodrine 20mg  a day since Friday with no improvement. She explained that her mother was there with her, so she isn't alone with dizziness. C/o slight tight CP that she isn't sure if anxiety related or not, this occurred around 1am and 3pm today - it has not remained constant in nature (I advised pt that if continued, worsened, or remained constant to go to ED).  Plan is to discuss with Dr. [name] tomorrow and get back with her. Pt is agreeable to plan

## 2012-10-27 NOTE — Telephone Encounter (Signed)
Follow up   Pt stated she didn't get any better over the weekend and need to speak to nurse concerning this matter. Sitting her bp was 66/45. Please call pt.

## 2012-10-28 NOTE — Telephone Encounter (Signed)
Pt still having issues, per Dr. [name] - made appointment for her to come in tomorrow 8/27 to see him

## 2012-10-28 NOTE — Telephone Encounter (Signed)
Follow up   Pt waiting for nurse to call her back.

## 2012-10-28 NOTE — Telephone Encounter (Signed)
Follow up  Pt is calling concerning her BP. She asked if you could call her back.

## 2012-10-29 ENCOUNTER — Encounter: Payer: Self-pay | Admitting: Internal Medicine

## 2012-10-29 ENCOUNTER — Ambulatory Visit (INDEPENDENT_AMBULATORY_CARE_PROVIDER_SITE_OTHER): Payer: 59 | Admitting: Internal Medicine

## 2012-10-29 ENCOUNTER — Other Ambulatory Visit: Payer: Self-pay

## 2012-10-29 VITALS — BP 72/49 | HR 64 | Ht 67.0 in | Wt 134.4 lb

## 2012-10-29 DIAGNOSIS — R55 Syncope and collapse: Secondary | ICD-10-CM

## 2012-10-29 DIAGNOSIS — R000 Tachycardia, unspecified: Secondary | ICD-10-CM

## 2012-10-29 DIAGNOSIS — I951 Orthostatic hypotension: Secondary | ICD-10-CM

## 2012-10-29 MED ORDER — FLUDROCORTISONE ACETATE 0.1 MG PO TABS: ORAL_TABLET | ORAL | Status: DC

## 2012-10-29 MED ORDER — MIDODRINE HCL 10 MG PO TABS: ORAL_TABLET | ORAL | Status: DC

## 2012-10-29 MED ORDER — ONDANSETRON HCL 4 MG PO TABS: 4.0000 mg | ORAL_TABLET | Freq: Every day | ORAL | Status: DC | PRN

## 2012-10-29 NOTE — Assessment & Plan Note (Signed)
The patient has profound and persistent hypotension and interestingly this is occurring without reflex tachycardia. In the past she has had a presumptive diagnosis of dysautonomia; I do not find any association to this with hemochromatosis.  Multiple of her medications can be contributing to this; a recent study raised the issue of antidepressants.the absence of a reflex tachycardia raises the specter of autonomic failure  For now we will anticipate symptomatic therapy using ProAmatine and Florinef as vasoconstrictors and fluid retaining agents, waist high support stockings.  I have discussed with her rheumatologist the potential that Enbrel has been linked to hypotension; it is not within his experienced. In addition, the literature suggests that Zanaflex can contribute as an alpha blocker. I've asked the patient to follow up with her psychiatrist to review the psychotropic medications that she is using and their potential to aggravate her hypotension.  In addition, we will refer her to the autonomic neurologist at [facility].  At this point she is functionally disabled.  We spent about 2 hours involved in her care with discussions and counseling today

## 2012-10-29 NOTE — Progress Notes (Signed)
Patient Care Team: [name], MD as PCP - General (Family Medicine)   HPI  Jennifer [name] is a 47 y.o. female Seen in followup for profound orthostatic and persistent hypotension.  She has a past history consistent with dysautonomia. She also has a history of hemachromatosis and recently diagnosed with arthritis for which she has been treated with a series of agents most recently Enbrel.  She has been hospitalized in the last month or so because of protracted hypotension for which she was treated with vigorous IV fluids  discontinuing Neurontin.  She finds that her weakness has become increasingly disabling over the last week despite the addition of ProAmatine.  She continues to be at her mother's home.  Past Medical History  Diagnosis Date  . Headache(784.0)     migraines  . Orthostatic syncope   . Arthritis     inactive RA- no meds  . IBS (irritable bowel syndrome)   . Dysrhythmia     PVCs  . Depression   . Anxiety   . Hemochromatosis   . Rheumatoid arthritis(714.0) 02/04/2012    Past Surgical History  Procedure Laterality Date  . Patent foramen ovale closure  2008  . Cholecystectomy  1989  . Dilation and curettage of uterus  2004  . Laparoscopy    . Laparoscopic tubal ligation  10/26/2010    Procedure: LAPAROSCOPIC TUBAL LIGATION;  Surgeon: Zelphia Cairo;  Location: [facility];  Service: Gynecology;  Laterality: Bilateral;    Current Outpatient Prescriptions  Medication Sig Dispense Refill  . clonazePAM (KLONOPIN) 1 MG tablet Take 1 tablet (1 mg total) by mouth daily as needed for anxiety. For anxiety  28 tablet  0  . desvenlafaxine (PRISTIQ) 100 MG 24 hr tablet Take 1 tablet (100 mg total) by mouth daily. For depression  30 tablet  0  . dextran 70-hypromellose (TEARS RENEWED) ophthalmic solution Place 2 drops into both eyes as needed (dry eyes).      Marland Kitchen etanercept (ENBREL) 50 MG/ML injection Inject 50 mg into the skin once a week.      Marland Kitchen ibuprofen (ADVIL,MOTRIN)  200 MG tablet Take 4 tablets (800 mg total) by mouth every 8 (eight) hours as needed (for migraine).  30 tablet  0  . midodrine (PROAMATINE) 5 MG tablet Take 10 mg by mouth 2 (two) times daily. Takes 2 in the morning and one at lunch and one at dinner      . ramelteon (ROZEREM) 8 MG tablet Take 8 mg by mouth at bedtime as needed for sleep.      Marland Kitchen tiZANidine (ZANAFLEX) 4 MG capsule Take 4 mg by mouth 3 (three) times daily as needed (migraine).      Marland Kitchen tiZANidine (ZANAFLEX) 4 MG tablet Take 2 tablets (8 mg total) by mouth at bedtime.  60 tablet  0  . topiramate (TOPAMAX) 100 MG tablet Take 3 tablets (300 mg total) by mouth daily. For mood stabilization  30 tablet  0  . ondansetron (ZOFRAN) 4 MG tablet Take 1 tablet (4 mg total) by mouth daily as needed for nausea.  20 tablet  0   No current facility-administered medications for this visit.    Allergies  Allergen Reactions  . Leflunomide Diarrhea  . Methotrexate Derivatives Hives  . Reglan [Metoclopramide]     Jerky   . Remicade [Infliximab] Hives, Diarrhea and Other (See Comments)    Increase heart rate,decrease blood pressure  . Morphine And Related Rash    Rxn to IV  morphine; has never had PO morphine  . Sulfonamide Derivatives Rash    Review of Systems negative except from HPI and PMH  Physical Exam BP 72/49  Pulse 64  Ht 5\' 7"  (1.702 m)  Wt 134 lb 6.4 oz (60.963 kg)  BMI 21.04 kg/m2  Notably no change in heart rate with orthostatic stress Well developed and nourished in no acute distress HENT normal resolving ecchymosis Neck supple with JVP-flat Clear Regular rate and rhythm, no murmurs or gallops Abd-soft with active BS No Clubbing cyanosis edema Skin-warm and dry A & Oriented  Grossly normal sensory and motor function   sinus rhythm at 53 Intervals 13/07/44  Assessment and  Plan

## 2012-10-29 NOTE — Patient Instructions (Addendum)
Your physician has recommended you make the following change in your medication:  1) Start Florinef 0.2mg  twice daily 2) Start Proamatine 10mg  three times daily  Your physician recommends that you return for lab work in: 2 weeks on 11/12/12

## 2012-10-30 ENCOUNTER — Other Ambulatory Visit: Payer: Self-pay | Admitting: Internal Medicine

## 2012-10-30 ENCOUNTER — Telehealth: Payer: Self-pay | Admitting: Internal Medicine

## 2012-10-30 MED ORDER — ONDANSETRON HCL 4 MG PO TABS: 4.0000 mg | ORAL_TABLET | Freq: Every day | ORAL | Status: DC | PRN

## 2012-10-30 NOTE — Telephone Encounter (Signed)
New problem   Pt stated her prescription for Zofran wasn't at the pharmacy as noted. Please call pt

## 2012-10-30 NOTE — Telephone Encounter (Signed)
Spoke with pt, aware script sent to pharm 

## 2012-10-31 ENCOUNTER — Telehealth: Payer: Self-pay | Admitting: Internal Medicine

## 2012-10-31 NOTE — Telephone Encounter (Signed)
New problem  Pt called in about Medication (fludrocortisone) that gave her a servere headaches.

## 2012-10-31 NOTE — Telephone Encounter (Signed)
Walk In Patient Form: [employer] Attending Physician Statement for completion/ spoke to [facility] with [employer] ext [phone] [employer] does not complete these forms Given to [facility] F/ Dr. [name] in [place]/djc

## 2012-11-04 ENCOUNTER — Other Ambulatory Visit: Payer: Self-pay | Admitting: *Deleted

## 2012-11-04 ENCOUNTER — Telehealth: Payer: Self-pay | Admitting: Internal Medicine

## 2012-11-04 DIAGNOSIS — G901 Familial dysautonomia [Riley-Day]: Secondary | ICD-10-CM

## 2012-11-04 NOTE — Telephone Encounter (Signed)
Pt called to let us know that she had allergic reaction to new medication Florinef, she states that she couldn't move or talk for a short period of time after taking medication. She states that this is also to be used cautiously with Enbrel, and that she thinks this is a medication interaction after speaking with her pharmacist. She has not taken Florinef since Thursday and is questioning whether or not to take salt tablets to supplement. I explained to her that dr. [name] is out of the office until next week and she is agreeable to waiting until he returns for answer to this.    She also questioned about her referral to [facility] - I explained that we started referral process today.

## 2012-11-04 NOTE — Telephone Encounter (Signed)
Pt rtn call to sherri °

## 2012-11-05 ENCOUNTER — Telehealth: Payer: Self-pay | Admitting: Internal Medicine

## 2012-11-05 NOTE — Telephone Encounter (Signed)
Pt aware [employer] papers signed & Completed, Mailed to  [address] Jennifer [name] [id number] Per Pt Request to have these papers Mailed to Her parents address above 11/05/12/KM

## 2012-11-10 ENCOUNTER — Other Ambulatory Visit: Payer: Self-pay | Admitting: Lab

## 2012-11-11 NOTE — Telephone Encounter (Signed)
Spoke with patient who says that she is doing well. States that her blood pressure is better on the Midodrine, but that when it is close to time to take another one, her BP drops to 60 or 70 systolic. But overall she is feeling ok. Per Dr. [name] it is ok to stop Florinef and start Thermatabs. Pt is agreeable to plan

## 2012-11-12 ENCOUNTER — Other Ambulatory Visit: Payer: Self-pay

## 2012-11-14 ENCOUNTER — Telehealth: Payer: Self-pay | Admitting: Internal Medicine

## 2012-11-14 NOTE — Telephone Encounter (Signed)
Bp is running very low. Have not received the salt tabs yet.

## 2012-11-14 NOTE — Telephone Encounter (Signed)
Patient complaining that midodrine is not sustaining her taking it every 8 hours per doctor [name] instruction. She states that her BP will be in the 90 to low 100s for first few hours after taking it and then it drops back into the 60s to 70s systolic.  Four hours after she took it last night her BP was 61/43 sitting, and that she passed out twice last night, with her mother there to catch her.  Per our conversation I will discuss this with [name] on Monday if I can reach him, and patient understands that he is out of office until Tuesday. Patient wants to wait to talk to [name], she does not want another doctor to help at this time She is asking if we should change the schedule of her midodrine if doctor agrees She states her salt tablet will be in today and she will see how she does over the weekend. I told her I would check on her Monday to see how she was doing Patient agreeable to plan

## 2012-11-17 NOTE — Telephone Encounter (Signed)
Follow up with patient to see how weekend went - she states no change except she has been nauseous since starting salt tabs, waking up at night, and drinking plenty of water. BPs unchanged for the most part. I will pass along information to Dr. [name]

## 2012-11-18 ENCOUNTER — Encounter: Payer: Self-pay | Admitting: Internal Medicine

## 2012-11-19 ENCOUNTER — Telehealth: Payer: Self-pay | Admitting: *Deleted

## 2012-11-19 NOTE — Telephone Encounter (Signed)
Patient requesting prescription for tizanidine - Jennifer [name] advising pt to follow up with PCP about this. He found this medication to have hypotensive side effects for 2/3 of patients and I explained this to her. We recommend considering alternative therapy. Pt agreeable to plan

## 2012-11-19 NOTE — Telephone Encounter (Signed)
error 

## 2012-11-19 NOTE — Telephone Encounter (Signed)
Checked in patient, states she is still about the same. Still experincing nausea, and taking buffered salt tablets.  She also asked about getting script for tizanidine. I will discuss with [name] about nausea and script and get back with her. Pt is agreeable to plan

## 2012-11-25 ENCOUNTER — Telehealth: Payer: Self-pay | Admitting: Internal Medicine

## 2012-11-25 NOTE — Telephone Encounter (Signed)
Patient called with concerns regarding blood pressure and heart rate. Patient states she last took Midodrine at 1500 yesterday.  States HR was 145 sitting and 161 standing and patient had difficult standing x 1 min, felt nauseous, and had leg pain at the time.  Patient states she has not taken Midodrine since that time due to symptoms.  BP currently 84/52, HR 112.  Patient is concerned that she cannot sustain an elevated heart rate and would like advice from Dr. [name] regarding medication therapy.  I advised patient that I will talk to Dr. [name] who is in the office today and call her back.  Patient verbalized agreement to plan.

## 2012-11-25 NOTE — Telephone Encounter (Signed)
Pt states she believes her meds are increasing her heart rate. Pt states her standing heart rate is 161 and her sitting heart rate is 145 as of last night. Pt states her heart rate 112 and BP is 87/ 74 as of now, 10:10 am 11/25/12.... Please advise

## 2012-11-25 NOTE — Telephone Encounter (Signed)
I reviewed patient's complaint with Dr. [name] and Dory Horn, RN, Dr. Odessa Fleming primary nurse.  Dr. [name] advised Sherri to call Dr. Purnell Shoemaker, neurologist at [facility] to discuss patient with him; a referral was sent to them last week.  I was advised by Roanna Raider to have patient present to emergency department at [facility] per Dr. Harlene Salts advice in order for patient to be seen by him.  I advised patient and she verbalized understanding and agreement with plan of care.

## 2012-11-26 ENCOUNTER — Telehealth: Payer: Self-pay | Admitting: Internal Medicine

## 2012-11-26 NOTE — Telephone Encounter (Signed)
New problem      The office on yesterday suggest patient to go to [facility]  ED  on yesterday.  Patient stated They only gave patient a bag of fluids. Hr down 140. Felt she was stable enough to be sent home.  The MD was not there .   Patient advise she not take midodrine & topriamate. Please advise.

## 2012-11-26 NOTE — Telephone Encounter (Signed)
Patient tells me she hasn't been taking her Midodrine for awhile now secondary to increased HR since starting it. She states her HR today was about 130s standing and BP in 70s. She hasn't been taking her salt tablets either. She knows Dr. [name] will not be in until next week, but she would like me to see if doctor has advice until his return, she also understands she may have to wait until he is back to get more advice on next steps.  I called Dr Purnell Shoemaker office about this issue, spoke with his assistant, who is looking  Into getting patient earlier appt than 01/06/13. Patient agreeable to plan.

## 2012-12-04 ENCOUNTER — Encounter: Payer: Self-pay | Admitting: Internal Medicine

## 2012-12-04 ENCOUNTER — Ambulatory Visit (INDEPENDENT_AMBULATORY_CARE_PROVIDER_SITE_OTHER): Payer: 59 | Admitting: Internal Medicine

## 2012-12-04 VITALS — BP 99/74 | HR 152 | Ht 67.0 in | Wt 132.0 lb

## 2012-12-04 DIAGNOSIS — I951 Orthostatic hypotension: Secondary | ICD-10-CM

## 2012-12-04 DIAGNOSIS — R000 Tachycardia, unspecified: Secondary | ICD-10-CM

## 2012-12-04 MED ORDER — ONDANSETRON HCL 4 MG PO TABS: 4.0000 mg | ORAL_TABLET | Freq: Every day | ORAL | Status: DC | PRN

## 2012-12-04 MED ORDER — PROPRANOLOL HCL ER 60 MG PO CP24: 60.0000 mg | ORAL_CAPSULE | Freq: Every day | ORAL | Status: DC

## 2012-12-04 NOTE — Assessment & Plan Note (Signed)
Her blood pressure for some better and she is manifesting more evidence of postural tachycardia with the change in heart rate from 100--150. I like to start her on low-dose propranolol LA 60 mg. We have discussed the potential adverse impact of and lowering her blood pressure. In the event that we find more for blood pressure I will resume her ProAmatine  She is scheduled to see Dr. Purnell Shoemaker at [facility] in early November.  We spent 40 min counseling on the above

## 2012-12-04 NOTE — Progress Notes (Signed)
Patient Care Team: [name], MD as PCP - General (Family Medicine)   HPI  Jennifer [name] is a 47 y.o. female Seen in followup for profound orthostatic and persistent hypotension.  She has a past history consistent with dysautonomia. She also has a history of hemachromatosis and recently diagnosed with arthritis for which she has been treated with a series of agents most recently Enbrel.    She is a struggling with her level of hypotension. We had referred her to [facility] autonomic clinic; because of on going issues we spoke today in which she went to the [facility] ER Where she was hydrated and discharged.   She discontinued her ProAmatine as she noted that her heart rate was in the 150 range. This is also identified at [facility]. Her blood pressures at home have been in the 100 range mostly there have been some in the 70s again. She has had 2 episodes of near syncope both of which occurred following micturition.  She continues to be at her mother's home.  Past Medical History  Diagnosis Date  . Headache(784.0)     migraines  . Orthostatic syncope   . Arthritis     inactive RA- no meds  . IBS (irritable bowel syndrome)   . Dysrhythmia     PVCs  . Depression   . Anxiety   . Hemochromatosis   . Rheumatoid arthritis(714.0) 02/04/2012    Past Surgical History  Procedure Laterality Date  . Patent foramen ovale closure  2008  . Cholecystectomy  1989  . Dilation and curettage of uterus  2004  . Laparoscopy    . Laparoscopic tubal ligation  10/26/2010    Procedure: LAPAROSCOPIC TUBAL LIGATION;  Surgeon: Zelphia Cairo;  Location: [facility] ORS;  Service: Gynecology;  Laterality: Bilateral;    Current Outpatient Prescriptions  Medication Sig Dispense Refill  . clonazePAM (KLONOPIN) 1 MG tablet Take 1 tablet (1 mg total) by mouth daily as needed for anxiety. For anxiety  28 tablet  0  . desvenlafaxine (PRISTIQ) 100 MG 24 hr tablet Take 1 tablet (100 mg total) by mouth daily. For depression  30  tablet  0  . dextran 70-hypromellose (TEARS RENEWED) ophthalmic solution Place 2 drops into both eyes as needed (dry eyes).      Marland Kitchen etanercept (ENBREL) 50 MG/ML injection Inject 50 mg into the skin once a week.      Marland Kitchen ibuprofen (ADVIL,MOTRIN) 200 MG tablet Take 4 tablets (800 mg total) by mouth every 8 (eight) hours as needed (for migraine).  30 tablet  0  . ondansetron (ZOFRAN) 4 MG tablet Take 1 tablet (4 mg total) by mouth daily as needed for nausea.  20 tablet  0  . ramelteon (ROZEREM) 8 MG tablet Take 8 mg by mouth at bedtime as needed for sleep.      Marland Kitchen topiramate (TOPAMAX) 100 MG tablet Take 3 tablets (300 mg total) by mouth daily. For mood stabilization  30 tablet  0   No current facility-administered medications for this visit.    Allergies  Allergen Reactions  . Leflunomide Diarrhea  . Methotrexate Derivatives Hives  . Reglan [Metoclopramide]     Jerky   . Remicade [Infliximab] Hives, Diarrhea and Other (See Comments)    Increase heart rate,decrease blood pressure  . Morphine And Related Rash    Rxn to IV morphine; has never had PO morphine  . Sulfonamide Derivatives Rash    Review of Systems negative except from HPI and PMH  Physical Exam BP 99/74  Pulse 152  Ht 5\' 7"  (1.702 m)  Wt 132 lb (59.875 kg)  BMI 20.67 kg/m2   Well developed and nourished in no acute distress HENT normal resolving ecchymosis Neck supple with JVP-flat Clear Regular rate and rhythm, no murmurs or gallops Abd-soft with active BS No Clubbing cyanosis edema Skin-warm and dry A & Oriented  Grossly normal sensory and motor function     Assessment and  Plan

## 2012-12-04 NOTE — Patient Instructions (Addendum)
Your physician has recommended you make the following change in your medication:  1) start Propranolol 60 mg daily  Your physician recommends that you schedule a follow-up appointment in: November with Dr. [name]

## 2012-12-08 NOTE — Telephone Encounter (Signed)
New Problem  The propranolol is lowing BP to the low 70-80's. Experiencing side effects.. nausea fatigue, weakness and diarrhea. Pt is requesting a lower dosage.. Please call back to discuss.

## 2012-12-09 ENCOUNTER — Other Ambulatory Visit: Payer: Self-pay | Admitting: *Deleted

## 2012-12-09 NOTE — Telephone Encounter (Signed)
Advised to discontinue Propanolol per Dr. [name].  Patient agreeable to plan.

## 2012-12-26 ENCOUNTER — Telehealth: Payer: Self-pay | Admitting: Internal Medicine

## 2012-12-26 NOTE — Telephone Encounter (Signed)
New Problem:  Pt states Sherri sent short term diability paper work to her employer. Pt states her return to work date is suppose to be 11/19. Pt states her employer told her the date to return was 11/4. Pt would like to know what date is on her paperwork and if it can be changed to 11/19. Please advise

## 2012-12-26 NOTE — Telephone Encounter (Signed)
Patient states that the HR Dept from her employer is still needing office notes regarding her disability status. They do have the correct RTW date of 11/19, but just need copy of office notes. Will route to Dory Horn, RN, and Selena Batten, [place], Medical Records, to follow up.  Patient would like a call back by Tuesday, October 28th, to verify status.

## 2012-12-30 NOTE — Telephone Encounter (Signed)
Explained that I would fax over office note today to [employer] disability. Patient agreeable to plan.

## 2013-01-13 ENCOUNTER — Encounter: Payer: Self-pay | Admitting: Internal Medicine

## 2013-01-19 ENCOUNTER — Telehealth: Payer: Self-pay | Admitting: Oncology

## 2013-01-19 ENCOUNTER — Other Ambulatory Visit: Payer: 59 | Admitting: Lab

## 2013-01-19 NOTE — Telephone Encounter (Signed)
Pt called and r/s lab  to 11/19th fro, 11/17th

## 2013-01-20 ENCOUNTER — Ambulatory Visit (INDEPENDENT_AMBULATORY_CARE_PROVIDER_SITE_OTHER): Payer: 59 | Admitting: Internal Medicine

## 2013-01-20 ENCOUNTER — Encounter: Payer: Self-pay | Admitting: Internal Medicine

## 2013-01-20 VITALS — BP 89/60 | HR 97 | Ht 67.0 in | Wt 132.8 lb

## 2013-01-20 DIAGNOSIS — Q078 Other specified congenital malformations of nervous system: Secondary | ICD-10-CM

## 2013-01-20 DIAGNOSIS — F339 Major depressive disorder, recurrent, unspecified: Secondary | ICD-10-CM

## 2013-01-20 DIAGNOSIS — I951 Orthostatic hypotension: Secondary | ICD-10-CM

## 2013-01-20 DIAGNOSIS — R000 Tachycardia, unspecified: Secondary | ICD-10-CM

## 2013-01-20 NOTE — Assessment & Plan Note (Signed)
I've also encouraged her to pursue counseling in con junction with her antidepressant therapy

## 2013-01-20 NOTE — Progress Notes (Signed)
Little dizzy

## 2013-01-20 NOTE — Progress Notes (Signed)
Legs hurting, whole body feels heavy, chest hurting, some nausea and dizziness.

## 2013-01-20 NOTE — Patient Instructions (Signed)
Your physician recommends that you continue on your current medications as directed. Please refer to the Current Medication list given to you today.  Dr. [name] will speak with a few doctors and get back with you.

## 2013-01-20 NOTE — Assessment & Plan Note (Addendum)
The diagnosis of POTS was confirmed at [facility] the patient felt as if she were told that there is nothing to do. She did get the abdominal binder that was recommended and recumbent bike  I will discuss her situation with Dr. Bascom Levels at [facility] and Dr. Berneice Gandy in [place]. She has not tolerated multiple medications. I am a little bit reluctant to try clonidine given her relative hypotension. She did feel somewhat better with IV hydration. There is a some use of erythropoietin and somatostatin  We spent >45 min

## 2013-01-20 NOTE — Progress Notes (Signed)
Patient Care Team: [name], MD as PCP - General (Family Medicine)   HPI  Jennifer [name] is a 47 y.o. female Seen in followup for profound orthostatic and persistent hypotension.  She has a past history consistent with dysautonomia. She also has a history of hemachromatosis and recently diagnosed with arthritis for which she has been treated with a series of agents most recently Enbrel  She was seen at [facility] with a diagnosis of POTS was clarified. She did not have evidence of orthostatic hypotension at that time she has had previously.  She has not tolerated Florinef or ProAmatine in the past.  She continues to work on salt and fluid intake. She struggled with depression with increasing social isolation and debility     She continues to be at her mother's home.   Past Medical History  Diagnosis Date  . Headache(784.0)     migraines  . Orthostatic syncope   . Arthritis     inactive RA- no meds  . IBS (irritable bowel syndrome)   . Dysrhythmia     PVCs  . Depression   . Anxiety   . Hemochromatosis   . Rheumatoid arthritis(714.0) 02/04/2012    Past Surgical History  Procedure Laterality Date  . Patent foramen ovale closure  2008  . Cholecystectomy  1989  . Dilation and curettage of uterus  2004  . Laparoscopy    . Laparoscopic tubal ligation  10/26/2010    Procedure: LAPAROSCOPIC TUBAL LIGATION;  Surgeon: Zelphia Cairo;  Location: [facility];  Service: Gynecology;  Laterality: Bilateral;    Current Outpatient Prescriptions  Medication Sig Dispense Refill  . clidinium-chlordiazePOXIDE (LIBRAX) 5-2.5 MG per capsule Take 1 capsule by mouth as needed.      . clonazePAM (KLONOPIN) 1 MG tablet Take 1 tablet (1 mg total) by mouth daily as needed for anxiety. For anxiety  28 tablet  0  . desvenlafaxine (PRISTIQ) 100 MG 24 hr tablet Take 1 tablet (100 mg total) by mouth daily. For depression  30 tablet  0  . dextran 70-hypromellose (TEARS RENEWED) ophthalmic  solution Place 2 drops into both eyes as needed (dry eyes).      Marland Kitchen etanercept (ENBREL) 50 MG/ML injection Inject 50 mg into the skin once a week.      Marland Kitchen ibuprofen (ADVIL,MOTRIN) 200 MG tablet Take 4 tablets (800 mg total) by mouth every 8 (eight) hours as needed (for migraine).  30 tablet  0  . ondansetron (ZOFRAN-ODT) 8 MG disintegrating tablet as needed.      . ramelteon (ROZEREM) 8 MG tablet Take 8 mg by mouth at bedtime as needed for sleep.      Marland Kitchen topiramate (TOPAMAX) 100 MG tablet Take 3 tablets (300 mg total) by mouth daily. For mood stabilization  30 tablet  0   No current facility-administered medications for this visit.    Allergies  Allergen Reactions  . Leflunomide Diarrhea  . Methotrexate Derivatives Hives  . Reglan [Metoclopramide]     Jerky   . Remicade [Infliximab] Hives, Diarrhea and Other (See Comments)    Increase heart rate,decrease blood pressure  . Morphine And Related Rash    Rxn to IV morphine; has never had PO morphine  . Sulfonamide Derivatives Rash    Review of Systems negative except from HPI and PMH  Physical Exam BP 89/60  Pulse 97  Ht 5\' 7"  (1.702 m)  Wt 132 lb 12.8 oz (60.238 kg)  BMI 20.79 kg/m2  Frontal wasting  Flat affect cachectic    Assessment and  Plan

## 2013-01-20 NOTE — Progress Notes (Signed)
Legs heavy and chest hurts.

## 2013-01-21 ENCOUNTER — Telehealth: Payer: Self-pay | Admitting: Internal Medicine

## 2013-01-21 ENCOUNTER — Other Ambulatory Visit (HOSPITAL_BASED_OUTPATIENT_CLINIC_OR_DEPARTMENT_OTHER): Payer: 59 | Admitting: Lab

## 2013-01-21 DIAGNOSIS — Z862 Personal history of diseases of the blood and blood-forming organs and certain disorders involving the immune mechanism: Secondary | ICD-10-CM

## 2013-01-21 LAB — CBC WITH DIFFERENTIAL/PLATELET
BASO%: 0.9 % (ref 0.0–2.0)
Basophils Absolute: 0 10*3/uL (ref 0.0–0.1)
EOS%: 2.5 % (ref 0.0–7.0)
Eosinophils Absolute: 0.1 10*3/uL (ref 0.0–0.5)
HGB: 12.2 g/dL (ref 11.6–15.9)
LYMPH%: 63.7 % — ABNORMAL HIGH (ref 14.0–49.7)
MCH: 33 pg (ref 25.1–34.0)
MCHC: 33.4 g/dL (ref 31.5–36.0)
NEUT#: 0.9 10*3/uL — ABNORMAL LOW (ref 1.5–6.5)
Platelets: 183 10*3/uL (ref 145–400)
RBC: 3.7 10*6/uL (ref 3.70–5.45)
RDW: 13.2 % (ref 11.2–14.5)
lymph#: 2.7 10*3/uL (ref 0.9–3.3)

## 2013-01-21 LAB — COMPREHENSIVE METABOLIC PANEL (CC13)
ALT: 28 U/L (ref 0–55)
AST: 32 U/L (ref 5–34)
Albumin: 3.3 g/dL — ABNORMAL LOW (ref 3.5–5.0)
Alkaline Phosphatase: 67 U/L (ref 40–150)
Glucose: 56 mg/dl — ABNORMAL LOW (ref 70–140)
Potassium: 3.8 mEq/L (ref 3.5–5.1)
Sodium: 142 mEq/L (ref 136–145)
Total Protein: 6.2 g/dL — ABNORMAL LOW (ref 6.4–8.3)

## 2013-01-21 LAB — FERRITIN CHCC: Ferritin: 55 ng/ml (ref 9–269)

## 2013-01-21 NOTE — Telephone Encounter (Signed)
New problem    Pt needs to know what did you put for short term disability date.  Please give her a call back.

## 2013-01-26 ENCOUNTER — Ambulatory Visit: Payer: 59 | Admitting: Oncology

## 2013-01-26 ENCOUNTER — Telehealth: Payer: Self-pay | Admitting: *Deleted

## 2013-01-26 NOTE — Telephone Encounter (Signed)
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  She asked that Dr. Cyndie Chime call her with blood results & would like to r/s.  Note to Dr Cyndie Chime.

## 2013-01-27 NOTE — Telephone Encounter (Signed)
Let patient know that I faxed her short term disability extension paperwork and that Dr. [name] is not clear yet as to anticipated date for return to work full duty. Patient wanted to make sure that Dr. [name] is going to talk with doctors they discussed. I explained that I would pass this along to him and we would be in touch with findings and plan of care. Patient is agreeable to plan.

## 2013-01-28 ENCOUNTER — Telehealth: Payer: Self-pay | Admitting: *Deleted

## 2013-01-28 ENCOUNTER — Other Ambulatory Visit: Payer: Self-pay | Admitting: *Deleted

## 2013-01-28 DIAGNOSIS — Z862 Personal history of diseases of the blood and blood-forming organs and certain disorders involving the immune mechanism: Secondary | ICD-10-CM

## 2013-01-28 NOTE — Telephone Encounter (Signed)
Notified pt for ferritin result & no need for phleb.  She states she is not getting methotrexate b/c she had a reaction to it & is on weekly Enbrel.   POF to schedulers to r/s lab & MD visit .

## 2013-01-28 NOTE — Telephone Encounter (Signed)
Message copied by Sabino Snipes on Wed Jan 28, 2013  2:07 PM ------      Message from: Levert Feinstein      Created: Mon Jan 26, 2013  8:47 PM       Call ferritin remians low/stable - no need for phlebotomy.  Is she still getting methotrexate for arthritis? She needs to R/S routine visit here after the holidays to check CBC ------

## 2013-01-30 ENCOUNTER — Other Ambulatory Visit: Payer: Self-pay | Admitting: Oncology

## 2013-01-30 ENCOUNTER — Telehealth: Payer: Self-pay | Admitting: Oncology

## 2013-01-30 DIAGNOSIS — Z862 Personal history of diseases of the blood and blood-forming organs and certain disorders involving the immune mechanism: Secondary | ICD-10-CM

## 2013-01-30 NOTE — Telephone Encounter (Signed)
Talked to pt and gave her appt for January 2015 lab before md on

## 2013-01-30 NOTE — Telephone Encounter (Signed)
s.w. pt and advised on Jan 2015 appt...pt ok and aware °

## 2013-02-04 ENCOUNTER — Telehealth: Payer: Self-pay | Admitting: Internal Medicine

## 2013-02-04 NOTE — Telephone Encounter (Signed)
Spoke with pt and told her Roanna Raider will be back in office tomorrow and can call her at that time. Pt agreeable with this plan.

## 2013-02-04 NOTE — Telephone Encounter (Signed)
New message     Need to ask Jennifer [name] about her disability status

## 2013-02-05 NOTE — Telephone Encounter (Signed)
Explained to patient that at this time Dr. [name] is putting her out of work indefinitely, allowing more time to follow this issue and to try and improve patient symptoms. Patient agreeable to plan.

## 2013-02-05 NOTE — Telephone Encounter (Signed)
Patient inquiring as to plan of care. Asking if Dr. [name] is going to put her out of work longer and if he has talked with the other physicians. She states that her work is putting her on unpaid leave 12/17, and needs to know what plan is. I explained I would discuss with Dr. [name] and get back with her, she is agreeable to plan.

## 2013-02-17 ENCOUNTER — Telehealth: Payer: Self-pay | Admitting: Internal Medicine

## 2013-02-17 NOTE — Telephone Encounter (Signed)
New Problem:  Pt is calling to check on the referrals Dr. [name] was working on for her. Also, Pt is calling about gabopentin Rx. P is wondering if she can go back on a low dose of it. Pt states she was taking it and it lowered her BP too much. PT is requesting a call back.

## 2013-02-19 ENCOUNTER — Telehealth: Payer: Self-pay | Admitting: Internal Medicine

## 2013-02-19 NOTE — Telephone Encounter (Signed)
Per Dr. [name], pt ok to try low dose Gabapentin. He is still working on referrals, she understands this.

## 2013-02-19 NOTE — Telephone Encounter (Signed)
New message  Patient wants to know if she can start back taking her gabapentin. Please call patient and advise.

## 2013-02-19 NOTE — Telephone Encounter (Signed)
A user error has taken place: encounter opened in error, closed for administrative reasons. See 12/16 telephone call

## 2013-03-06 ENCOUNTER — Other Ambulatory Visit: Payer: Self-pay

## 2013-03-06 ENCOUNTER — Telehealth: Payer: Self-pay | Admitting: Oncology

## 2013-03-06 NOTE — Telephone Encounter (Signed)
, °

## 2013-03-09 ENCOUNTER — Encounter (INDEPENDENT_AMBULATORY_CARE_PROVIDER_SITE_OTHER): Payer: Self-pay

## 2013-03-09 ENCOUNTER — Other Ambulatory Visit (HOSPITAL_BASED_OUTPATIENT_CLINIC_OR_DEPARTMENT_OTHER): Payer: 59

## 2013-03-09 ENCOUNTER — Ambulatory Visit: Payer: Self-pay | Admitting: Oncology

## 2013-03-09 DIAGNOSIS — Z862 Personal history of diseases of the blood and blood-forming organs and certain disorders involving the immune mechanism: Secondary | ICD-10-CM

## 2013-03-09 LAB — CBC WITH DIFFERENTIAL/PLATELET
BASO%: 1.2 % (ref 0.0–2.0)
Basophils Absolute: 0 10*3/uL (ref 0.0–0.1)
EOS%: 2.7 % (ref 0.0–7.0)
Eosinophils Absolute: 0.1 10*3/uL (ref 0.0–0.5)
HCT: 36.3 % (ref 34.8–46.6)
HGB: 12 g/dL (ref 11.6–15.9)
LYMPH#: 2.3 10*3/uL (ref 0.9–3.3)
LYMPH%: 56.8 % — ABNORMAL HIGH (ref 14.0–49.7)
MCH: 33 pg (ref 25.1–34.0)
MCHC: 33.2 g/dL (ref 31.5–36.0)
MCV: 99.5 fL (ref 79.5–101.0)
MONO#: 0.4 10*3/uL (ref 0.1–0.9)
MONO%: 9.5 % (ref 0.0–14.0)
NEUT#: 1.2 10*3/uL — ABNORMAL LOW (ref 1.5–6.5)
NEUT%: 29.8 % — ABNORMAL LOW (ref 38.4–76.8)
Platelets: 162 10*3/uL (ref 145–400)
RBC: 3.65 10*6/uL — ABNORMAL LOW (ref 3.70–5.45)
RDW: 12.2 % (ref 11.2–14.5)
WBC: 4 10*3/uL (ref 3.9–10.3)

## 2013-03-09 LAB — IRON AND TIBC CHCC
%SAT: 83 % — ABNORMAL HIGH (ref 21–57)
IRON: 165 ug/dL — AB (ref 41–142)
TIBC: 200 ug/dL — AB (ref 236–444)
UIBC: 35 ug/dL — ABNORMAL LOW (ref 120–384)

## 2013-03-09 LAB — FERRITIN CHCC: Ferritin: 34 ng/ml (ref 9–269)

## 2013-03-10 ENCOUNTER — Ambulatory Visit (HOSPITAL_BASED_OUTPATIENT_CLINIC_OR_DEPARTMENT_OTHER): Payer: 59 | Admitting: Oncology

## 2013-03-10 VITALS — BP 90/55 | HR 83 | Temp 97.4°F | Resp 18 | Ht 67.0 in | Wt 135.9 lb

## 2013-03-10 DIAGNOSIS — I498 Other specified cardiac arrhythmias: Secondary | ICD-10-CM

## 2013-03-10 DIAGNOSIS — I951 Orthostatic hypotension: Secondary | ICD-10-CM

## 2013-03-10 DIAGNOSIS — Z862 Personal history of diseases of the blood and blood-forming organs and certain disorders involving the immune mechanism: Secondary | ICD-10-CM

## 2013-03-10 DIAGNOSIS — M069 Rheumatoid arthritis, unspecified: Secondary | ICD-10-CM

## 2013-03-10 MED ORDER — ONDANSETRON 8 MG PO TBDP: 8.0000 mg | ORAL_TABLET | ORAL | Status: DC | PRN

## 2013-03-11 ENCOUNTER — Telehealth: Payer: Self-pay | Admitting: Internal Medicine

## 2013-03-11 NOTE — Telephone Encounter (Signed)
New Problem:  Pt states she would like to check on the status of the referrals Dr. [name] was working on. She states Sherri will know what she is talking about. Pt would like a call back as soon as possible.

## 2013-03-11 NOTE — Progress Notes (Signed)
Hematology and Oncology Follow Up Visit  Jennifer [name] [id number] [birth year] 47 y.o. 03/11/2013 5:02 PM   Principle Diagnosis: Encounter Diagnosis  Name Primary?  . 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 Multiple family members are affected. Jennifer [name] has a low normal ferritin level and has not required phlebotomy. She has rheumatoid arthritis and is currently receiving Enbrel injections. She has developed a postural hypotension syndrome and has seen a number of specialists. She has been tried on Florinef, and midodrine with either intolerance or no improvement. She has had syncopal episodes and fallen down a number of occasions. Locally she has been followed by Dr. Berton Mount. He planned to put her in touch with a specialist in [place]. She was told that she is unstable to go back to work. She is understandably depressed about this.  Medications: reviewed  Allergies:  Allergies  Allergen Reactions  . Leflunomide Diarrhea  . Methotrexate Derivatives Hives  . Reglan [Metoclopramide]     Jerky   . Remicade [Infliximab] Hives, Diarrhea and Other (See Comments)    Increase heart rate,decrease blood pressure  . Morphine And Related Rash    Rxn to IV morphine; has never had PO morphine  . Sulfonamide Derivatives Rash    Review of Systems: Hematology:  No bleeding or bruising ENT ROS: No sore throat Breast ROS:  Respiratory ROS: No cough or dyspnea Cardiovascular ROS:  See above Gastrointestinal ROS:  No abdominal pain or change in bowel habit Genito-Urinary ROS: Not questioned Musculoskeletal ROS: See above Neurological ROS: See above Dermatological ROS: No rash Remaining ROS negative.  Physical Exam: Blood pressure 90/55, pulse 83, temperature 97.4 F (36.3 C), temperature source Oral, resp. rate 18, height 5\' 7"  (1.702 m), weight 135  lb 14.4 oz (61.644 kg). Wt Readings from Last 3 Encounters:  03/10/13 135 lb 14.4 oz (61.644 kg)  01/20/13 132 lb 12.8 oz (60.238 kg)  12/04/12 132 lb (59.875 kg)     General appearance: Well-nourished Caucasian woman HENNT: Pharynx no erythema, exudate, mass, or ulcer. No thyromegaly or thyroid nodules Lymph nodes: No cervical, supraclavicular, or axillary lymphadenopathy Breasts:  Lungs: Clear to auscultation, resonant to percussion throughout Heart: Regular rhythm, no murmur, no gallop, no rub, no click, no edema Abdomen: Soft, nontender, normal bowel sounds, no mass, no organomegaly Extremities: No edema, no calf tenderness Musculoskeletal: no joint deformities GU:  Vascular: Carotid pulses 2+, no bruits,  Neurologic: Alert, oriented, PERRLA,  cranial nerves grossly normal, motor strength 5 over 5, reflexes 1+ symmetric, upper body coordination normal, gait normal, Skin: No rash or ecchymosis  Lab Results: CBC W/Diff    Component Value Date/Time   WBC 4.0 03/09/2013 1128   WBC 8.9 10/18/2012 0545   RBC 3.65* 03/09/2013 1128   RBC 3.50* 10/18/2012 0545   HGB 12.0 03/09/2013 1128   HGB 11.9* 10/18/2012 0545   HCT 36.3 03/09/2013 1128   HCT 33.7* 10/18/2012 0545   PLT 162 03/09/2013 1128   PLT 152 10/18/2012 0545   MCV 99.5 03/09/2013 1128   MCV 96.3 10/18/2012 0545   MCH 33.0 03/09/2013 1128   MCH 34.0 10/18/2012 0545   MCHC 33.2 03/09/2013 1128   MCHC 35.3 10/18/2012 0545   RDW 12.2 03/09/2013 1128   RDW 11.6 10/18/2012 0545   LYMPHSABS 2.3 03/09/2013 1128   LYMPHSABS 2.7 10/17/2012 1621  MONOABS 0.4 03/09/2013 1128   MONOABS 0.5 10/17/2012 1621   EOSABS 0.1 03/09/2013 1128   EOSABS 0.1 10/17/2012 1621   BASOSABS 0.0 03/09/2013 1128   BASOSABS 0.0 10/17/2012 1621     Chemistry      Component Value Date/Time   NA 142 01/21/2013 1315   NA 139 10/18/2012 0545   K 3.8 01/21/2013 1315   K 3.7 10/18/2012 0545   CL 111 10/18/2012 0545   CL 111* 01/30/2012 1356   CO2 22 01/21/2013 1315   CO2 21  10/18/2012 0545   BUN 6.9* 01/21/2013 1315   BUN 5* 10/18/2012 0545   CREATININE 0.8 01/21/2013 1315   CREATININE 0.75 10/18/2012 0545      Component Value Date/Time   CALCIUM 8.7 01/21/2013 1315   CALCIUM 8.2* 10/18/2012 0545   ALKPHOS 67 01/21/2013 1315   ALKPHOS 52 07/23/2012 0625   AST 32 01/21/2013 1315   AST 20 07/23/2012 0625   ALT 28 01/21/2013 1315   ALT 23 07/23/2012 0625   BILITOT 0.30 01/21/2013 1315   BILITOT 0.5 07/23/2012 0625    Ferritin 36  .  Impression:  #1. 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 Normal liver functions. Plan continue annual followup.  #2. Rheumatoid arthritis  #3. Postural orthostatic hypotension syndrome   CC: Patient Care Team: [name], MD as PCP - General (Family Medicine)   Levert Feinstein, MD 1/7/20155:02 PM

## 2013-03-16 ENCOUNTER — Telehealth: Payer: Self-pay | Admitting: Internal Medicine

## 2013-03-16 NOTE — Telephone Encounter (Signed)
See 1/12 telephone note

## 2013-03-16 NOTE — Telephone Encounter (Signed)
Advised pt Jennifer [name] is going to make phone calls and we would be in touch soon.

## 2013-03-16 NOTE — Telephone Encounter (Signed)
New message     Want a referral to a specialist at [facility] or [place] for her problem.  She said Dr [name] was going to call her with a referral.

## 2013-03-17 NOTE — Telephone Encounter (Signed)
Follow up     Returning Jennifer [name]'s call regarding referral to specialist

## 2013-03-20 NOTE — Telephone Encounter (Signed)
Pt called again today because she states the disability claim is getting ready to close her claim. The company needs to know if she is going to be in permanent disability or not. The company needs to know something asap. Pt needs to be call as soon as possible. Pt states she is a single mom can not afford to lose her insurance. Pt  is aware that Dr. [name] will be in the office on Tuesday next week.

## 2013-03-20 NOTE — Telephone Encounter (Signed)
Follow up     1. Disability claim is getting ready to close her claim. Patient stated she is a single mom cannot afford to lose her insurance   2. Referral to [facility] or [place] for pots.

## 2013-03-24 NOTE — Telephone Encounter (Signed)
Routing disability claim question to Selena Batten in medical records

## 2013-03-24 NOTE — Telephone Encounter (Signed)
Pt states that ins. will want to know plan soon - advised still indefinite at this point, as further referrals are being evaluated. She was inquiring about Dr. Berneice Gandy referral again - I explained Dr. [name] is still working on things. I explained I would call her after talking with Dr. [name]. She is agreeable to plan.

## 2013-03-25 NOTE — Telephone Encounter (Signed)
Advised pt that we have been unable to get in contact/locate Dr. Berneice Gandy. Dr. [name] would like to see pt in office to determine plan of care. Sent to scheduling to schedule pt. Pt agreeable to plan.

## 2013-03-26 NOTE — Telephone Encounter (Signed)
Pt asking about RA pain medication while she is in between doctors - I advised her she would need to see her PCP, Dr. [name] recommendation. She is understandable and agreeable to plan.

## 2013-03-30 ENCOUNTER — Other Ambulatory Visit: Payer: Self-pay

## 2013-04-06 ENCOUNTER — Telehealth: Payer: Self-pay | Admitting: Internal Medicine

## 2013-04-06 ENCOUNTER — Ambulatory Visit: Payer: 59 | Admitting: Internal Medicine

## 2013-04-06 NOTE — Telephone Encounter (Signed)
Informed pt that Dr. Berneice Gandy referral will be a slight delay (secondary to wife passing) and he is out of office for next little while. Explained that office should be in touch once he is back. Pt agreeable to plan .

## 2013-04-06 NOTE — Telephone Encounter (Signed)
New Prob    Calling regarding a referral to Dr. Rema Jasmine. Please call.

## 2013-04-06 NOTE — Telephone Encounter (Signed)
Follow Up  Pt called states she has found the number for Dr. Kris Hartmann  [phone]

## 2013-04-08 NOTE — Telephone Encounter (Addendum)
Pt tells me she needs some sort of plan of care statement for her disability - stating this is what we are going to do, referral is being delayed, etc. She states that insurance needs to approve her again already. I informed her that [name] will be in office tomorrow and I would review with him then. She is agreeable to plan.    Please fax statement to [employer]  [phone]

## 2013-04-08 NOTE — Telephone Encounter (Signed)
Follow up         Pt needs a letter stating her current condition from dr [name]

## 2013-04-29 ENCOUNTER — Ambulatory Visit: Payer: 59 | Admitting: Internal Medicine

## 2013-05-04 ENCOUNTER — Encounter: Payer: Self-pay | Admitting: Oncology

## 2013-05-08 ENCOUNTER — Ambulatory Visit (INDEPENDENT_AMBULATORY_CARE_PROVIDER_SITE_OTHER): Payer: 59 | Admitting: Internal Medicine

## 2013-05-08 ENCOUNTER — Encounter: Payer: Self-pay | Admitting: Internal Medicine

## 2013-05-08 VITALS — BP 80/68 | HR 91 | Ht 67.0 in | Wt 131.9 lb

## 2013-05-08 DIAGNOSIS — G909 Disorder of the autonomic nervous system, unspecified: Secondary | ICD-10-CM

## 2013-05-08 DIAGNOSIS — G901 Familial dysautonomia [Riley-Day]: Secondary | ICD-10-CM

## 2013-05-08 NOTE — Patient Instructions (Signed)
Your physician wants you to follow-up in: 3 months with Dr. Klein. You will receive a reminder letter in the mail two months in advance. If you don't receive a letter, please call our office to schedule the follow-up appointment.  

## 2013-05-08 NOTE — Progress Notes (Signed)
Patient Care Team: [name], MD as PCP - General (Family Medicine) Levert Feinstein, MD as Consulting Physician (Oncology) [facility] Salvia, MD as Consulting Physician (Cardiology)   HPI  Jennifer [name] is a 48 y.o. female Seen in followup for dysautonomia manifested by hypotension and tachy palpitations  It was my intention to have her seen by Dr. Grayce Sessions or Dr. Bascom Levels at [facility]  She suffers both for hemachromatosis as well as rheumatoid arthritis. She also has primary and secondary anxiety and depression.  She has previously been treated with Florinef and ProAmatine the former she did not tolerate because of migraines the latter was discontinued following tilt table testing at [facility] where she had predominant tachycardia; at that juncture her blood pressures were better and low-dose beta blockers were attempted. She failed to tolerate these.  She is pursuing recumbent exercise bicycle as well as yoga. This is a been somewhat better. She has significant fatigue. She has not tolerated Wellbutrin in the past.   Past Medical History  Diagnosis Date  . Headache(784.0)     migraines  . Orthostatic syncope   . Arthritis     inactive RA- no meds  . IBS (irritable bowel syndrome)   . Dysrhythmia     PVCs  . Depression   . Anxiety   . Hemochromatosis   . Rheumatoid arthritis(714.0) 02/04/2012    Past Surgical History  Procedure Laterality Date  . Patent foramen ovale closure  2008  . Cholecystectomy  1989  . Dilation and curettage of uterus  2004  . Laparoscopy    . Laparoscopic tubal ligation  10/26/2010    Procedure: LAPAROSCOPIC TUBAL LIGATION;  Surgeon: Zelphia Cairo;  Location: [facility];  Service: Gynecology;  Laterality: Bilateral;    Current Outpatient Prescriptions  Medication Sig Dispense Refill  . acetaminophen-codeine (TYLENOL #3) 300-30 MG per tablet Take 1 tablet by mouth every 4 (four) hours as needed for moderate pain.      .  clidinium-chlordiazePOXIDE (LIBRAX) 5-2.5 MG per capsule Take 1 capsule by mouth as needed.      . clonazePAM (KLONOPIN) 1 MG tablet Take 1 tablet (1 mg total) by mouth daily as needed for anxiety. For anxiety  28 tablet  0  . desvenlafaxine (PRISTIQ) 100 MG 24 hr tablet Take 1 tablet (100 mg total) by mouth daily. For depression  30 tablet  0  . dextran 70-hypromellose (TEARS RENEWED) ophthalmic solution Place 2 drops into both eyes as needed (dry eyes).      Marland Kitchen etanercept (ENBREL) 50 MG/ML injection Inject 50 mg into the skin once a week.      Marland Kitchen ibuprofen (ADVIL,MOTRIN) 200 MG tablet Take 4 tablets (800 mg total) by mouth every 8 (eight) hours as needed (for migraine).  30 tablet  0  . methocarbamol (ROBAXIN) 500 MG tablet Take 500 mg by mouth as needed for muscle spasms.      . ondansetron (ZOFRAN-ODT) 8 MG disintegrating tablet Place 1 tablet (8 mg total) inside cheek as needed.  30 tablet  6  . ramelteon (ROZEREM) 8 MG tablet Take 8 mg by mouth at bedtime as needed for sleep.      Marland Kitchen topiramate (TOPAMAX) 100 MG tablet Take 3 tablets (300 mg total) by mouth daily. For mood stabilization  30 tablet  0   No current facility-administered medications for this visit.    Allergies  Allergen Reactions  . Leflunomide Diarrhea  . Methotrexate Derivatives Hives  .  Reglan [Metoclopramide]     Jerky   . Remicade [Infliximab] Hives, Diarrhea and Other (See Comments)    Increase heart rate,decrease blood pressure  . Morphine And Related Rash    Rxn to IV morphine; has never had PO morphine  . Sulfonamide Derivatives Rash    Review of Systems negative except from HPI and PMH  Physical Exam BP 80/68  Pulse 91  Ht 5\' 7"  (1.702 m)  Wt 131 lb 14.4 oz (59.829 kg)  BMI 20.65 kg/m2 Well developed and well nourished in no acute distress HENT normal E scleral and icterus clear Neck Supple JVP flat; carotids brisk and full Clear to ausculation  Regular rate and rhythm, no murmurs gallops or  rub Soft with active bowel sounds No clubbing cyanosis none Edema Alert and oriented, grossly normal motor and sensory function Skin Warm and Dry arachnodactyly  ECG sinus rhythm at 91 intervals 13/08/35  Assessment and  Plan  Dysautonomia  Rheumatoid arthritis  Depression-primary and secondary  Hemochromatosis-homozygous  Arachnodactyly  Currently hypotension is her major manifestation. We will again try ProAmatine 5 mg 3 times a day. She is to see Dr. 15/08/35 as part of the syncope programmed on at [facility] today. I will await his input prior to asking her to initiate this.  Also thought that she might benefit from pro-Vigil/NuVigil for her profound fatigue.  I await input from [facility].

## 2013-05-12 ENCOUNTER — Ambulatory Visit: Payer: 59 | Admitting: Cardiology

## 2013-05-15 ENCOUNTER — Telehealth: Payer: Self-pay | Admitting: Internal Medicine

## 2013-05-15 NOTE — Telephone Encounter (Signed)
Records rec from [facility] have to [place] Dr.Klein Pt

## 2013-05-15 NOTE — Telephone Encounter (Signed)
Inquiring as to whether we received reports from Dr. Kennedy Bucker at [facility] or Dr Purnell Shoemaker at [facility]. Dr Kennedy Bucker started Mestinon TID, discussed exercise, and f/u w/ him in 3 months. Pt wants Dr. Odessa Fleming opinion on this medication. Will review with him when he returns next week. Pt agreeable to plan.    Pt wants Dr.Klein to know that she was very impressed with Dr. Kennedy Bucker. She really liked him.

## 2013-05-15 NOTE — Telephone Encounter (Signed)
New Message  Pt called to give an update on her visit to [facility], She is requesting a call back to discuss and to confirm that the records were recived from [facility]-- Dr. Purnell Shoemaker. Please assist

## 2013-05-19 NOTE — Telephone Encounter (Signed)
Follow up     Pt cannot tolerate medication prescribed from the doctor at [facility]. She want to take medication Dr [name] want her to take.  Please advise

## 2013-05-20 NOTE — Telephone Encounter (Signed)
Pt advised to stop the Mestinon. Pt c/o of constant diarrhea/nausea since starting medication.  She states she is taking Midodrine as directed by Dr. [name]. I will inform Dr. [name].

## 2013-05-21 ENCOUNTER — Telehealth: Payer: Self-pay | Admitting: *Deleted

## 2013-05-21 ENCOUNTER — Other Ambulatory Visit: Payer: Self-pay | Admitting: *Deleted

## 2013-05-21 MED ORDER — MIDODRINE HCL 10 MG PO TABS: 10.0000 mg | ORAL_TABLET | Freq: Three times a day (TID) | ORAL | Status: DC

## 2013-05-21 NOTE — Telephone Encounter (Signed)
Dr. [name] spoke with this patient. Restart Midodrine 10 mg TID Rx sent to [facility] on [address].

## 2013-05-28 ENCOUNTER — Telehealth: Payer: Self-pay | Admitting: Internal Medicine

## 2013-05-28 NOTE — Telephone Encounter (Signed)
New message          Kennyth Arnold would like to know if the pt is able to return back to work.

## 2013-06-01 ENCOUNTER — Other Ambulatory Visit: Payer: Self-pay | Admitting: *Deleted

## 2013-06-01 MED ORDER — MIDODRINE HCL 10 MG PO TABS: 10.0000 mg | ORAL_TABLET | Freq: Four times a day (QID) | ORAL | Status: DC

## 2013-06-01 NOTE — Telephone Encounter (Addendum)
Informed Kennyth Arnold that pt is unable to return to work at this time, per Dr. [name]. She started asking for details/reasoning for this recommendation - I explained/asked that they fax [place] over requested information needed and we would fill out paperwork. Device clinic fax number given. Kennyth Arnold thanked me for my help.

## 2013-06-01 NOTE — Telephone Encounter (Signed)
Rx changed to - 4 times daily, per Dr [name]

## 2013-06-16 ENCOUNTER — Telehealth: Payer: Self-pay | Admitting: Internal Medicine

## 2013-06-16 NOTE — Telephone Encounter (Signed)
New message     Question about tachycardia

## 2013-06-16 NOTE — Telephone Encounter (Signed)
Patient asking what allergy medication she can take w/ tachycardia issues (POTS patient) - advised to avoid meds w/ decongestant, avoid sudafeds. Take plain allergy med w/o decongestant. Patient verbalized understanding.

## 2013-07-02 ENCOUNTER — Telehealth: Payer: Self-pay | Admitting: Internal Medicine

## 2013-07-02 ENCOUNTER — Other Ambulatory Visit (HOSPITAL_COMMUNITY): Payer: Self-pay | Admitting: *Deleted

## 2013-07-02 NOTE — Telephone Encounter (Signed)
Patient states she has been experiencing the following complaints over the past week: Rapid heartrate 113-153.  Currently 153 today but has been as low as 113 while lying down at rest. States it does feel out of rhythm at times ("pvcs sometimes").  Patient is a POTS patient.  Current BP 75/45, however she typically runs low BP.  States that she is also having chest pain that feels like she has been coughing for several days "meaning it is sore and painful". Patient denies being on anticoagulation. Advised patient to go to Emergency Room for evaluation since heart rate was so elevated and BP low with current chest pain (4 on a 10 scale). Patient verbalized understanding and agreement of advisement. Informed her that I would notify Dr. [name] and Dory Horn, RN.  Dr. [name] notified and he called patient advising her of the following recommendations:  1) use her abdominal binder, 2) take Midrin as directed, and 3) go to Short Stay Department at [facility] in the AM to receive fluids intraveneously.  Patient is to call Dr. [name] tomorrow for update and again in two weeks to provide him with follow up on medical status.  Patient verbalized agreement, appreciation and understanding.

## 2013-07-02 NOTE — Telephone Encounter (Signed)
New message   Patient is aware that Roanna Raider is in the office today.    Patient C/O heart rate running high. Blood pressure is low . Today  75/45 . Hr 153 , took a medication  Once on Saturday . Retook heart rate  130  - range. Having chest pain since Saturday.       chest pain  Feels like after you been coughing for several days.     Pain level  4. Does get bad at times .

## 2013-07-03 ENCOUNTER — Encounter (HOSPITAL_COMMUNITY)
Admission: RE | Admit: 2013-07-03 | Discharge: 2013-07-03 | Disposition: A | Discharge status: Home / Self Care | Payer: 59 | Source: Ambulatory Visit | Attending: Internal Medicine | Admitting: Internal Medicine

## 2013-07-03 DIAGNOSIS — I498 Other specified cardiac arrhythmias: Secondary | ICD-10-CM | POA: Diagnosis not present

## 2013-07-03 MED ORDER — SODIUM CHLORIDE 0.9 % IV SOLN
INTRAVENOUS | Status: DC
  Administered 2013-07-03: 11:00:00 via INTRAVENOUS

## 2013-07-09 ENCOUNTER — Telehealth: Payer: Self-pay | Admitting: Internal Medicine

## 2013-07-09 NOTE — Telephone Encounter (Signed)
New problem   Pt stated she spoke to Dr [name] last Friday and he advise her to come in office and see him in 3wks. Please call pt with appt.

## 2013-07-09 NOTE — Telephone Encounter (Signed)
Explained that we didn't want office visit in couple weeks - Dr. [name] wanted her to update with her medical status in couple weeks. She tells me that IVF through short stay helped last week, but after few days she felt like before. HR up and down still.  Will increase IVF prn frequency to weekly, per Dr. [name].  Notified pt, faxed new prn orders to short stay. Patient verbalized understanding and agreeable to plan.

## 2013-07-10 ENCOUNTER — Other Ambulatory Visit (HOSPITAL_COMMUNITY): Payer: Self-pay

## 2013-07-10 ENCOUNTER — Telehealth: Payer: Self-pay | Admitting: *Deleted

## 2013-07-10 NOTE — Telephone Encounter (Signed)
Renee from short stay calls b/c pt has come in for IVfluids. They did not receive the order that this was to be weekly for her POTS. Pt did not have an appointment. I have called [facility] short stay & [facility] urgent care without availability.  Renee & pt have worked out a schedule for pt to come in on Tuesday for IV fluids on a weekly basis.  As a reminder we need to make sure we call short stay & set up an appointment for patients to have IV fluids given. They do not do walk in appointments as this is what happened today. Problem resolved.  Mylo Red RN

## 2013-07-13 ENCOUNTER — Other Ambulatory Visit (HOSPITAL_COMMUNITY): Payer: Self-pay | Admitting: *Deleted

## 2013-07-14 ENCOUNTER — Inpatient Hospital Stay (HOSPITAL_COMMUNITY): Admission: RE | Admit: 2013-07-14 | Payer: Self-pay | Source: Ambulatory Visit

## 2013-07-16 ENCOUNTER — Encounter (HOSPITAL_COMMUNITY)
Admission: RE | Admit: 2013-07-16 | Discharge: 2013-07-16 | Disposition: A | Discharge status: Home / Self Care | Payer: 59 | Source: Ambulatory Visit | Attending: Internal Medicine | Admitting: Internal Medicine

## 2013-07-16 DIAGNOSIS — I498 Other specified cardiac arrhythmias: Secondary | ICD-10-CM | POA: Diagnosis not present

## 2013-07-16 MED ORDER — SODIUM CHLORIDE 0.9 % IV SOLN
INTRAVENOUS | Status: DC
  Administered 2013-07-16: 2000 mL via INTRAVENOUS

## 2013-07-17 ENCOUNTER — Encounter (HOSPITAL_COMMUNITY): Payer: Self-pay

## 2013-07-22 ENCOUNTER — Encounter (HOSPITAL_COMMUNITY): Payer: Self-pay

## 2013-07-22 ENCOUNTER — Telehealth: Payer: Self-pay | Admitting: Internal Medicine

## 2013-07-22 NOTE — Telephone Encounter (Signed)
New message    Update for the nurse  Fluids only help for a day and a half---then heart rate goes up and bp goes down.  Today HR is 142 an bp is 74/44.  Going to have fluids on Friday. Just wanted you to know.

## 2013-07-23 NOTE — Telephone Encounter (Signed)
Pt tells me that she is being helped by IVF, but it only lasts 24-36 hours before she is back to where she was before the fluids. Blood pressure have been dropping as low as 62/44, but HR remains elevated in 130-140s. The increased HR has caused some CP. She would like to know if she can get HH fluids b/c she has had to reschedule appts for fluids because she can't get there. Her mother is able to take her tomorrow to get more IVF at short stay. Pt is aware that I will review with Dr. [name] Tuesday, when he is back in office, and agreeable to plan.

## 2013-07-23 NOTE — Telephone Encounter (Signed)
Patient is concerned and would like for you to give her call back. Please call and advise.

## 2013-07-24 ENCOUNTER — Encounter (HOSPITAL_COMMUNITY)
Admission: RE | Admit: 2013-07-24 | Discharge: 2013-07-24 | Disposition: A | Discharge status: Home / Self Care | Payer: 59 | Source: Ambulatory Visit | Attending: Internal Medicine | Admitting: Internal Medicine

## 2013-07-24 DIAGNOSIS — I498 Other specified cardiac arrhythmias: Secondary | ICD-10-CM | POA: Diagnosis not present

## 2013-07-24 MED ORDER — SODIUM CHLORIDE 0.9 % IV SOLN
INTRAVENOUS | Status: DC
  Administered 2013-07-24: 12:00:00 via INTRAVENOUS

## 2013-07-29 ENCOUNTER — Other Ambulatory Visit: Payer: Self-pay | Admitting: *Deleted

## 2013-07-29 MED ORDER — MIDODRINE HCL 10 MG PO TABS: 20.0000 mg | ORAL_TABLET | Freq: Four times a day (QID) | ORAL | Status: DC

## 2013-07-29 NOTE — Telephone Encounter (Addendum)
Explained that we will no longer be offering PRN IVF through short stay - discussed the new studies/findings that have come out showing no real improvement in symptoms with IVF.  Advised to increase Proamatine to 20 mg four times daily. Pt is also using her abdominal binder and only notices a "slight" difference. Inquired about previously taking Florinef and pt tells me she had bad reaction (severe migraines) when taking med, so we stopped it. I have added it to her allergy list. She also tells me she took a propranolol Monday night for increased HR. Will review w/ Dr. [name] for any further recommendations. Pt is agreeable to this plan.

## 2013-07-29 NOTE — Telephone Encounter (Signed)
F/u   Pt waiting on a call from nurse for an appt. Please call pt.

## 2013-07-30 ENCOUNTER — Other Ambulatory Visit: Payer: Self-pay | Admitting: *Deleted

## 2013-07-30 MED ORDER — PROPRANOLOL HCL 10 MG PO TABS: 10.0000 mg | ORAL_TABLET | Freq: Every day | ORAL | Status: DC | PRN

## 2013-07-30 NOTE — Telephone Encounter (Signed)
Discussed her fluid intake and stressed to her that a majority of her fluid intake should be primarily electrolyte fluids. Also informed her that we would like to have her come in Monday to pick up container for a 24 hour urine test to measure specific gravity and sodium. Pt is agreeable and will come by office on Monday.

## 2013-07-30 NOTE — Telephone Encounter (Signed)
Late Entry: phone call from 5/27 at 4:23 pm: Called and advised pt that we are sending in rx for Propranolol (the propranolol she took was old 60mg  ER tablet), and to not take 60 mg tab again. Advised that Dr. wants her taking this medication as rarely as possible.

## 2013-07-31 ENCOUNTER — Encounter (HOSPITAL_COMMUNITY): Payer: Self-pay

## 2013-08-03 ENCOUNTER — Other Ambulatory Visit: Payer: Self-pay

## 2013-08-07 ENCOUNTER — Other Ambulatory Visit: Payer: Self-pay | Admitting: *Deleted

## 2013-08-07 ENCOUNTER — Ambulatory Visit: Payer: Self-pay | Admitting: *Deleted

## 2013-08-10 ENCOUNTER — Ambulatory Visit: Payer: Self-pay | Admitting: *Deleted

## 2013-08-11 ENCOUNTER — Encounter (HOSPITAL_COMMUNITY): Payer: Self-pay | Admitting: Emergency Medicine

## 2013-08-11 ENCOUNTER — Inpatient Hospital Stay (HOSPITAL_COMMUNITY)
Admission: EM | Admit: 2013-08-11 | Discharge: 2013-08-14 | DRG: 918 | Disposition: A | Discharge status: Other Acute Inpatient Hospital | Payer: 59 | Attending: Internal Medicine | Admitting: Internal Medicine

## 2013-08-11 DIAGNOSIS — Z882 Allergy status to sulfonamides status: Secondary | ICD-10-CM

## 2013-08-11 DIAGNOSIS — I498 Other specified cardiac arrhythmias: Secondary | ICD-10-CM | POA: Diagnosis present

## 2013-08-11 DIAGNOSIS — Z885 Allergy status to narcotic agent status: Secondary | ICD-10-CM

## 2013-08-11 DIAGNOSIS — F331 Major depressive disorder, recurrent, moderate: Secondary | ICD-10-CM

## 2013-08-11 DIAGNOSIS — F339 Major depressive disorder, recurrent, unspecified: Secondary | ICD-10-CM

## 2013-08-11 DIAGNOSIS — T50902S Poisoning by unspecified drugs, medicaments and biological substances, intentional self-harm, sequela: Secondary | ICD-10-CM

## 2013-08-11 DIAGNOSIS — K589 Irritable bowel syndrome without diarrhea: Secondary | ICD-10-CM

## 2013-08-11 DIAGNOSIS — R000 Tachycardia, unspecified: Secondary | ICD-10-CM

## 2013-08-11 DIAGNOSIS — I951 Orthostatic hypotension: Secondary | ICD-10-CM

## 2013-08-11 DIAGNOSIS — T448X1A Poisoning by centrally-acting and adrenergic-neuron-blocking agents, accidental (unintentional), initial encounter: Secondary | ICD-10-CM

## 2013-08-11 DIAGNOSIS — T50992A Poisoning by other drugs, medicaments and biological substances, intentional self-harm, initial encounter: Secondary | ICD-10-CM

## 2013-08-11 DIAGNOSIS — Z609 Problem related to social environment, unspecified: Secondary | ICD-10-CM

## 2013-08-11 DIAGNOSIS — M069 Rheumatoid arthritis, unspecified: Secondary | ICD-10-CM

## 2013-08-11 DIAGNOSIS — M797 Fibromyalgia: Secondary | ICD-10-CM

## 2013-08-11 DIAGNOSIS — Z9851 Tubal ligation status: Secondary | ICD-10-CM

## 2013-08-11 DIAGNOSIS — F411 Generalized anxiety disorder: Secondary | ICD-10-CM

## 2013-08-11 DIAGNOSIS — R110 Nausea: Secondary | ICD-10-CM

## 2013-08-11 DIAGNOSIS — Z862 Personal history of diseases of the blood and blood-forming organs and certain disorders involving the immune mechanism: Secondary | ICD-10-CM

## 2013-08-11 DIAGNOSIS — F332 Major depressive disorder, recurrent severe without psychotic features: Secondary | ICD-10-CM | POA: Diagnosis present

## 2013-08-11 DIAGNOSIS — Z9089 Acquired absence of other organs: Secondary | ICD-10-CM

## 2013-08-11 DIAGNOSIS — D696 Thrombocytopenia, unspecified: Secondary | ICD-10-CM | POA: Diagnosis present

## 2013-08-11 DIAGNOSIS — Q078 Other specified congenital malformations of nervous system: Secondary | ICD-10-CM

## 2013-08-11 DIAGNOSIS — R1032 Left lower quadrant pain: Secondary | ICD-10-CM

## 2013-08-11 DIAGNOSIS — T462X1A Poisoning by other antidysrhythmic drugs, accidental (unintentional), initial encounter: Principal | ICD-10-CM | POA: Diagnosis present

## 2013-08-11 DIAGNOSIS — T50902A Poisoning by unspecified drugs, medicaments and biological substances, intentional self-harm, initial encounter: Secondary | ICD-10-CM

## 2013-08-11 DIAGNOSIS — T50904A Poisoning by unspecified drugs, medicaments and biological substances, undetermined, initial encounter: Secondary | ICD-10-CM

## 2013-08-11 DIAGNOSIS — IMO0001 Reserved for inherently not codable concepts without codable children: Secondary | ICD-10-CM

## 2013-08-11 DIAGNOSIS — G90A Postural orthostatic tachycardia syndrome (POTS): Secondary | ICD-10-CM

## 2013-08-11 DIAGNOSIS — Z87898 Personal history of other specified conditions: Secondary | ICD-10-CM

## 2013-08-11 DIAGNOSIS — G4700 Insomnia, unspecified: Secondary | ICD-10-CM

## 2013-08-11 DIAGNOSIS — T447X2S Poisoning by beta-adrenoreceptor antagonists, intentional self-harm, sequela: Secondary | ICD-10-CM

## 2013-08-11 DIAGNOSIS — F410 Panic disorder [episodic paroxysmal anxiety] without agoraphobia: Secondary | ICD-10-CM

## 2013-08-11 DIAGNOSIS — Z888 Allergy status to other drugs, medicaments and biological substances status: Secondary | ICD-10-CM

## 2013-08-11 DIAGNOSIS — Z79899 Other long term (current) drug therapy: Secondary | ICD-10-CM

## 2013-08-11 DIAGNOSIS — T50901A Poisoning by unspecified drugs, medicaments and biological substances, accidental (unintentional), initial encounter: Secondary | ICD-10-CM

## 2013-08-11 DIAGNOSIS — T447X2A Poisoning by beta-adrenoreceptor antagonists, intentional self-harm, initial encounter: Secondary | ICD-10-CM

## 2013-08-11 DIAGNOSIS — G2581 Restless legs syndrome: Secondary | ICD-10-CM

## 2013-08-11 HISTORY — DX: Other specified cardiac arrhythmias: I49.8

## 2013-08-11 HISTORY — DX: Postural orthostatic tachycardia syndrome (POTS): G90.A

## 2013-08-11 HISTORY — DX: Tachycardia, unspecified: R00.0

## 2013-08-11 HISTORY — DX: Orthostatic hypotension: I95.1

## 2013-08-11 LAB — COMPREHENSIVE METABOLIC PANEL
ALK PHOS: 59 U/L (ref 39–117)
ALT: 15 U/L (ref 0–35)
AST: 18 U/L (ref 0–37)
Albumin: 3.8 g/dL (ref 3.5–5.2)
BUN: 12 mg/dL (ref 6–23)
CALCIUM: 9.1 mg/dL (ref 8.4–10.5)
CO2: 17 meq/L — AB (ref 19–32)
Chloride: 108 mEq/L (ref 96–112)
Creatinine, Ser: 0.82 mg/dL (ref 0.50–1.10)
GFR, EST NON AFRICAN AMERICAN: 85 mL/min — AB (ref >90)
GLUCOSE: 94 mg/dL (ref 70–99)
Potassium: 3.9 mEq/L (ref 3.7–5.3)
SODIUM: 139 meq/L (ref 137–147)
Total Bilirubin: 0.4 mg/dL (ref 0.3–1.2)
Total Protein: 6.6 g/dL (ref 6.0–8.3)

## 2013-08-11 LAB — RAPID URINE DRUG SCREEN, HOSP PERFORMED
Amphetamines: NOT DETECTED
Barbiturates: NOT DETECTED
Benzodiazepines: POSITIVE — AB
COCAINE: NOT DETECTED
OPIATES: NOT DETECTED
TETRAHYDROCANNABINOL: NOT DETECTED

## 2013-08-11 LAB — ACETAMINOPHEN LEVEL: Acetaminophen (Tylenol), Serum: <15 ug/mL (ref 10–30)

## 2013-08-11 LAB — BASIC METABOLIC PANEL
BUN: 9 mg/dL (ref 6–23)
CALCIUM: 8.1 mg/dL — AB (ref 8.4–10.5)
CO2: 22 mEq/L (ref 19–32)
Chloride: 111 mEq/L (ref 96–112)
Creatinine, Ser: 0.76 mg/dL (ref 0.50–1.10)
GFR calc non Af Amer: >90 mL/min (ref >90)
Glucose, Bld: 95 mg/dL (ref 70–99)
Potassium: 4.1 mEq/L (ref 3.7–5.3)
SODIUM: 141 meq/L (ref 137–147)

## 2013-08-11 LAB — CBC WITH DIFFERENTIAL/PLATELET
Basophils Absolute: 0 10*3/uL (ref 0.0–0.1)
Basophils Relative: 1 % (ref 0–1)
EOS ABS: 0.1 10*3/uL (ref 0.0–0.7)
Eosinophils Relative: 2 % (ref 0–5)
HCT: 37.8 % (ref 36.0–46.0)
Hemoglobin: 13.1 g/dL (ref 12.0–15.0)
Lymphocytes Relative: 55 % — ABNORMAL HIGH (ref 12–46)
Lymphs Abs: 3.6 10*3/uL (ref 0.7–4.0)
MCH: 33.1 pg (ref 26.0–34.0)
MCHC: 34.7 g/dL (ref 30.0–36.0)
MCV: 95.5 fL (ref 78.0–100.0)
Monocytes Absolute: 0.6 10*3/uL (ref 0.1–1.0)
Monocytes Relative: 9 % (ref 3–12)
NEUTROS ABS: 2.3 10*3/uL (ref 1.7–7.7)
NEUTROS PCT: 35 % — AB (ref 43–77)
PLATELETS: 163 10*3/uL (ref 150–400)
RBC: 3.96 MIL/uL (ref 3.87–5.11)
RDW: 12.3 % (ref 11.5–15.5)
WBC: 6.6 10*3/uL (ref 4.0–10.5)

## 2013-08-11 LAB — SALICYLATE LEVEL: Salicylate Lvl: <2 mg/dL — ABNORMAL LOW (ref 2.8–20.0)

## 2013-08-11 LAB — SODIUM, URINE, 24 HOUR
SODIUM UR: 50 mmol/L
Sodium, 24H Ur: 85 mmol/24 hr (ref 40–220)

## 2013-08-11 LAB — LACTIC ACID, PLASMA: LACTIC ACID, VENOUS: 1.5 mmol/L (ref 0.5–2.2)

## 2013-08-11 LAB — MRSA PCR SCREENING: MRSA BY PCR: NEGATIVE

## 2013-08-11 MED ORDER — LORAZEPAM 2 MG/ML IJ SOLN
0.5000 mg | INTRAMUSCULAR | Status: DC | PRN
  Administered 2013-08-11: 0.5 mg via INTRAVENOUS
  Filled 2013-08-11: qty 1

## 2013-08-11 MED ORDER — ACETAMINOPHEN 325 MG PO TABS
650.0000 mg | ORAL_TABLET | Freq: Four times a day (QID) | ORAL | Status: DC | PRN
  Administered 2013-08-11 – 2013-08-12 (×3): 650 mg via ORAL
  Filled 2013-08-11 (×3): qty 2

## 2013-08-11 MED ORDER — RAMELTEON 8 MG PO TABS
8.0000 mg | ORAL_TABLET | Freq: Every evening | ORAL | Status: DC | PRN
  Administered 2013-08-12 – 2013-08-13 (×3): 8 mg via ORAL
  Filled 2013-08-11 (×5): qty 1

## 2013-08-11 MED ORDER — SODIUM CHLORIDE 0.9 % IV SOLN: Freq: Once | INTRAVENOUS | Status: DC

## 2013-08-11 MED ORDER — HYDROMORPHONE HCL PF 1 MG/ML IJ SOLN: 0.5000 mg | INTRAMUSCULAR | Status: DC | PRN

## 2013-08-11 MED ORDER — DIPHENHYDRAMINE HCL 25 MG PO CAPS
25.0000 mg | ORAL_CAPSULE | Freq: Four times a day (QID) | ORAL | Status: DC | PRN
  Administered 2013-08-11 – 2013-08-14 (×5): 25 mg via ORAL
  Filled 2013-08-11 (×6): qty 1

## 2013-08-11 MED ORDER — CLONAZEPAM 1 MG PO TABS
1.0000 mg | ORAL_TABLET | Freq: Once | ORAL | Status: AC
  Administered 2013-08-11: 1 mg via ORAL
  Filled 2013-08-11: qty 1

## 2013-08-11 MED ORDER — ONDANSETRON HCL 4 MG/2ML IJ SOLN
4.0000 mg | Freq: Four times a day (QID) | INTRAMUSCULAR | Status: DC | PRN
  Administered 2013-08-12: 4 mg via INTRAVENOUS
  Filled 2013-08-11: qty 2

## 2013-08-11 MED ORDER — SODIUM CHLORIDE 0.9 % IV BOLUS (SEPSIS)
1000.0000 mL | Freq: Once | INTRAVENOUS | Status: AC
  Administered 2013-08-11: 1000 mL via INTRAVENOUS

## 2013-08-11 MED ORDER — ACETAMINOPHEN 650 MG RE SUPP: 650.0000 mg | Freq: Four times a day (QID) | RECTAL | Status: DC | PRN

## 2013-08-11 MED ORDER — VENLAFAXINE HCL ER 150 MG PO CP24
150.0000 mg | ORAL_CAPSULE | Freq: Every day | ORAL | Status: DC
  Administered 2013-08-12 – 2013-08-14 (×3): 150 mg via ORAL
  Filled 2013-08-11 (×5): qty 1

## 2013-08-11 MED ORDER — TOPIRAMATE 100 MG PO TABS
300.0000 mg | ORAL_TABLET | Freq: Every day | ORAL | Status: DC
  Administered 2013-08-11 – 2013-08-14 (×4): 300 mg via ORAL
  Filled 2013-08-11 (×4): qty 3

## 2013-08-11 MED ORDER — SODIUM CHLORIDE 0.9 % IV SOLN
INTRAVENOUS | Status: DC
  Administered 2013-08-12 (×3): via INTRAVENOUS

## 2013-08-11 MED ORDER — ATROPINE SULFATE 0.1 MG/ML IJ SOLN
0.5000 mg | INTRAMUSCULAR | Status: DC | PRN
  Filled 2013-08-11: qty 5

## 2013-08-11 MED ORDER — SODIUM CHLORIDE 0.9 % IV BOLUS (SEPSIS)
500.0000 mL | Freq: Once | INTRAVENOUS | Status: AC
  Administered 2013-08-11: 500 mL via INTRAVENOUS

## 2013-08-11 MED ORDER — ALUM & MAG HYDROXIDE-SIMETH 200-200-20 MG/5ML PO SUSP: 30.0000 mL | Freq: Four times a day (QID) | ORAL | Status: DC | PRN

## 2013-08-11 MED ORDER — LORAZEPAM 2 MG/ML IJ SOLN: 1.0000 mg | INTRAMUSCULAR | Status: DC | PRN

## 2013-08-11 MED ORDER — ENOXAPARIN SODIUM 40 MG/0.4ML ~~LOC~~ SOLN
40.0000 mg | SUBCUTANEOUS | Status: DC
  Administered 2013-08-11 – 2013-08-13 (×3): 40 mg via SUBCUTANEOUS
  Filled 2013-08-11 (×5): qty 0.4

## 2013-08-11 MED ORDER — CHARCOAL ACTIVATED PO LIQD
50.0000 g | Freq: Once | ORAL | Status: AC
  Administered 2013-08-11: 50 g via ORAL
  Filled 2013-08-11: qty 240

## 2013-08-11 MED ORDER — ONDANSETRON HCL 4 MG PO TABS: 4.0000 mg | ORAL_TABLET | Freq: Four times a day (QID) | ORAL | Status: DC | PRN

## 2013-08-11 MED ORDER — MIDODRINE HCL 5 MG PO TABS
20.0000 mg | ORAL_TABLET | Freq: Four times a day (QID) | ORAL | Status: DC
  Administered 2013-08-11 – 2013-08-14 (×9): 20 mg via ORAL
  Filled 2013-08-11 (×14): qty 4

## 2013-08-11 MED ORDER — HYDROCODONE-ACETAMINOPHEN 5-325 MG PO TABS
1.0000 | ORAL_TABLET | Freq: Four times a day (QID) | ORAL | Status: DC | PRN
  Administered 2013-08-12 – 2013-08-14 (×5): 1 via ORAL
  Filled 2013-08-11 (×6): qty 1

## 2013-08-11 NOTE — Progress Notes (Signed)
Pt now with b/p of 92/61. Pt c/o of anxiety. Pt has order for ativan but only for seizures. Called midlevel to see if can give for anxiety. Awaiting call back.

## 2013-08-11 NOTE — ED Notes (Signed)
Per EMS patient took 21 propanolol 60 mg at 7am in attempt to hurt herself-

## 2013-08-11 NOTE — ED Provider Notes (Signed)
CSN: [id number]     Arrival date & time 08/11/13  1123 History   First MD Initiated Contact with Patient 08/11/13 1130     Chief Complaint  Patient presents with  . Ingestion     (Consider location/radiation/quality/duration/timing/severity/associated sxs/prior Treatment) HPI  This is a 48 year old female who presents following an intentional overdose. Patient has a history of rheumatoid arthritis, hemachromatosis, POTS.  Patient reports that she took 20 one 60 mg extended-release propranolol at 7 AM. She reports that this was an attempt to hurt her self. She reports that she has had suicidal ideation in the past but never really tried to hurt her self. She reports increasing depression. Patient is currently complaining of chest burning and abdominal pain. Patient denies any coingestants including alcohol. She did take a clonazepam at 5:30 AM. Both clonazepam and propanolol are prescribed to the patient. She is currently awake, alert, and oriented to herself.  Past Medical History  Diagnosis Date  . Headache(784.0)     migraines  . Orthostatic syncope   . Arthritis     inactive RA- no meds  . IBS (irritable bowel syndrome)   . Dysrhythmia     PVCs  . Depression   . Anxiety   . Hemochromatosis   . Rheumatoid arthritis(714.0) 02/04/2012  . POTS (postural orthostatic tachycardia syndrome)    Past Surgical History  Procedure Laterality Date  . Patent foramen ovale closure  2008  . Cholecystectomy  1989  . Dilation and curettage of uterus  2004  . Laparoscopy    . Laparoscopic tubal ligation  10/26/2010    Procedure: LAPAROSCOPIC TUBAL LIGATION;  Surgeon: Zelphia Cairo;  Location: [facility];  Service: Gynecology;  Laterality: Bilateral;   Family History  Problem Relation Age of Onset  . Hemochromatosis Mother   . Hemochromatosis Sister    History  Substance Use Topics  . Smoking status: Never Smoker   . Smokeless tobacco: Never Used  . Alcohol Use: Yes     Comment: glass wine  per month   OB History   Grav Para Term Preterm Abortions TAB SAB Ect Mult Living                 Review of Systems  Constitutional: Negative for fever.  Respiratory: Negative for cough, chest tightness and shortness of breath.   Cardiovascular: Positive for chest pain.  Gastrointestinal: Positive for abdominal pain. Negative for nausea, vomiting and diarrhea.  Genitourinary: Negative for dysuria.  Musculoskeletal: Negative for back pain.  Neurological: Negative for syncope and headaches.  Psychiatric/Behavioral: Positive for suicidal ideas. Negative for confusion and agitation.  All other systems reviewed and are negative.     Allergies  Florinef; Leflunomide; Methotrexate derivatives; Reglan; Remicade; Morphine and related; and Sulfonamide derivatives  Home Medications   Prior to Admission medications   Medication Sig Start Date End Date Taking? Authorizing Provider  clidinium-chlordiazePOXIDE (LIBRAX) 5-2.5 MG per capsule Take 1 capsule by mouth as needed (for IBS symptoms).    Yes Historical Provider, MD  clonazePAM (KLONOPIN) 1 MG tablet Take 1 mg by mouth daily as needed for anxiety.   Yes Historical Provider, MD  desvenlafaxine (PRISTIQ) 100 MG 24 hr tablet Take 100 mg by mouth every morning.   Yes Historical Provider, MD  etanercept (ENBREL) 50 MG/ML injection Inject 50 mg into the skin once a week.   Yes Historical Provider, MD  midodrine (PROAMATINE) 10 MG tablet Take 2 tablets (20 mg total) by mouth 4 (four) times daily. 07/29/13  Yes Duke Salvia, MD  ondansetron (ZOFRAN-ODT) 8 MG disintegrating tablet Take 8 mg by mouth daily as needed for nausea or vomiting. Inside cheek   Yes Historical Provider, MD  propranolol (INDERAL) 10 MG tablet Take 10 mg by mouth daily as needed (for increased Heart rate).    Yes Historical Provider, MD  ramelteon (ROZEREM) 8 MG tablet Take 8 mg by mouth at bedtime as needed for sleep.   Yes Historical Provider, MD  topiramate (TOPAMAX) 100  MG tablet Take 3 tablets (300 mg total) by mouth daily. For mood stabilization 07/24/12  Yes Sanjuana Kava, NP   BP 96/63  Pulse 64  Temp(Src) 98.4 F (36.9 C) (Oral)  Resp 16  SpO2 100%  LMP 07/23/2013 Physical Exam  Nursing note and vitals reviewed. Constitutional: She is oriented to person, place, and time. She appears well-developed and well-nourished. No distress.  Flat affect  HENT:  Head: Normocephalic and atraumatic.  Mouth/Throat: Oropharynx is clear and moist.  Eyes: Pupils are equal, round, and reactive to light.  Neck: Neck supple.  Cardiovascular: Normal rate, regular rhythm and normal heart sounds.   No murmur heard. Pulmonary/Chest: Effort normal. No respiratory distress.  Abdominal: Soft. There is no tenderness. There is no rebound.  Musculoskeletal: She exhibits no edema.  Neurological: She is alert and oriented to person, place, and time.  Skin: Skin is warm and dry.  Psychiatric:  Flat affect    ED Course  Procedures (including critical care time)  CRITICAL CARE Performed by: Shon Baton   Total critical care time: 40 min  Critical care time was exclusive of separately billable procedures and treating other patients.  Critical care was necessary to treat or prevent imminent or life-threatening deterioration.  Critical care was time spent personally by me on the following activities: development of treatment plan with patient and/or surrogate as well as nursing, discussions with consultants, evaluation of patient's response to treatment, examination of patient, obtaining history from patient or surrogate, ordering and performing treatments and interventions, ordering and review of laboratory studies, ordering and review of radiographic studies, pulse oximetry and re-evaluation of patient's condition.  Labs Review Labs Reviewed  CBC WITH DIFFERENTIAL - Abnormal; Notable for the following:    Neutrophils Relative % 35 (*)    Lymphocytes Relative 55  (*)    All other components within normal limits  SALICYLATE LEVEL - Abnormal; Notable for the following:    Salicylate Lvl <2.0 (*)    All other components within normal limits  COMPREHENSIVE METABOLIC PANEL - Abnormal; Notable for the following:    CO2 17 (*)    GFR calc non Af Amer 85 (*)    All other components within normal limits  ACETAMINOPHEN LEVEL  URINE RAPID DRUG SCREEN (HOSP PERFORMED)  POC URINE PREG, ED    Imaging Review No results found.   EKG Interpretation   Date/Time:  Tuesday August 11 2013 11:28:56 EDT Ventricular Rate:  61 PR Interval:  133 QRS Duration: 66 QT Interval:  390 QTC Calculation: 393 R Axis:   85 Text Interpretation:  Age not entered, assumed to be  48 years old for  purpose of ECG interpretation Sinus rhythm Confirmed by Jarry Manon  MD,  Toni Amend ([id number]) on 08/11/2013 12:47:56 PM      MDM   Final diagnoses:  Suicide attempt by beta blocker overdose    Patient presents following a suicide attempt by overdosing on beta blockers.  Currently her vital signs are stable. Pulse  is 63 and blood pressure is 113/84.  EKG shows narrow complex QRS. Discussed with poison control and toxicology. They recommend a normal saline bolus and activated charcoal without sorbitol. The patient becomes unstable with drops in her blood pressure or heart rate, they recommended glucagon bolus plus infusion. Patient also risks for sodium channel blockade and widening of her QRS. If this were to occur, they recommend sodium bicarbonate.  Patient has remained hemodynamically stable in the emergency room. Lab work is largely unremarkable. Will continue cardiac monitoring. Discussed with hospitalist admission to step down unit for further monitoring. Hospitalist was given detailed instructions regarding further management if patient were to decline.  IVC paperwork initiated given patient's suicide attempt.      Shon Baton, MD 08/11/13 [phone]

## 2013-08-11 NOTE — ED Notes (Addendum)
Pt states the RX for propanolol is 60mg  ER.  This is her RX for POTS. States she currently has chest pain, abd pain, and feels tired.  Denies wanting to hurt herself at present. States she also took clonazapam 10mg  at 0530 today.

## 2013-08-11 NOTE — ED Notes (Signed)
Bed: WA20 Expected date:  Expected time:  Means of arrival:  Comments: EMS-OD 

## 2013-08-11 NOTE — Progress Notes (Signed)
Pt with b/p of 79/53 map of 60. Pt is non symptomatic. Called midlevel awaiting call back. Will continue to monitor.

## 2013-08-11 NOTE — H&P (Signed)
History and Physical       Hospital Admission Note Date: 08/11/2013  Patient name: Jennifer [name] Medical record number: [id number] Date of birth: 10/01/1965 Age: 48 y.o. Gender: female  PCP: [name], MD    Chief Complaint:  Suicide attempt  HPI: Patient is a 48 year old female with multiple medical problems including rheumatoid arthritis, fibromyalgia, depression, anxiety, postural hypotension (POTS on midodrine, propranolol) presented to the ER with intentional overdose today on propranolol. History was obtained from the patient who reported that she had argument with her older son who lives with his father (divorced from her) as she missed his graduation on the weekend (Sunday) due to not feeling well that day. Patient reports that after the argument she did not have back from him, hence she became very depressed and took 21 tablets of 60 mg extended release propranolol at 7 AM today. She states that she had only taken one tablet of clonazepam at 5:30 in the morning and did not take any other medications. She has one prior suicide attempt but did not require hospitalization. Patient feels overwhelmed with her situation, lost her job, house, older son decided to live with her ex-husband, now living in her parent's house. She has another 26 year old and  77-year-old sons.    Review of Systems:  Constitutional: Denies fever, chills, diaphoresis, poor appetite and fatigue.  HEENT: Denies photophobia, eye pain, redness, hearing loss, ear pain, congestion, sore throat, rhinorrhea, sneezing, mouth sores, trouble swallowing, neck pain, neck stiffness and tinnitus.   Respiratory: Denies SOB, DOE, cough, chest tightness,  and wheezing.   Cardiovascular: Denies palpitations and leg swelling.  she feels some chest tightness  Gastrointestinal: Denies nausea, vomiting, diarrhea, constipation, blood in stool and abdominal distention. +abdominal  cramping  Genitourinary: Denies dysuria, urgency, frequency, hematuria, flank pain and difficulty urinating.  Musculoskeletal: Denies myalgias, back pain, joint swelling, arthralgias and gait problem.  Skin: Denies pallor, rash and wound.  Neurological: Denies dizziness, seizures, syncope, weakness, light-headedness, numbness and headaches.  Hematological: Denies adenopathy. Easy bruising, personal or family bleeding history  Psychiatric/Behavioral: See history of present illness   Past Medical History: Past Medical History  Diagnosis Date  . Headache(784.0)     migraines  . Orthostatic syncope   . Arthritis     inactive RA- no meds  . IBS (irritable bowel syndrome)   . Dysrhythmia     PVCs  . Depression   . Anxiety   . Hemochromatosis   . Rheumatoid arthritis(714.0) 02/04/2012  . POTS (postural orthostatic tachycardia syndrome)    Past Surgical History  Procedure Laterality Date  . Patent foramen ovale closure  2008  . Cholecystectomy  1989  . Dilation and curettage of uterus  2004  . Laparoscopy    . Laparoscopic tubal ligation  10/26/2010    Procedure: LAPAROSCOPIC TUBAL LIGATION;  Surgeon: Zelphia Cairo;  Location: [facility];  Service: Gynecology;  Laterality: Bilateral;    Medications: Prior to Admission medications   Medication Sig Start Date End Date Taking? Authorizing Provider  clidinium-chlordiazePOXIDE (LIBRAX) 5-2.5 MG per capsule Take 1 capsule by mouth as needed (for IBS symptoms).    Yes Historical Provider, MD  clonazePAM (KLONOPIN) 1 MG tablet Take 1 mg by mouth daily as needed for anxiety.   Yes Historical Provider, MD  desvenlafaxine (PRISTIQ) 100 MG 24 hr tablet Take 100 mg by mouth every morning.   Yes Historical Provider, MD  etanercept (ENBREL) 50 MG/ML injection Inject 50 mg into the skin once a  week.   Yes Historical Provider, MD  midodrine (PROAMATINE) 10 MG tablet Take 2 tablets (20 mg total) by mouth 4 (four) times daily. 07/29/13  Yes Duke Salvia,  MD  ondansetron (ZOFRAN-ODT) 8 MG disintegrating tablet Take 8 mg by mouth daily as needed for nausea or vomiting. Inside cheek   Yes Historical Provider, MD  propranolol (INDERAL) 10 MG tablet Take 10 mg by mouth daily as needed (for increased Heart rate).    Yes Historical Provider, MD  ramelteon (ROZEREM) 8 MG tablet Take 8 mg by mouth at bedtime as needed for sleep.   Yes Historical Provider, MD  topiramate (TOPAMAX) 100 MG tablet Take 3 tablets (300 mg total) by mouth daily. For mood stabilization 07/24/12  Yes Sanjuana Kava, NP    Allergies:   Allergies  Allergen Reactions  . Florinef [Fludrocortisone] Other (See Comments)    Severe migraine  . Leflunomide Diarrhea  . Methotrexate Derivatives Hives  . Reglan [Metoclopramide]     Jerky   . Remicade [Infliximab] Hives, Diarrhea and Other (See Comments)    Increase heart rate,decrease blood pressure  . Morphine And Related Rash    Rxn to IV morphine; has never had PO morphine  . Sulfonamide Derivatives Rash    Social History:  reports that she has never smoked. She has never used smokeless tobacco. She reports that she drinks alcohol. Her drug history is not on file.  Family History: Family History  Problem Relation Age of Onset  . Hemochromatosis Mother   . Hemochromatosis Sister     Physical Exam: Blood pressure 96/63, pulse 66, temperature 98.4 F (36.9 C), temperature source Oral, resp. rate 18, last menstrual period 07/23/2013, SpO2 100.00%. General: Alert, awake, oriented x3, in no acute distress. HEENT: normocephalic, atraumatic, anicteric sclera, pink conjunctiva, pupils equal and reactive to light and accomodation, oropharynx clear Neck: supple, no masses or lymphadenopathy, no goiter, no bruits  Heart: Regular rate and rhythm, without murmurs, rubs or gallops. Lungs: Clear to auscultation bilaterally, no wheezing, rales or rhonchi. Abdomen: Soft, nontender, nondistended, positive bowel sounds, no  masses. Extremities: No clubbing, cyanosis or edema with positive pedal pulses. Neuro: Grossly intact, no focal neurological deficits, strength 5/5 upper and lower extremities bilaterally Psych: alert and oriented x 3, normal mood and affect Skin: no rashes or lesions, warm and dry   LABS on Admission:  Basic Metabolic Panel:  Recent Labs Lab 08/11/13 1155  NA 139  K 3.9  CL 108  CO2 17*  GLUCOSE 94  BUN 12  CREATININE 0.82  CALCIUM 9.1   Liver Function Tests:  Recent Labs Lab 08/11/13 1155  AST 18  ALT 15  ALKPHOS 59  BILITOT 0.4  PROT 6.6  ALBUMIN 3.8   No results found for this basename: LIPASE, AMYLASE,  in the last 168 hours No results found for this basename: AMMONIA,  in the last 168 hours CBC:  Recent Labs Lab 08/11/13 1144  WBC 6.6  NEUTROABS 2.3  HGB 13.1  HCT 37.8  MCV 95.5  PLT 163   Cardiac Enzymes: No results found for this basename: CKTOTAL, CKMB, CKMBINDEX, TROPONINI,  in the last 168 hours BNP: No components found with this basename: POCBNP,  CBG: No results found for this basename: GLUCAP,  in the last 168 hours   Radiological Exams on Admission: No results found.  Assessment/Plan Principal Problem:   Suicide attempt by beta blocker overdose - Will admit to step down, patient has received charcoal in  the ED. Monitor closely over the next 6-12 hours for the peak propranolol effect. - Per poison control guidelines, monitor for arrhythmias, worsening QTC due to sodium channel blocker effect of extended-release propranolol. Per poison control, if QRS is widening or any heart blocks, start patient on bicarbonate drip 46meq/kg, and glucagon bolus with IV drip ( 3 mg IV bolus, if response is favorable and 15 minutes, start infusion of 2-10 mg/hr).  Still if no improvement; hypotension or severe bradycardia, vasopressors and recommended (norepinephrine norepinephrine recommended initially). Dopamine and vasopressin are not recommended. - Will  also obtain lactic acid for assessing for effusion status, continue IV fluid hydration - Placed pacer pads, atropine as needed for heart rate <40.  - serial neurochecks neuro checks, Ativan as needed for any seizures due to lower threshold - Psych consult will be obtained once patient is medically stable   Active Problems: Intentional drug overdose with prior suicide ideation/attempt - Patient has been involuntary committed by ER physician, patient will need psychiatry consult once medically stable    Rheumatoid arthritis(714.0) - Patient is on Enbrel once a week    Major depression, recurrent: Continue pristiq, psych consult once medically stable    POTS (postural orthostatic tachycardia syndrome) - Continue IV fluid hydration, midodrine  DVT prophylaxis: Lovenox   CODE STATUS: Full code   Family Communication:  no family member at the bedside, patient alert and awake and oriented  Admission, patients condition and plan of care including tests being ordered have been discussed with the patient  who indicates understanding and agree with the plan and Code Status   Further plan will depend as patient's clinical course evolves and further radiologic and laboratory data become available.   Time Spent on Admission: One-hour   Liora Myles Jenna Luo M.D. [facility] 08/11/2013, 2:21 PM Pager: [phone]  If 7PM-7AM, please contact night-coverage [email or web address] Password [email or web address]  **Disclaimer: This note was dictated with voice recognition software. Similar sounding words can inadvertently be transcribed and this note may contain transcription errors which may not have been corrected upon publication of note.**

## 2013-08-11 NOTE — ED Notes (Signed)
Per charge RN, Dr. Wilkie Aye has notified poison control.

## 2013-08-12 LAB — COMPREHENSIVE METABOLIC PANEL
ALT: 11 U/L (ref 0–35)
AST: 12 U/L (ref 0–37)
Albumin: 3.1 g/dL — ABNORMAL LOW (ref 3.5–5.2)
Alkaline Phosphatase: 48 U/L (ref 39–117)
BUN: 7 mg/dL (ref 6–23)
CALCIUM: 8.4 mg/dL (ref 8.4–10.5)
CHLORIDE: 115 meq/L — AB (ref 96–112)
CO2: 21 mEq/L (ref 19–32)
Creatinine, Ser: 0.7 mg/dL (ref 0.50–1.10)
GFR calc Af Amer: >90 mL/min (ref >90)
Glucose, Bld: 101 mg/dL — ABNORMAL HIGH (ref 70–99)
Potassium: 4.3 mEq/L (ref 3.7–5.3)
SODIUM: 143 meq/L (ref 137–147)
Total Bilirubin: <0.2 mg/dL — ABNORMAL LOW (ref 0.3–1.2)
Total Protein: 5.4 g/dL — ABNORMAL LOW (ref 6.0–8.3)

## 2013-08-12 LAB — CBC
HCT: 30.2 % — ABNORMAL LOW (ref 36.0–46.0)
HCT: 31.1 % — ABNORMAL LOW (ref 36.0–46.0)
Hemoglobin: 10.5 g/dL — ABNORMAL LOW (ref 12.0–15.0)
Hemoglobin: 10.9 g/dL — ABNORMAL LOW (ref 12.0–15.0)
MCH: 33.8 pg (ref 26.0–34.0)
MCH: 33.5 pg (ref 26.0–34.0)
MCHC: 35.1 g/dL (ref 30.0–36.0)
MCHC: 35 g/dL (ref 30.0–36.0)
MCV: 96.2 fL (ref 78.0–100.0)
MCV: 95.7 fL (ref 78.0–100.0)
Platelets: 124 10*3/uL — ABNORMAL LOW (ref 150–400)
Platelets: 136 10*3/uL — ABNORMAL LOW (ref 150–400)
RBC: 3.14 MIL/uL — AB (ref 3.87–5.11)
RBC: 3.25 MIL/uL — ABNORMAL LOW (ref 3.87–5.11)
RDW: 12.4 % (ref 11.5–15.5)
RDW: 12.3 % (ref 11.5–15.5)
WBC: 4.2 10*3/uL (ref 4.0–10.5)
WBC: 5 10*3/uL (ref 4.0–10.5)

## 2013-08-12 MED ORDER — CILIDINIUM-CHLORDIAZEPOXIDE 2.5-5 MG PO CAPS
1.0000 | ORAL_CAPSULE | ORAL | Status: DC | PRN
  Filled 2013-08-12: qty 1

## 2013-08-12 MED ORDER — CLONAZEPAM 1 MG PO TABS
1.0000 mg | ORAL_TABLET | Freq: Four times a day (QID) | ORAL | Status: DC | PRN
  Administered 2013-08-12 – 2013-08-14 (×7): 1 mg via ORAL
  Filled 2013-08-12 (×3): qty 1
  Filled 2013-08-12: qty 2
  Filled 2013-08-12: qty 1
  Filled 2013-08-12: qty 2
  Filled 2013-08-12 (×2): qty 1

## 2013-08-12 NOTE — Progress Notes (Signed)
[facility] Progress Note   Avriel Kandel Pilch FYB:[id number] DOB: 1965/10/06 DOA: 08/11/2013 PCP: [name], MD  Brief narrative: Jennifer [name] is a 48 y.o. female presenting on 08/11/2013 with  has a past medical history of Headache(784.0); Orthostatic syncope; Arthritis; IBS (irritable bowel syndrome); Dysrhythmia; Depression; Anxiety; Hemochromatosis; Rheumatoid arthritis(714.0) (02/04/2012); and POTS (postural orthostatic tachycardia syndrome) who presents with ingestion of 21 tabs of 60 mg of Inderal at 7 AM on 6/9 in an attempt to commit suicide.    Subjective: No complaints.   Assessment/Plan: Principal Problem:   Suicide attempt by beta blocker overdose - BP improving now- will d/c IVF (was at 150 cc/hr) and follow BP closely - HR still reveals bradycardia in 60s - ambulate pt and monitor for symptoms - psych referral made  Active Problems:   Rheumatoid arthritis - currently stable    Major depression, recurrent - psych consulted- cont home meds    POTS (postural orthostatic tachycardia syndrome) - follow with Dr [name] (cardiology) and has an appt for tomorrow afternoon  Thrombocytopenia- - mild and improved from yesterday- follow    Code Status: Full code Family Communication: with mother and sister Disposition Plan: to be determined  Consultants: none  Procedures: none  Antibiotics: Antibiotics Given (last 72 hours)   None       DVT prophylaxis: Lovenox  Objective: Filed Weights   08/11/13 1942  Weight: 59.2 kg (130 lb 8.2 oz)    Vitals Filed Vitals:   08/12/13 1000 08/12/13 1100 08/12/13 1200 08/12/13 1300  BP: 120/83 123/87 132/94 114/76  Pulse: 70 70 68 66  Temp:   98.4 F (36.9 C)   TempSrc:   Oral   Resp: 13 17 14 17   Height:      Weight:      SpO2: 99% 100% 98% 95%     Intake/Output Summary (Last 24 hours) at 08/12/13 1455 Last data filed at 08/12/13 1324  Gross per 24 hour  Intake 4589.17 ml  Output   3500 ml   Net 1089.17 ml     Exam: General: No acute respiratory distress- psych-- affect appears normal Lungs: Clear to auscultation bilaterally without wheezes or crackles Cardiovascular: Regular rate and rhythm without murmur gallop or rub normal S1 and S2 Abdomen: Nontender, nondistended, soft, bowel sounds positive, no rebound, no ascites, no appreciable mass Extremities: No significant cyanosis, clubbing, or edema bilateral lower extremities  Data Reviewed: Basic Metabolic Panel:  Recent Labs Lab 08/11/13 1155 08/11/13 1817 08/12/13 0317  NA 139 141 143  K 3.9 4.1 4.3  CL 108 111 115*  CO2 17* 22 21  GLUCOSE 94 95 101*  BUN 12 9 7   CREATININE 0.82 0.76 0.70  CALCIUM 9.1 8.1* 8.4   Liver Function Tests:  Recent Labs Lab 08/11/13 1155 08/12/13 0317  AST 18 12  ALT 15 11  ALKPHOS 59 48  BILITOT 0.4 <0.2*  PROT 6.6 5.4*  ALBUMIN 3.8 3.1*   No results found for this basename: LIPASE, AMYLASE,  in the last 168 hours No results found for this basename: AMMONIA,  in the last 168 hours CBC:  Recent Labs Lab 08/11/13 1144 08/12/13 0317 08/12/13 1030  WBC 6.6 4.2 5.0  NEUTROABS 2.3  --   --   HGB 13.1 10.5* 10.9*  HCT 37.8 30.2* 31.1*  MCV 95.5 96.2 95.7  PLT 163 124* 136*   Cardiac Enzymes: No results found for this basename: CKTOTAL, CKMB, CKMBINDEX, TROPONINI,  in the last 168 hours  BNP (last 3 results) No results found for this basename: PROBNP,  in the last 8760 hours CBG: No results found for this basename: GLUCAP,  in the last 168 hours  Recent Results (from the past 240 hour(s))  MRSA PCR SCREENING     Status: None   Collection Time    08/11/13  2:47 PM      Result Value Ref Range Status   MRSA by PCR NEGATIVE  NEGATIVE Final   Comment:            The GeneXpert MRSA Assay (FDA     approved for NASAL specimens     only), is one component of a     comprehensive MRSA colonization     surveillance program. It is not     intended to diagnose MRSA      infection nor to guide or     monitor treatment for     MRSA infections.     Studies:  Recent x-ray studies have been reviewed in detail by the Attending Physician  Scheduled Meds:  Scheduled Meds: . enoxaparin (LOVENOX) injection  40 mg Subcutaneous Q24H  . midodrine  20 mg Oral QID  . topiramate  300 mg Oral Daily  . venlafaxine XR  150 mg Oral Q breakfast   Continuous Infusions:   Time spent on care of this patient: 35 min   Homar Weinkauf, MD 08/12/2013, 2:55 PM  LOS: 1 day   [facility] Office  [phone] Pager - Text Page per Loretha Stapler   If 7PM-7AM, please contact night-coverage [email or web address]

## 2013-08-13 ENCOUNTER — Ambulatory Visit: Payer: Self-pay | Admitting: Internal Medicine

## 2013-08-13 DIAGNOSIS — I498 Other specified cardiac arrhythmias: Secondary | ICD-10-CM

## 2013-08-13 DIAGNOSIS — T462X1A Poisoning by other antidysrhythmic drugs, accidental (unintentional), initial encounter: Principal | ICD-10-CM

## 2013-08-13 DIAGNOSIS — F332 Major depressive disorder, recurrent severe without psychotic features: Secondary | ICD-10-CM

## 2013-08-13 DIAGNOSIS — X838XXS Intentional self-harm by other specified means, sequela: Secondary | ICD-10-CM

## 2013-08-13 DIAGNOSIS — F331 Major depressive disorder, recurrent, moderate: Secondary | ICD-10-CM

## 2013-08-13 DIAGNOSIS — T50901S Poisoning by unspecified drugs, medicaments and biological substances, accidental (unintentional), sequela: Secondary | ICD-10-CM

## 2013-08-13 LAB — CBC
HEMATOCRIT: 31.1 % — AB (ref 36.0–46.0)
Hemoglobin: 10.6 g/dL — ABNORMAL LOW (ref 12.0–15.0)
MCH: 32.9 pg (ref 26.0–34.0)
MCHC: 34.1 g/dL (ref 30.0–36.0)
MCV: 96.6 fL (ref 78.0–100.0)
Platelets: 117 10*3/uL — ABNORMAL LOW (ref 150–400)
RBC: 3.22 MIL/uL — ABNORMAL LOW (ref 3.87–5.11)
RDW: 12.5 % (ref 11.5–15.5)
WBC: 4 10*3/uL (ref 4.0–10.5)

## 2013-08-13 NOTE — Progress Notes (Addendum)
Clinical Social Work  CSW contacted the following facilities re: placement:  [facility]- available beds. Referral faxed. (Addendum 1525: patient denied due to medical acuity)  [facility]- no available beds  [facility]- available beds. Referral faxed. (Addendum 1525: hospital confirmed that referral was received but has not been reviewed)  [facility]- no available beds  [facility]- available beds. Referral faxed. (Addendum 1525: hospital confirmed that referral was received but has not been reviewed.)  CSW updated attending MD on DC plans. CSW will continue to follow and will assist with placement.  Sindy Messing, [place] [phone]  Addendum 1530: Due to denials by other hospitals, CSW spoke with [facility] Randall Hiss) at [facility]. CSW explained patient's preference for another hospital placement due to previous experience at [facility]. AC understanding and reports he will review information in order to determine if they can accept patient.   Addendum 1540: CSW met with patient and mom at bedside per patient request. Patient is very anxious about going to [facility] and asked that [place] [facility] check with [facility]. [facility] only has a bed available on dual diagnosis floor but is agreeable to review patient if she feels comfortable with that placement. Patient reports she would prefer placement there and is agreeable to that bed if they are able to accept. CSW faxed referral and will continue to follow.  Addendum 5830: [facility] reports they are still reviewing information but no beds available at this time. [facility] reports they will talk with psych MD to determine if patient is medically stable to accept. CSW informed patient, RN, and MD of no available bed at this time but CSW will continue to follow.
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
## 2013-08-13 NOTE — Progress Notes (Signed)
[facility] Progress Note   Jennifer [name] JME:[id number] DOB: 1965-09-21 DOA: 08/11/2013 PCP: [name], MD  Brief narrative: Jennifer [name] is a 48 y.o. female presenting on 08/11/2013 with  has a past medical history of Headache(784.0); Orthostatic syncope; Arthritis; IBS (irritable bowel syndrome); Dysrhythmia; Depression; Anxiety; Hemochromatosis; Rheumatoid arthritis(714.0) (02/04/2012); and POTS (postural orthostatic tachycardia syndrome) who presents with ingestion of 21 tabs of 60 mg of Inderal at 7 AM on 6/9 in an attempt to commit suicide.    Subjective: No complaints. Would like IVC rescinded as she is agreeable to inpatient treatment but does not want to go to the behavioral health unit at [facility] as she has been there in the past and did not benefit from it.   Assessment/Plan: Principal Problem:   Suicide attempt by beta blocker overdose - BP improved off IVF - HR still reveals bradycardia in 60s - ambulated pt- no symptoms - psych recommended inpatient admission- pt is agreeable   Active Problems:   Rheumatoid arthritis - currently stable    Major depression, recurrent - psych consulted- cont home meds    POTS (postural orthostatic tachycardia syndrome) - follow with Dr [name] (cardiology)   Thrombocytopenia- - mild and improved from yesterday- follow    Code Status: Full code Family Communication: with mother and sister Disposition Plan: to be determined  Consultants: none  Procedures: none  Antibiotics: Antibiotics Given (last 72 hours)   None       DVT prophylaxis: Lovenox  Objective: Filed Weights   08/11/13 1942  Weight: 59.2 kg (130 lb 8.2 oz)    Vitals Filed Vitals:   08/12/13 2257 08/13/13 0623 08/13/13 0732 08/13/13 1341  BP: 101/65 80/58 107/71 123/82  Pulse: 62 71 54 60  Temp: 97.9 F (36.6 C) 98.1 F (36.7 C)  98.3 F (36.8 C)  TempSrc: Oral Oral  Oral  Resp: 17 18 18 18   Height:      Weight:      SpO2:  99% 97% 100% 99%     Intake/Output Summary (Last 24 hours) at 08/13/13 1857 Last data filed at 08/13/13 1700  Gross per 24 hour  Intake   1200 ml  Output      3 ml  Net   1197 ml     Exam: General: No acute respiratory distress- psych-- affect appears normal Lungs: Clear to auscultation bilaterally without wheezes or crackles Cardiovascular: Regular rate and rhythm without murmur gallop or rub normal S1 and S2 Abdomen: Nontender, nondistended, soft, bowel sounds positive, no rebound, no ascites, no appreciable mass Extremities: No significant cyanosis, clubbing, or edema bilateral lower extremities  Data Reviewed: Basic Metabolic Panel:  Recent Labs Lab 08/11/13 1155 08/11/13 1817 08/12/13 0317  NA 139 141 143  K 3.9 4.1 4.3  CL 108 111 115*  CO2 17* 22 21  GLUCOSE 94 95 101*  BUN 12 9 7   CREATININE 0.82 0.76 0.70  CALCIUM 9.1 8.1* 8.4   Liver Function Tests:  Recent Labs Lab 08/11/13 1155 08/12/13 0317  AST 18 12  ALT 15 11  ALKPHOS 59 48  BILITOT 0.4 <0.2*  PROT 6.6 5.4*  ALBUMIN 3.8 3.1*   No results found for this basename: LIPASE, AMYLASE,  in the last 168 hours No results found for this basename: AMMONIA,  in the last 168 hours CBC:  Recent Labs Lab 08/11/13 1144 08/12/13 0317 08/12/13 1030 08/13/13 0425  WBC 6.6 4.2 5.0 4.0  NEUTROABS 2.3  --   --   --  HGB 13.1 10.5* 10.9* 10.6*  HCT 37.8 30.2* 31.1* 31.1*  MCV 95.5 96.2 95.7 96.6  PLT 163 124* 136* 117*   Cardiac Enzymes: No results found for this basename: CKTOTAL, CKMB, CKMBINDEX, TROPONINI,  in the last 168 hours BNP (last 3 results) No results found for this basename: PROBNP,  in the last 8760 hours CBG: No results found for this basename: GLUCAP,  in the last 168 hours  Recent Results (from the past 240 hour(s))  MRSA PCR SCREENING     Status: None   Collection Time    08/11/13  2:47 PM      Result Value Ref Range Status   MRSA by PCR NEGATIVE  NEGATIVE Final   Comment:             The GeneXpert MRSA Assay (FDA     approved for NASAL specimens     only), is one component of a     comprehensive MRSA colonization     surveillance program. It is not     intended to diagnose MRSA     infection nor to guide or     monitor treatment for     MRSA infections.     Studies:  Recent x-ray studies have been reviewed in detail by the Attending Physician  Scheduled Meds:  Scheduled Meds: . enoxaparin (LOVENOX) injection  40 mg Subcutaneous Q24H  . midodrine  20 mg Oral QID  . topiramate  300 mg Oral Daily  . venlafaxine XR  150 mg Oral Q breakfast   Continuous Infusions:   Time spent on care of this patient: 35 min   Dixie Jafri, MD 08/13/2013, 6:57 PM  LOS: 2 days   [facility] Office  [phone] Pager - Text Page per Loretha Stapler   If 7PM-7AM, please contact night-coverage [email or web address]

## 2013-08-13 NOTE — Progress Notes (Signed)
Nutrition Brief Note  Patient identified on the Malnutrition Screening Tool (MST) Report  Wt Readings from Last 15 Encounters:  08/11/13 130 lb 8.2 oz (59.2 kg)  07/24/13 130 lb (58.968 kg)  05/08/13 131 lb 14.4 oz (59.829 kg)  03/10/13 135 lb 14.4 oz (61.644 kg)  01/20/13 132 lb 12.8 oz (60.238 kg)  12/04/12 132 lb (59.875 kg)  10/29/12 134 lb 6.4 oz (60.963 kg)  10/21/12 137 lb (62.143 kg)  10/19/12 139 lb (63.05 kg)  07/21/12 134 lb (60.782 kg)  02/04/12 135 lb 12.8 oz (61.598 kg)  10/19/10 141 lb (63.957 kg)  07/27/09 171 lb (77.565 kg)  04/07/09 176 lb (79.833 kg)  09/25/07 156 lb 2.1 oz (70.821 kg)    Body mass index is 20.44 kg/(m^2). Patient meets criteria for normal weight based on current BMI.   Current diet order is regular, patient is consuming approximately 100% of meals at this time. Labs and medications reviewed.   Patient reports good intake currently but poor appetite and intake at home.  Has been drinking [employer] and can order here.  States that she is being transferred today to a behavioral health facility.    Educated patient on good nutrition, the need for 3 meals plus snacks and [employer] Breakfast Essentials daily to improve nutrition and weight status.  No nutrition interventions warranted at this time. If nutrition issues arise, please consult RD.   Oran Rein, RD, LDN Clinical Inpatient Dietitian Pager:  [phone] Weekend and after hours pager:  [phone]
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
## 2013-08-13 NOTE — Treatment Plan (Signed)
Dr. Elsie Saas feels that pt would be appropriate for [facility] and recommends IVC for pt if she does not agree to sign in voluntarily.

## 2013-08-14 DIAGNOSIS — F339 Major depressive disorder, recurrent, unspecified: Secondary | ICD-10-CM

## 2013-08-14 DIAGNOSIS — I951 Orthostatic hypotension: Secondary | ICD-10-CM

## 2013-08-14 LAB — CBC
HEMATOCRIT: 33.3 % — AB (ref 36.0–46.0)
Hemoglobin: 11.1 g/dL — ABNORMAL LOW (ref 12.0–15.0)
MCH: 32.1 pg (ref 26.0–34.0)
MCHC: 33.3 g/dL (ref 30.0–36.0)
MCV: 96.2 fL (ref 78.0–100.0)
Platelets: 119 10*3/uL — ABNORMAL LOW (ref 150–400)
RBC: 3.46 MIL/uL — ABNORMAL LOW (ref 3.87–5.11)
RDW: 12.4 % (ref 11.5–15.5)
WBC: 5.1 10*3/uL (ref 4.0–10.5)

## 2013-08-14 MED ORDER — PROPRANOLOL HCL 10 MG PO TABS
10.0000 mg | ORAL_TABLET | Freq: Once | ORAL | Status: AC
  Administered 2013-08-14: 10 mg via ORAL
  Filled 2013-08-14: qty 1

## 2013-08-14 MED ORDER — ETANERCEPT 50 MG/ML ~~LOC~~ SOAJ
50.0000 mg | SUBCUTANEOUS | Status: DC
  Administered 2013-08-14: 50 mg via SUBCUTANEOUS

## 2013-08-14 NOTE — Progress Notes (Signed)
Writer received call back from [facility]. Spoke with Corrie Dandy and was transferred to admission nurse Jonny Ruiz. Report given to John.

## 2013-08-14 NOTE — Discharge Summary (Signed)
Physician Discharge Summary  LASHAWNTA [name] QHU:[id number] DOB: Feb 21, 1966 DOA: 08/11/2013  PCP: [name], MD  Admit date: 08/11/2013 Discharge date: 08/14/2013  Time spent: >35 minutes  Recommendations for Outpatient Follow-up:  1. Need to f/u with Dr [name]  Discharge Diagnoses:  Principal Problem:   Suicide attempt by beta blocker overdose Active Problems:   Rheumatoid arthritis(714.0)   Major depression, recurrent   POTS (postural orthostatic tachycardia syndrome)   Drug overdose, intentional   Discharge Condition: stable   Diet recommendation: regular diet  Filed Weights   08/11/13 1942  Weight: 59.2 kg (130 lb 8.2 oz)    History of present illness:  Patient is a 48 year old female with multiple medical problems including rheumatoid arthritis, fibromyalgia, depression, anxiety, postural hypotension (POTS on midodrine, propranolol) presented to the ER with intentional overdose today on propranolol. History was obtained from the patient who reported that she had argument with her older son who lives with his father (divorced from her) as she missed his graduation on the weekend (Sunday) due to not feeling well that day. Patient reports that after the argument she did not have back from him, hence she became very depressed and took 21 tablets of 60 mg extended release propranolol at 7 AM today. She states that she had only taken one tablet of clonazepam at 5:30 in the morning and did not take any other medications. She has one prior suicide attempt but did not require hospitalization. Patient feels overwhelmed with her situation, lost her job, house, older son decided to live with her ex-husband, now living in her parent's house. She has another 18 year old and 6-year-old sons.    Hospital Course:  Principal Problem:  Suicide attempt by beta blocker overdose  - required admission to Step down unit due to significant hypotension after admission - psych recommended  inpatient admission for overdose- pt is agreeable   Active Problems:  POTS (postural orthostatic tachycardia syndrome) - BP improved off IVF- HR going upto 130 on standing and walking- will give a dose of her PRN Inderal 10mg  prior to discharge - follows with Dr (cardiology)   Rheumatoid arthritis  - currently stable - will give her weekly dose of Enbrel today  Major depression, recurrent  - psych consulted- cont home meds   Thrombocytopenia-  - mild- follow at outpt  Procedures:  none  Consultations:  psych  Discharge Exam: Filed Vitals:   08/14/13 1135  BP: 102/72  Pulse: 67  Temp: 97.9 F (36.6 C)  Resp: 18    General: AAO x 3, no distress Cardiovascular: RRR, no murmurs Respiratory: CTA b/l   Discharge Instructions You were cared for by a hospitalist during your hospital stay. If you have any questions about your discharge medications or the care you received while you were in the hospital after you are discharged, you can call the unit and asked to speak with the hospitalist on call if the hospitalist that took care of you is not available. Once you are discharged, your primary care physician will handle any further medical issues. Please note that NO REFILLS for any discharge medications will be authorized once you are discharged, as it is imperative that you return to your primary care physician (or establish a relationship with a primary care physician if you do not have one) for your aftercare needs so that they can reassess your need for medications and monitor your lab values.      Discharge Instructions   Increase activity slowly  Complete by:  As directed             Medication List         clonazePAM 1 MG tablet  Commonly known as:  KLONOPIN  Take 1 mg by mouth every 6 (six) hours as needed for anxiety.     desvenlafaxine 100 MG 24 hr tablet  Commonly known as:  PRISTIQ  Take 100 mg by mouth every morning.     ENBREL 50 MG/ML  injection  Generic drug:  etanercept  Inject 50 mg into the skin once a week.     LIBRAX 5-2.5 MG per capsule  Generic drug:  clidinium-chlordiazePOXIDE  Take 1 capsule by mouth as needed (for IBS symptoms).     midodrine 10 MG tablet  Commonly known as:  PROAMATINE  Take 2 tablets (20 mg total) by mouth 4 (four) times daily.     ondansetron 8 MG disintegrating tablet  Commonly known as:  ZOFRAN-ODT  Take 8 mg by mouth daily as needed for nausea or vomiting. Inside cheek     propranolol 10 MG tablet  Commonly known as:  INDERAL  Take 10 mg by mouth daily as needed (for increased Heart rate).     ramelteon 8 MG tablet  Commonly known as:  ROZEREM  Take 8 mg by mouth at bedtime as needed for sleep.     topiramate 100 MG tablet  Commonly known as:  TOPAMAX  Take 3 tablets (300 mg total) by mouth daily. For mood stabilization       Allergies  Allergen Reactions  . Florinef [Fludrocortisone] Other (See Comments)    Severe migraine  . Leflunomide Diarrhea  . Methotrexate Derivatives Hives  . Reglan [Metoclopramide]     Jerky   . Remicade [Infliximab] Hives, Diarrhea and Other (See Comments)    Increase heart rate,decrease blood pressure  . Morphine And Related Rash    Rxn to IV morphine; has never had PO morphine  . Sulfonamide Derivatives Rash      The results of significant diagnostics from this hospitalization (including imaging, microbiology, ancillary and laboratory) are listed below for reference.    Significant Diagnostic Studies: No results found.  Microbiology: Recent Results (from the past 240 hour(s))  MRSA PCR SCREENING     Status: None   Collection Time    08/11/13  2:47 PM      Result Value Ref Range Status   MRSA by PCR NEGATIVE  NEGATIVE Final   Comment:            The GeneXpert MRSA Assay (FDA     approved for NASAL specimens     only), is one component of a     comprehensive MRSA colonization     surveillance program. It is not     intended  to diagnose MRSA     infection nor to guide or     monitor treatment for     MRSA infections.     Labs: Basic Metabolic Panel:  Recent Labs Lab 08/11/13 1155 08/11/13 1817 08/12/13 0317  NA 139 141 143  K 3.9 4.1 4.3  CL 108 111 115*  CO2 17* 22 21  GLUCOSE 94 95 101*  BUN 12 9 7   CREATININE 0.82 0.76 0.70  CALCIUM 9.1 8.1* 8.4   Liver Function Tests:  Recent Labs Lab 08/11/13 1155 08/12/13 0317  AST 18 12  ALT 15 11  ALKPHOS 59 48  BILITOT 0.4 <0.2*  PROT 6.6 5.4*  ALBUMIN 3.8 3.1*   No results found for this basename: LIPASE, AMYLASE,  in the last 168 hours No results found for this basename: AMMONIA,  in the last 168 hours CBC:  Recent Labs Lab 08/11/13 1144 08/12/13 0317 08/12/13 1030 08/13/13 0425 08/14/13 0433  WBC 6.6 4.2 5.0 4.0 5.1  NEUTROABS 2.3  --   --   --   --   HGB 13.1 10.5* 10.9* 10.6* 11.1*  HCT 37.8 30.2* 31.1* 31.1* 33.3*  MCV 95.5 96.2 95.7 96.6 96.2  PLT 163 124* 136* 117* 119*   Cardiac Enzymes: No results found for this basename: CKTOTAL, CKMB, CKMBINDEX, TROPONINI,  in the last 168 hours BNP: BNP (last 3 results) No results found for this basename: PROBNP,  in the last 8760 hours CBG: No results found for this basename: GLUCAP,  in the last 168 hours     Signed:  Calvert Cantor, MD  [facility] 08/14/2013, 11:38 AM

## 2013-08-14 NOTE — Progress Notes (Signed)
Pt pick up for transport to [facility] by [facility]. Personal belongings and paperwork given to officer. Pt discharged transported downstairs by staff via wheelchair.

## 2013-08-14 NOTE — Progress Notes (Signed)
Attempted to call report to [facility]. Staff unavailable at this time and will call writer back when available.

## 2013-08-14 NOTE — Progress Notes (Addendum)
Clinical Social Work  Patient has taken medication and RN ready for DC. CSW called sheriff who will transport patient to [facility]. Patient and RN aware and agreeable to plans. RN will call report.  CSW is signing off but available if needed.  [place], [place] [phone]  Addendum 1215 When RN calling report, [facility] requested additional information. CSW explained DC summary was already sent but RN requesting updated VS and Labs. CSW faxed this information as well.

## 2013-08-14 NOTE — Progress Notes (Signed)
Clinical Social Work  Patient accepted to [facility] by Dr. Nadara Mode. RN to call report to [phone]. Patient was inquiring about signing in voluntary and attending MD reports she would rescind IVC if patient is agreeable to sign herself in voluntary. CSW spoke with [facility] who reports they are not agreeable with this plan. [facility] reports patient needs to remain under IVC and she can speak with MD when she arrives at their facility and he will evaluate and determine if he is comfortable with her signing in voluntary. CSW explained this to attending MD and patient who are both agreeable to plans. Patient wants to take Enbrel shot for RA prior to DC. CSW made attending MD aware of this concern and will assist with DC once shot has been given and MD is ready to DC patient.  CSW made [facility] Minerva Areola) at [facility] aware that patient will go to [facility] and no bed at [facility] is needed.  CSW will continue to follow.  [place], [place] [phone]

## 2013-09-14 ENCOUNTER — Ambulatory Visit (INDEPENDENT_AMBULATORY_CARE_PROVIDER_SITE_OTHER): Payer: 59 | Admitting: Physician Assistant

## 2013-09-14 ENCOUNTER — Encounter: Payer: Self-pay | Admitting: Physician Assistant

## 2013-09-14 VITALS — BP 107/73 | HR 64 | Ht 67.0 in | Wt 131.0 lb

## 2013-09-14 DIAGNOSIS — I498 Other specified cardiac arrhythmias: Secondary | ICD-10-CM

## 2013-09-14 DIAGNOSIS — I951 Orthostatic hypotension: Principal | ICD-10-CM

## 2013-09-14 DIAGNOSIS — R079 Chest pain, unspecified: Secondary | ICD-10-CM

## 2013-09-14 DIAGNOSIS — R000 Tachycardia, unspecified: Principal | ICD-10-CM

## 2013-09-14 DIAGNOSIS — G90A Postural orthostatic tachycardia syndrome (POTS): Secondary | ICD-10-CM

## 2013-09-14 NOTE — Progress Notes (Signed)
Cardiology Office Note    Date:  09/14/2013   ID:  Jennifer [name], DOB August 14, 1965, MRN [id number]  PCP:  Jennifer Inch, MD  Cardiologist:  Dr. Sherryl [name]      History of Present Illness: Jennifer [name] is a 48 y.o. female with a history of dysautonomia, hemachromatosis, IBS and chronic diarrhea, rheumatoid arthritis, depression and PFO status post closure 2008.  She has been followed extensively by Dr. Graciela [name] for POTS. He has referred her to [facility] as well as [facility] (Dr. Melene [name]).  She apparently was to see a autonomic neurologists at [facility] as well. She's previously been treated with Florinef and ProAmatine. She had migraine headaches with Florinef. ProAmatine was discontinued secondary to predominant tachycardia during tilt table testing at [facility]. She was last seen by Dr. Graciela [name] 05/2013. He initiated ProAmatine again.  She was to follow up at [facility] as well.  She was apparently placed on Mestinon and this was ultimately discontinued (intolerance - nausea and diarrhea).  Midodrine was then resumed.  She called the office in late April with hypotension and tachycardia. She was sent to the hospital and received IV fluids on several occasions. She was then admitted 6/9-6/12 with suicide attempt by beta blocker overdose.  She was hydrated due to hypotension. She was continued on PRN Propranolol 10 mg.  She was asked to follow up with Dr. Sherryl [name].    She continues to have symptoms of tachycardia and dizziness.  She has recorded her BP as low as 70 systolic after taking Midodrine.  She complains of pruritus assoc with the Midodrine.  She denies syncope.  She notes a lot of chest pain in the last 2 mos.  She describes it with positional changes and with activity.  It feels heavy and sharp.  She sometimes feels short of breath.  She denies orthopnea, PND, edema.     Studies:  - Echo (02/23/09):  EF 55-60%, PFO closure device noted    Recent Labs: 08/12/2013: ALT 11; Creatinine 0.70; Potassium  4.3  08/14/2013: Hemoglobin 11.1*   Wt Readings from Last 3 Encounters:  09/14/13 131 lb (59.421 kg)  08/11/13 130 lb 8.2 oz (59.2 kg)  07/24/13 130 lb (58.968 kg)     Past Medical History  Diagnosis Date  . Headache(784.0)     migraines  . Orthostatic syncope   . Arthritis     inactive RA- no meds  . IBS (irritable bowel syndrome)   . Dysrhythmia     PVCs  . Depression   . Anxiety   . Hemochromatosis   . Rheumatoid arthritis(714.0) 02/04/2012  . POTS (postural orthostatic tachycardia syndrome)     Current Outpatient Prescriptions  Medication Sig Dispense Refill  . clidinium-chlordiazePOXIDE (LIBRAX) 5-2.5 MG per capsule Take 1 capsule by mouth as needed (for IBS symptoms).       . clonazePAM (KLONOPIN) 1 MG tablet Take 1 mg by mouth every 6 (six) hours as needed for anxiety.       Marland Kitchen desvenlafaxine (PRISTIQ) 100 MG 24 hr tablet Take 100 mg by mouth every morning.      . etanercept (ENBREL) 50 MG/ML injection Inject 50 mg into the skin once a week.      . midodrine (PROAMATINE) 10 MG tablet Take 2 tablets (20 mg total) by mouth 4 (four) times daily.  240 tablet  1  . ondansetron (ZOFRAN-ODT) 8 MG disintegrating tablet Take 8 mg by mouth daily as needed for nausea or vomiting. Inside  cheek      . propranolol (INDERAL) 10 MG tablet Take 10 mg by mouth daily as needed (for increased Heart rate).       . ramelteon (ROZEREM) 8 MG tablet Take 8 mg by mouth at bedtime as needed for sleep.      Marland Kitchen topiramate (TOPAMAX) 100 MG tablet Take 3 tablets (300 mg total) by mouth daily. For mood stabilization  30 tablet  0   No current facility-administered medications for this visit.    Allergies:   Florinef; Leflunomide; Methotrexate derivatives; Reglan; Remicade; Morphine and related; and Sulfonamide derivatives   Social History:  The patient  reports that she has never smoked. She has never used smokeless tobacco. She reports that she drinks alcohol.   Family History:  The patient's  family history includes Hemochromatosis in her mother and sister.   ROS:  Please see the history of present illness.   She has some blood in her stools assoc with IBS.    All other systems reviewed and negative.   PHYSICAL EXAM: VS:  BP 107/73  Pulse 64  Ht 5\' 7"  (1.702 m)  Wt 131 lb (59.421 kg)  BMI 20.51 kg/m2 Well nourished, well developed, in no acute distress HEENT: normal Neck: no JVD Vascular:  No carotid bruit Cardiac:  normal S1, S2; RRR; no murmur Lungs:  clear to auscultation bilaterally, no wheezing, rhonchi or rales Abd: soft, nontender, no hepatomegaly Ext: no edema Skin: warm and dry Neuro:  CNs 2-12 intact, no focal abnormalities noted  EKG:  NSR, HR 64, nonspecific ST-T wave changes, no change from prior tracing     ASSESSMENT AND [name]:  1. POTS (postural orthostatic tachycardia syndrome):  She has seen Dr. as well as Dr. Graciela [name] at [facility].  She has tried multiple medications in addition to an abdominal binder.  She is liberal with salt and fluids.  At this point, I am not certain what else to suggest and must defer to Dr. [facility]. I will arrange follow up. 2. Chest pain, unspecified:  She has typical and atypical features.  I suggested an ETT-Echo.  She tells me that she was told she would fall of a treadmill if she ever tried to do this test due to hypotension and tachycardia.  I suggested a Lexiscan Myoview but she wants to avoid radiation.  I am reluctant to recommend a Dobutamine Echo given her problems with tachycardia.  I will review with Dr. Graciela [name]. 3. Disposition:  F/u with Dr. Graciela [name] next available.    Signed, Jennifer [name], MHS 09/14/2013 9:38 AM    [facility] [address] [place], [place], [place]  [place] Phone: [phone]; Fax: [phone]

## 2013-09-14 NOTE — Patient Instructions (Signed)
Your physician recommends that you continue on your current medications as directed. Please refer to the Current Medication list given to you today.  Your physician recommends that you schedule a follow-up appointment NEXT AVAILABLE WITH DR. [name];

## 2013-09-16 ENCOUNTER — Telehealth: Payer: Self-pay | Admitting: Physician Assistant

## 2013-09-16 NOTE — Telephone Encounter (Signed)
Please tell Windle Guard Ellinger I reviewed with Dr. Sherryl Manges.  He suggested to forego stress testing at this time.  We can set her up for a coronary calcium score for risk stratification.  Please see if her insurance will cover this and arrange. Tereso Newcomer, PA-C   09/16/2013 5:02 PM

## 2013-09-17 ENCOUNTER — Telehealth: Payer: Self-pay | Admitting: *Deleted

## 2013-09-17 DIAGNOSIS — R079 Chest pain, unspecified: Secondary | ICD-10-CM

## 2013-09-17 NOTE — Telephone Encounter (Signed)
lmptcb on both home and cell #'s to advise per Dr. [name] and Bing Neighbors. PA to hold off on stress test however to set her up for a CT for coronary calcium score for risk stratification.

## 2013-09-17 NOTE — Telephone Encounter (Signed)
lmptcb on both home and cell #'s to advise per Dr. Klein and Scott W. PA to hold off on stress test however to set her up for a CT for coronary calcium score for risk stratification. 

## 2013-09-18 ENCOUNTER — Ambulatory Visit (INDEPENDENT_AMBULATORY_CARE_PROVIDER_SITE_OTHER): Payer: 59 | Admitting: Internal Medicine

## 2013-09-18 ENCOUNTER — Encounter: Payer: Self-pay | Admitting: Internal Medicine

## 2013-09-18 VITALS — BP 103/66 | HR 67 | Ht 67.0 in | Wt 134.0 lb

## 2013-09-18 DIAGNOSIS — R000 Tachycardia, unspecified: Principal | ICD-10-CM

## 2013-09-18 DIAGNOSIS — G90A Postural orthostatic tachycardia syndrome (POTS): Secondary | ICD-10-CM

## 2013-09-18 DIAGNOSIS — I498 Other specified cardiac arrhythmias: Secondary | ICD-10-CM

## 2013-09-18 DIAGNOSIS — I951 Orthostatic hypotension: Principal | ICD-10-CM

## 2013-09-18 NOTE — Telephone Encounter (Signed)
pt notified about needing to be set up for Cardiac Scoring for risk stratification per Bing Neighbors. PA and Dr. [name]. Order placed today, pt has 3:15 appt w/Dr. [name] today. I will advise Dr. Odessa Fleming RN order in system already.

## 2013-09-18 NOTE — Telephone Encounter (Signed)
lmptcb to advise per Bing Neighbors. PA and Dr. Odessa Fleming recommendation to hold off on stress test and to order a Coronary CT

## 2013-09-18 NOTE — Telephone Encounter (Signed)
pt notified about needing to be set up for Cardiac Scoring for risk stratification per Scott W. PA and Dr. Klein. Order placed today, pt has 3:15 appt w/Dr. Klein today. I will advise Dr. Klein's RN order in system already.  

## 2013-09-18 NOTE — Patient Instructions (Signed)
Your physician recommends that you continue on your current medications as directed. Please refer to the Current Medication list given to you today.  Your physician wants you to follow-up in: 4 months with Dr. Klein. You will receive a reminder letter in the mail two months in advance. If you don't receive a letter, please call our office to schedule the follow-up appointment.  

## 2013-09-18 NOTE — Progress Notes (Signed)
Patient Care Team: [name], MD as PCP - General (Family Medicine) Levert Feinstein, MD as Consulting Physician (Oncology) [facility] Salvia, MD as Consulting Physician (Cardiology)   HPI  Jennifer [name] is a 48 y.o. female Seen in followup for dysautonomia manifested by hypotension and tachy palpitations   She suffers both for hemachromatosis as well as rheumatoid arthritis. She also has primary and secondary anxiety and depression.  She has previously been treated with Florinef and ProAmatine the former she did not tolerate because of migraines the latter was discontinued following tilt table testing at [facility] where she had predominant tachycardia; at that juncture her blood pressures were better and low-dose beta blockers were attempted. She failed to tolerate these.  She is pursuing recumbent exercise bicycle as well as yoga.   There has been significant psychosocial stress.she was recently hospitalized for attempted suicide with beta blocker overdose.  She's been struggling with sleep and worsening depression.  Her mother has suggested that she should not be taking her blood pressure 3 times a day is increased more stress and anxiety  Past Medical History  Diagnosis Date  . Headache(784.0)     migraines  . Orthostatic syncope   . Arthritis     inactive RA- no meds  . IBS (irritable bowel syndrome)   . Dysrhythmia     PVCs  . Depression   . Anxiety   . Hemochromatosis   . Rheumatoid arthritis(714.0) 02/04/2012  . POTS (postural orthostatic tachycardia syndrome)     Past Surgical History  Procedure Laterality Date  . Patent foramen ovale closure  2008  . Cholecystectomy  1989  . Dilation and curettage of uterus  2004  . Laparoscopy    . Laparoscopic tubal ligation  10/26/2010    Procedure: LAPAROSCOPIC TUBAL LIGATION;  Surgeon: Zelphia Cairo;  Location: [facility];  Service: Gynecology;  Laterality: Bilateral;    Current Outpatient Prescriptions    Medication Sig Dispense Refill  . clidinium-chlordiazePOXIDE (LIBRAX) 5-2.5 MG per capsule Take 1 capsule by mouth as needed (for IBS symptoms).       . clonazePAM (KLONOPIN) 1 MG tablet Take 1 mg by mouth every 6 (six) hours as needed for anxiety.       Marland Kitchen desvenlafaxine (PRISTIQ) 100 MG 24 hr tablet Take 100 mg by mouth every morning.      . etanercept (ENBREL) 50 MG/ML injection Inject 50 mg into the skin once a week.      . midodrine (PROAMATINE) 10 MG tablet Take 2 tablets (20 mg total) by mouth 4 (four) times daily.  240 tablet  1  . ondansetron (ZOFRAN-ODT) 8 MG disintegrating tablet Take 8 mg by mouth daily as needed for nausea or vomiting. Inside cheek      . propranolol (INDERAL) 10 MG tablet Take 10 mg by mouth daily as needed (for increased Heart rate).       . ramelteon (ROZEREM) 8 MG tablet Take 8 mg by mouth at bedtime as needed for sleep.      Marland Kitchen topiramate (TOPAMAX) 100 MG tablet Take 3 tablets (300 mg total) by mouth daily. For mood stabilization  30 tablet  0   No current facility-administered medications for this visit.    Allergies  Allergen Reactions  . Florinef [Fludrocortisone] Other (See Comments)    Severe migraine  . Leflunomide Diarrhea  . Methotrexate Derivatives Hives  . Reglan [Metoclopramide]     Jerky   . Remicade [Infliximab]  Hives, Diarrhea and Other (See Comments)    Increase heart rate,decrease blood pressure  . Morphine And Related Rash    Rxn to IV morphine; has never had PO morphine  . Sulfonamide Derivatives Rash    Review of Systems negative except from HPI and PMH  Physical Exam BP 103/66  Pulse 67  Ht 5\' 7"  (1.702 m)  Wt 134 lb (60.782 kg)  BMI 20.98 kg/m2 Well developed and well nourished in no acute distress HENT normal E scleral and icterus clear Neck Supple JVP flat; carotids brisk and full Clear to ausculation  Regular rate and rhythm, no murmurs gallops or rub Soft with active bowel sounds No clubbing cyanosis none  Edema Alert and oriented, grossly normal motor and sensory function Skin Warm and Dry arachnodactyly  ECG sinus rhythm at 91 intervals 13/08/35  Assessment and  Plan  Dysautonomia  Rheumatoid arthritis  Depression-primary and secondary  Hemochromatosis-homozygous  Arachnodactyly     Currently hypotension is her major manifestation Use where he V. abdominal binder; she has had no recurrent syncope. He does have episodes of lightheadedness. Her ProAmatine doses have been varied from 5-right and 20 mg 2-4 times a day. This has been done in conjunction with [facility]  She is to be seen to begin a couple of weeks.  We discussed the importance of recumbent exercise.  We also discussed the value of trying to get sleep and the potential in her fraction between her depression and her sleep disturbance. She is to pursue this with her psychologist and psychiatrist.  I suggested that she left her symptoms determine her pharmacological adjustments as opposed to her blood pressure numbers. She is quite  astute recognizing her symptoms

## 2013-10-05 ENCOUNTER — Telehealth: Payer: Self-pay | Admitting: Internal Medicine

## 2013-10-05 NOTE — Telephone Encounter (Deleted)
error 

## 2013-10-16 ENCOUNTER — Telehealth: Payer: Self-pay | Admitting: Internal Medicine

## 2013-10-16 NOTE — Telephone Encounter (Signed)
New message          Pt would like to come into the office before her allotted time to meet with dr [name] concerning her visit with the [facility] specialist / She also wants to confirm that you received notes from her visit.

## 2013-10-16 NOTE — Telephone Encounter (Signed)
Pt calling Dr Caryl Comes and nurse to inform them of her recent visit with a specialist Dr Moises Blood at [facility], concerning her Pots disease.  Pt states that Dr Caryl Comes referred her to Dr Fatima Sanger.  Pt states she saw Dr Fatima Sanger about 2 weeks ago, and he was to fax his dictation notes over to our office for Dr Caryl Comes to review.  Pt states that the specialist recommended dosage change with her midodrine, but wanted Dr Caryl Comes to address this matter.  Pt asked if we have received records yet. Informed pt that per Medical Records, no records from Dr Fatima Sanger have been faxed yet.  Informed the pt that per our medical records dept, we can send her a request form to her personal email, and she can fill it out accordingly and we will request records from Dr Margart Sickles office to be faxed to our office immediately.  Confirmed pt's personal email address and updated our med records coordinator, Maudie Mercury.  Pt also requesting a f/u ov with Dr Caryl Comes before November to discuss her Pots, because she didn't feel as if her needs were met by Dr Fatima Sanger.  Informed pt that Dr Caryl Comes and nurse are both out of the office today, but will be back next week.  Informed pt that I will forward this message to both of them for further review and recommendation, and follow-up thereafter.  Pt verbalized understanding and agrees with this plan.

## 2013-10-21 ENCOUNTER — Telehealth: Payer: Self-pay | Admitting: Internal Medicine

## 2013-10-21 NOTE — Telephone Encounter (Signed)
New message     Refill propranolol----have 2 tablets left.  Please call it in to [facility]

## 2013-10-21 NOTE — Telephone Encounter (Signed)
Please set up appt as available

## 2013-10-21 NOTE — Telephone Encounter (Signed)
New message     Need to give update from appt with Dr Kennedy Bucker at [facility] regarding midrone dosage.

## 2013-10-22 ENCOUNTER — Other Ambulatory Visit: Payer: Self-pay | Admitting: *Deleted

## 2013-10-22 MED ORDER — PROPRANOLOL HCL 10 MG PO TABS: 10.0000 mg | ORAL_TABLET | Freq: Every day | ORAL | Status: DC | PRN

## 2013-10-22 NOTE — Telephone Encounter (Signed)
Patient tells me that Dr. Kennedy Bucker advised her to take no more than 15-20 mg max daily of Midodrine. He also advised not to take it after 1pm. She describes this adjustment as being hard for her, it was difficult just reducing dose without weaning. She is still experiencing some dizziness. Follow up again in [place] for Dr. Kennedy Bucker. We have arranged for OV with Dr. [name] 9/10. She is agreeable to this.

## 2013-10-28 ENCOUNTER — Ambulatory Visit: Payer: Self-pay | Admitting: Internal Medicine

## 2013-11-03 ENCOUNTER — Telehealth: Payer: Self-pay | Admitting: Internal Medicine

## 2013-11-03 NOTE — Telephone Encounter (Signed)
New message      Pt want to schedule test you talked about.  She has been having chest pain for a month.

## 2013-11-04 ENCOUNTER — Other Ambulatory Visit: Payer: Self-pay | Admitting: *Deleted

## 2013-11-04 MED ORDER — MIDODRINE HCL 10 MG PO TABS: 20.0000 mg | ORAL_TABLET | Freq: Four times a day (QID) | ORAL | Status: DC

## 2013-11-04 NOTE — Telephone Encounter (Signed)
Notified patient, per Dr. [name], will address at office visit next week. She verbalized understanding.  30d refill for Midodrine sent in per pt request.

## 2013-11-11 ENCOUNTER — Other Ambulatory Visit: Payer: Self-pay

## 2013-11-12 ENCOUNTER — Ambulatory Visit: Payer: Self-pay | Admitting: Internal Medicine

## 2013-11-13 ENCOUNTER — Other Ambulatory Visit: Payer: Self-pay

## 2013-11-16 ENCOUNTER — Encounter: Payer: Self-pay | Admitting: Oncology

## 2013-11-18 ENCOUNTER — Encounter: Payer: Self-pay | Admitting: Oncology

## 2013-11-18 NOTE — Progress Notes (Signed)
Patient ID: Jennifer [name], female   DOB: June 14, 1965, 48 y.o.   MRN: [id number] 48 year old woman who I follow for hemochromatosis. She has chronically low ferritin levels and has not required phlebotomy. Most recent ferritin level XXXIV on 03/09/2013. She has normal liver functions. She has developed seronegative rheumatoid arthritis. More recently she has developed idiopathic orthostatic hypotension and has been followed closely by cardiology. She is currently on midodrine and propranolol.. She did not keep her appointment today. She will be rescheduled.

## 2013-11-23 ENCOUNTER — Other Ambulatory Visit: Payer: Self-pay

## 2013-11-23 MED ORDER — MIDODRINE HCL 10 MG PO TABS: 20.0000 mg | ORAL_TABLET | Freq: Four times a day (QID) | ORAL | Status: DC

## 2013-12-17 ENCOUNTER — Ambulatory Visit: Payer: Self-pay | Admitting: Internal Medicine

## 2013-12-18 ENCOUNTER — Other Ambulatory Visit: Payer: Self-pay

## 2013-12-19 ENCOUNTER — Other Ambulatory Visit: Payer: Self-pay | Admitting: Internal Medicine

## 2013-12-21 ENCOUNTER — Encounter: Payer: Self-pay | Admitting: Internal Medicine

## 2014-01-13 ENCOUNTER — Telehealth: Payer: Self-pay | Admitting: *Deleted

## 2014-01-13 NOTE — Telephone Encounter (Signed)
Late Entry -   Cardia CT SCORE   Received: 3 weeks ago    Lavell Luster, RN           Per Hillery Aldo to remove order for Cardiac CT Score- Per patient she wanted to wait til she see the Dr. [name] before scheduling the test. Patient has no showed x 2 for appointments/saf

## 2014-02-08 ENCOUNTER — Ambulatory Visit (INDEPENDENT_AMBULATORY_CARE_PROVIDER_SITE_OTHER): Payer: 59 | Admitting: Internal Medicine

## 2014-02-08 ENCOUNTER — Encounter: Payer: Self-pay | Admitting: Internal Medicine

## 2014-02-08 VITALS — BP 95/72 | HR 85 | Ht 67.0 in | Wt 130.6 lb

## 2014-02-08 DIAGNOSIS — R000 Tachycardia, unspecified: Secondary | ICD-10-CM

## 2014-02-08 DIAGNOSIS — G90A Postural orthostatic tachycardia syndrome (POTS): Secondary | ICD-10-CM

## 2014-02-08 DIAGNOSIS — I951 Orthostatic hypotension: Secondary | ICD-10-CM

## 2014-02-08 MED ORDER — MIDODRINE HCL 5 MG PO TABS: 5.0000 mg | ORAL_TABLET | Freq: Three times a day (TID) | ORAL | Status: DC

## 2014-02-08 NOTE — Patient Instructions (Addendum)
Your physician has recommended you make the following change in your medication:  1) CHANGE Midodrine 5 mg three times daily   Your physician wants you to follow-up in: 4-5 months with Dr. [name].  You will receive a reminder letter in the mail two months in advance. If you don't receive a letter, please call our office to schedule the follow-up appointment.

## 2014-02-08 NOTE — Progress Notes (Signed)
Patient Care Team: [name], MD as PCP - General (Family Medicine) Levert Feinstein, MD as Consulting Physician (Oncology) Duke Salvia, MD as Consulting Physician (Cardiology)   HPI  Jennifer [name] is a 48 y.o. female Seen in followup for dysautonomia manifested by hypotension and tachy palpitations  She cant record her BP at home   She finds herself stumbling around the house, without loss of conscsiousness  She has had no syncope   There has been an intercurrent URI with good fluid intake and no change in her status  She suffers both for hemachromatosis as well as seronegative rheumatoid arthritis. She also has primary and secondary anxiety and depression.  She has previously been treated with Florinef and ProAmatine the former she did not tolerate because of migraines the latter was discontinued following tilt table testing at [facility] where she had predominant tachycardia; at that juncture her blood pressures were better and low-dose beta blockers were attempted. She failed to tolerate these.  She is pursuing recumbent exercise bicycle as well as yoga.   There has been significant psychosocial stress. 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     Past Medical History  Diagnosis Date  . Headache(784.0)     migraines  . Orthostatic syncope   . Arthritis     inactive RA- no meds  . IBS (irritable bowel syndrome)   . Dysrhythmia     PVCs  . Depression   . Anxiety   . Hemochromatosis   . Rheumatoid arthritis(714.0) 02/04/2012  . POTS (postural orthostatic tachycardia syndrome)     Past Surgical History  Procedure Laterality Date  . Patent foramen ovale closure  2008  . Cholecystectomy  1989  . Dilation and curettage of uterus  2004  . Laparoscopy    . Laparoscopic tubal ligation  10/26/2010   Procedure: LAPAROSCOPIC TUBAL LIGATION;  Surgeon: Zelphia Cairo;  Location: [facility];  Service: Gynecology;  Laterality: Bilateral;    Current Outpatient Prescriptions  Medication Sig Dispense Refill  . clidinium-chlordiazePOXIDE (LIBRAX) 5-2.5 MG per capsule Take 1 capsule by mouth as needed (for IBS symptoms).     . clonazePAM (KLONOPIN) 2 MG tablet Take 2 mg by mouth as needed for anxiety.    Marland Kitchen desvenlafaxine (PRISTIQ) 100 MG 24 hr tablet Take 100 mg by mouth every morning.    . etanercept (ENBREL) 50 MG/ML injection Inject 50 mg into the skin once a week.    . ondansetron (ZOFRAN-ODT) 8 MG disintegrating tablet Take 8 mg by mouth daily as needed for nausea or vomiting. Inside cheek    . propranolol (INDERAL) 10 MG tablet TAKE 1 TABLET BY MOUTH DAILY AS NEEDED FOR INCREASED HEART RATE. 30 tablet 1  . topiramate (TOPAMAX) 100 MG tablet Take 3 tablets (300 mg total) by mouth daily. For mood stabilization 30 tablet 0  . zolpidem (AMBIEN) 10 MG tablet Take 10 mg by mouth at bedtime as needed for sleep.     No current facility-administered medications for this visit.    Allergies  Allergen Reactions  . Florinef [Fludrocortisone] Other (See Comments)    Severe migraine  . Leflunomide Diarrhea  . Methotrexate Derivatives Hives  . Reglan [Metoclopramide]     Jerky   . Remicade [Infliximab] Hives, Diarrhea and Other (See Comments)  Increase heart rate,decrease blood pressure  . Morphine And Related Rash    Rxn to IV morphine; has never had PO morphine  . Sulfonamide Derivatives Rash    Review of Systems negative except from HPI and PMH  Physical Exam BP 95/72 mmHg  Pulse 85  Ht 5\' 7"  (1.702 m)  Wt 130 lb 9.6 oz (59.24 kg)  BMI 20.45 kg/m2 Well developed and well nourished in no acute distress HENT normal E scleral and icterus clear Neck Supple JVP flat; carotids brisk and full Clear to ausculation  Regular rate and rhythm, no murmurs gallops or rub Soft with active bowel  sounds No clubbing cyanosis none Edema Alert and oriented, grossly normal motor and sensory function Skin Warm and Dry arachnodactyly  ECG sinus rhythm at 91 intervals 13/08/35  Assessment and  Plan  Dysautonomia  Rheumatoid arthritis  Depression-primary and secondary  Hemochromatosis-homozygous  Arachnodactyly     We will continue to work on fluids and exercise  She will reusme the use of an abdominal binder and low dose proamatine 2.5-5 mg three times a day  If symptoms persist would consider neurological consultation   We spent more than 50% of our >25 min visit in face to face counseling regarding the above

## 2014-03-10 ENCOUNTER — Telehealth: Payer: Self-pay | Admitting: Internal Medicine

## 2014-03-10 NOTE — Telephone Encounter (Signed)
[employer] paper completed By Dr.Klein, scanned in pt called for pick up

## 2014-03-16 ENCOUNTER — Telehealth: Payer: Self-pay | Admitting: Internal Medicine

## 2014-03-16 NOTE — Telephone Encounter (Signed)
Jennifer [name] requesting a call back from [place], [place] when she returns to the office.  He wants to know: 1) if the Injury Illness Status Report was actually received 2) if Dr. [name] will indeed complete it or not   Requesting callback at [phone] Fax # [phone]

## 2014-03-16 NOTE — Telephone Encounter (Signed)
New Message      Jennifer [name] from [facility] [employer] is wanting to know if the 3 page form on Injury Illness Status report. He wanted to know if Dr.Klein has completed it,  Please give Jennifer [name] a call back .

## 2014-03-17 NOTE — Telephone Encounter (Signed)
Routing to medical records to address

## 2014-03-17 NOTE — Telephone Encounter (Signed)
Left detailed message on Sherrine Maples voicemail explaining paperwork received and completed. Also informed him patient was called 1/6 to let her know paperwork was available to pick up. Told him to call back if further needs/questions.

## 2014-03-25 ENCOUNTER — Telehealth: Payer: Self-pay | Admitting: Internal Medicine

## 2014-03-25 NOTE — Telephone Encounter (Signed)
New Msg         Pt states that her employer is stating she is totally disabled and at her last visit with Dr. [name] he did not inform her of this.   Is this true?   Please contact with info.

## 2014-03-25 NOTE — Telephone Encounter (Signed)
Explained to patient that Dr. [name] filled out paperwork they requested. He was filling out how long she could perform activities per their request, but he did not state patient totally disabled. She is requesting a letter that states this (stating she is not totally disabled) I informed her that I would take care of this next week and call her to obtain fax number to send information to.  She is also asking for a copy of this paperwork - I explained that I would review with medical records next week. Patient verbalized understanding and agreeable to plan.

## 2014-04-06 NOTE — Telephone Encounter (Signed)
Patient does not need statement from [place] at this time. She is currently applying for disability. She is aware that paperwork that Dr. [name] filled out does not state she is disabled.  She will call back if she needs anything further. She will stop by and get copy of paperwork from medical records next week.

## 2014-04-07 ENCOUNTER — Other Ambulatory Visit: Payer: Self-pay | Admitting: Oncology

## 2014-04-14 ENCOUNTER — Other Ambulatory Visit: Payer: Self-pay | Admitting: Oncology

## 2014-04-14 DIAGNOSIS — Z862 Personal history of diseases of the blood and blood-forming organs and certain disorders involving the immune mechanism: Secondary | ICD-10-CM

## 2014-04-20 ENCOUNTER — Other Ambulatory Visit: Payer: Self-pay

## 2014-04-22 ENCOUNTER — Telehealth: Payer: Self-pay | Admitting: Internal Medicine

## 2014-04-22 NOTE — Telephone Encounter (Signed)
Advised Dr. [name] will review today.

## 2014-04-22 NOTE — Telephone Encounter (Signed)
03-25-14 letter sent from [employer], asking when she can rtn to work, needs faxed asap

## 2014-04-25 ENCOUNTER — Other Ambulatory Visit: Payer: Self-pay | Admitting: Internal Medicine

## 2014-04-26 ENCOUNTER — Other Ambulatory Visit: Payer: Self-pay

## 2014-04-27 ENCOUNTER — Encounter: Payer: Self-pay | Admitting: Oncology

## 2014-05-03 ENCOUNTER — Other Ambulatory Visit (INDEPENDENT_AMBULATORY_CARE_PROVIDER_SITE_OTHER): Payer: Self-pay

## 2014-05-03 DIAGNOSIS — Z862 Personal history of diseases of the blood and blood-forming organs and certain disorders involving the immune mechanism: Secondary | ICD-10-CM

## 2014-05-04 ENCOUNTER — Other Ambulatory Visit: Payer: Self-pay

## 2014-05-04 LAB — COMPREHENSIVE METABOLIC PANEL
ALT: 13 U/L (ref 0–35)
AST: 15 U/L (ref 0–37)
Albumin: 3.7 g/dL (ref 3.5–5.2)
Alkaline Phosphatase: 59 U/L (ref 39–117)
BUN: 9 mg/dL (ref 6–23)
CO2: 22 meq/L (ref 19–32)
Calcium: 8.4 mg/dL (ref 8.4–10.5)
Chloride: 113 mEq/L — ABNORMAL HIGH (ref 96–112)
Creat: 0.83 mg/dL (ref 0.50–1.10)
Glucose, Bld: 89 mg/dL (ref 70–99)
Potassium: 3.9 mEq/L (ref 3.5–5.3)
Sodium: 142 mEq/L (ref 135–145)
Total Bilirubin: 0.4 mg/dL (ref 0.2–1.2)
Total Protein: 5.8 g/dL — ABNORMAL LOW (ref 6.0–8.3)

## 2014-05-04 LAB — CBC WITH DIFFERENTIAL/PLATELET
Basophils Absolute: 0 10*3/uL (ref 0.0–0.1)
Basophils Relative: 1 % (ref 0–1)
Eosinophils Absolute: 0.2 10*3/uL (ref 0.0–0.7)
Eosinophils Relative: 4 % (ref 0–5)
HCT: 36.3 % (ref 36.0–46.0)
HEMOGLOBIN: 12 g/dL (ref 12.0–15.0)
LYMPHS ABS: 1.7 10*3/uL (ref 0.7–4.0)
LYMPHS PCT: 46 % (ref 12–46)
MCH: 32.7 pg (ref 26.0–34.0)
MCHC: 33.1 g/dL (ref 30.0–36.0)
MCV: 98.9 fL (ref 78.0–100.0)
MONOS PCT: 8 % (ref 3–12)
MPV: 13.7 fL — AB (ref 8.6–12.4)
Monocytes Absolute: 0.3 10*3/uL (ref 0.1–1.0)
Neutro Abs: 1.6 10*3/uL — ABNORMAL LOW (ref 1.7–7.7)
Neutrophils Relative %: 41 % — ABNORMAL LOW (ref 43–77)
Platelets: 159 10*3/uL (ref 150–400)
RBC: 3.67 MIL/uL — AB (ref 3.87–5.11)
RDW: 13.7 % (ref 11.5–15.5)
WBC: 3.8 10*3/uL — ABNORMAL LOW (ref 4.0–10.5)

## 2014-05-04 LAB — FERRITIN: Ferritin: 69 ng/mL (ref 10–291)

## 2014-05-11 ENCOUNTER — Encounter: Payer: Self-pay | Admitting: Oncology

## 2014-08-06 ENCOUNTER — Other Ambulatory Visit: Payer: Self-pay | Admitting: Internal Medicine

## 2014-08-06 NOTE — Telephone Encounter (Signed)
Per note 1.27.15 

## 2014-09-27 ENCOUNTER — Other Ambulatory Visit: Payer: Self-pay

## 2014-09-27 MED ORDER — PROPRANOLOL HCL 10 MG PO TABS: ORAL_TABLET | ORAL | Status: DC

## 2015-03-04 ENCOUNTER — Ambulatory Visit: Payer: Commercial Managed Care - HMO | Admitting: Internal Medicine

## 2015-03-14 ENCOUNTER — Telehealth: Payer: Self-pay

## 2015-03-14 NOTE — Telephone Encounter (Signed)
Received records request [employer], forwarded to [facility] for processing.

## 2015-03-23 ENCOUNTER — Ambulatory Visit: Payer: Commercial Managed Care - HMO | Admitting: Nurse Practitioner

## 2015-04-04 ENCOUNTER — Telehealth: Payer: Self-pay | Admitting: *Deleted

## 2015-04-04 NOTE — Telephone Encounter (Signed)
Received records request [employer] , forwarded to [employer] for processing 04/04/15.

## 2015-04-21 ENCOUNTER — Telehealth: Payer: Self-pay

## 2015-04-21 NOTE — Telephone Encounter (Signed)
Received records request [employer](disability), forwarded to [facility] for processing.

## 2015-05-23 ENCOUNTER — Ambulatory Visit: Payer: Commercial Managed Care - HMO | Admitting: Internal Medicine

## 2015-10-04 ENCOUNTER — Encounter: Payer: Self-pay | Admitting: Nurse Practitioner

## 2015-10-05 ENCOUNTER — Ambulatory Visit: Payer: Commercial Managed Care - HMO | Admitting: Nurse Practitioner

## 2015-10-07 ENCOUNTER — Encounter: Payer: Self-pay | Admitting: Physician Assistant

## 2015-10-07 ENCOUNTER — Ambulatory Visit (INDEPENDENT_AMBULATORY_CARE_PROVIDER_SITE_OTHER): Payer: Commercial Managed Care - HMO | Admitting: Physician Assistant

## 2015-10-07 VITALS — BP 94/72 | HR 80 | Ht 67.0 in | Wt 139.0 lb

## 2015-10-07 DIAGNOSIS — R002 Palpitations: Secondary | ICD-10-CM

## 2015-10-07 DIAGNOSIS — G909 Disorder of the autonomic nervous system, unspecified: Secondary | ICD-10-CM | POA: Diagnosis not present

## 2015-10-07 DIAGNOSIS — G90A Postural orthostatic tachycardia syndrome (POTS): Secondary | ICD-10-CM

## 2015-10-07 DIAGNOSIS — R000 Tachycardia, unspecified: Secondary | ICD-10-CM | POA: Diagnosis not present

## 2015-10-07 DIAGNOSIS — R42 Dizziness and giddiness: Secondary | ICD-10-CM | POA: Diagnosis not present

## 2015-10-07 DIAGNOSIS — I951 Orthostatic hypotension: Secondary | ICD-10-CM

## 2015-10-07 DIAGNOSIS — G901 Familial dysautonomia [Riley-Day]: Secondary | ICD-10-CM

## 2015-10-07 LAB — TSH: TSH: 3.02 m[IU]/L

## 2015-10-07 MED ORDER — TOPIRAMATE 100 MG PO TABS: 200.0000 mg | ORAL_TABLET | Freq: Every day | ORAL | 0 refills | Status: DC

## 2015-10-07 MED ORDER — MIDODRINE HCL 5 MG PO TABS: 2.5000 mg | ORAL_TABLET | Freq: Three times a day (TID) | ORAL | 2 refills | Status: DC

## 2015-10-07 NOTE — Patient Instructions (Signed)
Medication Instructions:   START TAKING MIDODRINE 2.5 MG THREE TIME A DAY   If you need a refill on your cardiac medications before your next appointment, please call your pharmacy.  Labwork: TSH TODAY    Testing/Procedures: Your physician has requested that you have an echocardiogram. Echocardiography is a painless test that uses sound waves to create images of your heart. It provides your doctor with information about the size and shape of your heart and how well your heart's chambers and valves are working. This procedure takes approximately one hour. There are no restrictions for this procedure.  24 HOUR..Your physician has recommended that you wear a holter monitor. Holter monitors are medical devices that record the heart's electrical activity. Doctors most often use these monitors to diagnose arrhythmias. Arrhythmias are problems with the speed or rhythm of the heartbeat. The monitor is a small, portable device. You can wear one while you do your normal daily activities. This is usually used to diagnose what is causing palpitations/syncope (passing out).    Follow-Up: IN 3 WEEKS WITH KLEIN  * IF NOT MESSAGE NURSE TO FIND SPACE.*  Any Other Special Instructions Will Be Listed Below (If Applicable).

## 2015-10-07 NOTE — Progress Notes (Signed)
Cardiology Office Note Date:  10/07/2015  Patient ID:  Jennifer [name], DOB 1965/08/01, MRN [id number] PCP:  [name], MD  Cardiologist:  Dr. [name]   Chief Complaint: palpitations, dizziness  History of Present Illness: Jennifer [name] is a 50 y.o. female with history of POTS, PFO closure, anxiety/depression, hemachromatosis (she states has never requires phlebotomy) as well as seronegative rheumatoid arthritis, fibromyalgia, comes in today to be seen for Dr. [name].  She last saw him quite a while ago 02/08/14, at that time he noted: "She finds herself stumbling around the house, without loss of conscsiousness  She has had no syncope  She has previously been treated with Florinef and ProAmatine the former she did not tolerate because of migraines the latter was discontinued following tilt table testing at [facility] where she had predominant tachycardia; at that juncture her blood pressures were better and low-dose beta blockers were attempted. She failed to tolerate these.  She is pursuing recumbent exercise bicycle as well as yoga."  Recommendation at that time was to resume use of her abdominal binder and proamatine 2.5-5mg  TID.  She returns today feeling she returns today accompanied by her mother, states that in the last few months her postural dizziness and tachycadia have become worse again.  She stopped the Proamatine quite a while ago secondary to scalp itching (same as she had years prior) and improvement in her symptoms and BP.  Sates she had gotten to te point where she was exercising and doing pretty well with better BPs and stopped it.  She has not been using abd binder either.  She gets some chest pain only when lying on her left side, none otherwise.  She has not had syncope, and generally able to control her near syncopal episodes well.  She reports labs done by her RA physician last week and started on K+ and Ca++ supplementation.   Past Medical History:  Diagnosis Date  . Anxiety     . Arthritis    inactive RA- no meds  . Depression   . Dysrhythmia    PVCs  . Headache(784.0)    migraines  . Hemochromatosis   . IBS (irritable bowel syndrome)   . Orthostatic syncope   . POTS (postural orthostatic tachycardia syndrome)   . Rheumatoid arthritis(714.0) 02/04/2012    Past Surgical History:  Procedure Laterality Date  . CHOLECYSTECTOMY  1989  . DILATION AND CURETTAGE OF UTERUS  2004  . LAPAROSCOPIC TUBAL LIGATION  10/26/2010   Procedure: LAPAROSCOPIC TUBAL LIGATION;  Surgeon: Zelphia Cairo;  Location: [facility];  Service: Gynecology;  Laterality: Bilateral;  . LAPAROSCOPY    . PATENT FORAMEN OVALE CLOSURE  2008    Current Outpatient Prescriptions  Medication Sig Dispense Refill  . clonazePAM (KLONOPIN) 2 MG tablet Take 1 mg by mouth as needed for anxiety.     . cyclobenzaprine (FLEXERIL) 10 MG tablet Take 10 mg by mouth at bedtime.    Marland Kitchen desvenlafaxine (PRISTIQ) 100 MG 24 hr tablet Take 100 mg by mouth every morning.    . etanercept (ENBREL) 50 MG/ML injection Inject 50 mg into the skin once a week.    . Methocarbamol (ROBAXIN PO) Take by mouth daily.    . midodrine (PROAMATINE) 5 MG tablet Take 0.5 tablets (2.5 mg total) by mouth 3 (three) times daily. 60 tablet 2  . ondansetron (ZOFRAN-ODT) 8 MG disintegrating tablet Take 8 mg by mouth daily as needed for nausea or vomiting. Inside cheek    .  propranolol (INDERAL) 10 MG tablet TAKE 1 TABLET BY MOUTH DAILY AS NEEDED FOR INCREASE HEART RATE 30 tablet 6  . topiramate (TOPAMAX) 100 MG tablet Take 2 tablets (200 mg total) by mouth daily. For mood stabilization 30 tablet 0   No current facility-administered medications for this visit.     Allergies:   Florinef [fludrocortisone]; Leflunomide; Methotrexate derivatives; Reglan [metoclopramide]; Remicade [infliximab]; Morphine and related; and Sulfonamide derivatives   Social History:  The patient  reports that she has never smoked. She has never used smokeless tobacco.  She reports that she drinks alcohol.   Family History:  The patient's family history includes Hemochromatosis in her mother and sister.  ROS:  Please see the history of present illness.   All other systems are reviewed and otherwise negative.   PHYSICAL EXAM:  VS:  BP 94/72   Pulse 80   Ht 5\' 7"  (1.702 m)   Wt 139 lb (63 kg)   BMI 21.77 kg/m  BMI: Body mass index is 21.77 kg/m. Well nourished, well developed, in no acute distress  HEENT: normocephalic, atraumatic  Neck: no JVD, carotid bruits or masses Cardiac:  RRR; no significant murmurs, no rubs, or gallops Lungs:  clear to auscultation bilaterally, no wheezing, rhonchi or rales  Abd: soft, nontender MS: no deformity or atrophy Ext: no edema  Skin: warm and dry, no rash Neuro:  No gross deficits appreciated Psych: euthymic mood, full affect   EKG:  Done today and reviewed by myself: Supine is SR, 80bpm, APC,   And standing is ST 111bpm  02/23/09: Echocardiogram Study Conclusions Left ventricle: The cavity size was normal. Wall thickness was normal. Systolic function was normal. The estimated ejection fraction was in the range of 55% to 60%. Regional wall motion abnormalities cannot be excluded. Impressions: - PFO closure device noted.  Recent Labs: No results found for requested labs within last 8760 hours.  No results found for requested labs within last 8760 hours.   CrCl cannot be calculated (Patient's most recent lab result is older than the maximum 21 days allowed.).   Wt Readings from Last 3 Encounters:  10/07/15 139 lb (63 kg)  02/08/14 130 lb 9.6 oz (59.2 kg)  09/18/13 134 lb (60.8 kg)     Other studies reviewed: Additional studies/records reviewed today include: summarized above  ASSESSMENT AND PLAN:  1. Dysautonomia, hypotension and POTS     Of late more symptomatic with both low BPs and palpitations     orthostatics today more notable for increased HR to 127, not a significant BP change  though she reports home BP get quite low, 80's     Showers is mentioned as a trigger for more significant symptoms and she is reminded to avoid hot showers and avoid ling showers and/or get a shower chair.      She has self stopped her Proamatine, we will resume 2.5mg  TID and use of the abdominal binder as well      She reports she is very good at managing symptoms with position change and so on, reminded to avoid linger periods of standing      Keep adequately hydrated  She had labs recently and will request the result,s, get TSH today, scheduler her for an echo and 24hour holter to reassess for any change to her known.  2. Atypical and positional chest pain     Sounds musculoskeletal  Disposition: in 3-4 weeks with Dr. 09/20/13, sooner if needed.  Current medicines are reviewed at  length with the patient today.  The patient did not have any concerns regarding medicines.  Judith Blonder, PA-C 10/07/2015 5:54 PM     [facility] [address] [place] [address] [phone] (office)  [phone] (fax)

## 2015-10-11 ENCOUNTER — Telehealth: Payer: Self-pay | Admitting: Internal Medicine

## 2015-10-11 NOTE — Telephone Encounter (Signed)
New message    Patient calling regarding 2 questions   1. Heart rate is high today when standing 189 -175  In that range  2. Wanted the nurse to be aware that disability will be calling - [employer].

## 2015-10-11 NOTE — Telephone Encounter (Signed)
I called to notify the patient of her TSH results ordered by Luster Landsberg, PA. She reports that she took her shower this morning and started to feel dizzy/ lightheaded. When she got out of the shower her HR was 189. She laid down for a few minutes, then got up to dry her hair a bit and her HR was 175. She did not get a BP taken has her monitor will not typically pick up on this when she is having these types of symptoms. She states when she is sitting/ laying she is ok. She took a dose of propranolol earlier today and HR's have been in the 120's with standing. She is getting ready to take another propranolol and midodrine for her BP.  She is reporting some chest tightness today. I have advised her to proceed with propranolol/ midodrine as planned. She was scheduled for a holter today, but cancelled due to some anxiety issues. She would like to r/s this. I advised her I will have scheduling call her and I will call her back on Thursday to follow up on her symptoms. She reports that she is staying hydrated. She is agreeable.

## 2015-10-14 ENCOUNTER — Telehealth: Payer: Self-pay | Admitting: Internal Medicine

## 2015-10-14 NOTE — Telephone Encounter (Signed)
New message      Pt returning nurse call. Please call back.

## 2015-10-14 NOTE — Telephone Encounter (Signed)
Discussed with Dr. [name]. He wants to see the patient in the office on Monday 8/14 around 9:30 am. She is aware and agreeable with coming in. She will be here at 9:15 am to register.

## 2015-10-14 NOTE — Telephone Encounter (Signed)
I called the patient to follow up on her HR's over the last few days. She states that they have primarily been 130-140's with standing, 110 with sitting, and about 75-80 with lying down. She has only been able to obtain an actual BP reading one time- today she was 95-58 after midodrine, but she is usually in the 70-80's systolic. She has been trying to take propranolol at night for her HR only since her BP is low. She is staying hydrated and eating salt.  I advised I will review with Dr. [name] and call her back. She is agreeable.

## 2015-10-14 NOTE — Telephone Encounter (Signed)
Previous encounter open for the same thing. Will close this encounter.  

## 2015-10-17 ENCOUNTER — Ambulatory Visit (INDEPENDENT_AMBULATORY_CARE_PROVIDER_SITE_OTHER): Payer: Commercial Managed Care - HMO | Admitting: Internal Medicine

## 2015-10-17 ENCOUNTER — Encounter: Payer: Self-pay | Admitting: Internal Medicine

## 2015-10-17 VITALS — BP 87/64 | HR 88

## 2015-10-17 DIAGNOSIS — R42 Dizziness and giddiness: Secondary | ICD-10-CM

## 2015-10-17 DIAGNOSIS — R000 Tachycardia, unspecified: Secondary | ICD-10-CM

## 2015-10-17 MED ORDER — PROPRANOLOL HCL 20 MG PO TABS: ORAL_TABLET | ORAL | 2 refills | Status: DC

## 2015-10-17 MED ORDER — MIDODRINE HCL 5 MG PO TABS: ORAL_TABLET | ORAL | 3 refills | Status: DC

## 2015-10-17 NOTE — Patient Instructions (Addendum)
Medication Instructions: Your physician has recommended you make the following change in your medication:   1. Increase Midodrine to 5 mg three times daily at 7:00 am, 11:00 am, and 3:00 pm 2. Increase Propranolol to 20 mg twice daily as needed for increased heart rate   Labwork: NONE ORDERED  Procedures/Testing: NONE ORDERED  Follow-Up: Your physician recommends that you schedule a follow-up appointment in 3 months with Dr. Camie Patience will contact you in regards to scheduling your referral appointment with Dr. Lavona Mound at [facility]   Any Additional Special Instructions Will Be Listed Below (If Applicable).     If you need a refill on your cardiac medications before your next appointment, please call your pharmacy.

## 2015-10-19 ENCOUNTER — Other Ambulatory Visit (HOSPITAL_COMMUNITY): Payer: Self-pay

## 2015-10-19 NOTE — Progress Notes (Signed)
Patient Care Team: [name], MD as PCP - General (Family Medicine) Levert Feinstein, MD as Consulting Physician (Oncology) Duke Salvia, MD as Consulting Physician (Cardiology)   HPI  Jennifer [name] is a 50 y.o. female Seen in followup for dysautonomia manifested by hypotension and tachy palpitations  She cant record her BP at home   She finds herself stumbling around the house, without loss of conscsiousness  She has had no syncope   There has been an intercurrent URI with good fluid intake and no change in her status  She suffers both for hemachromatosis as well as seronegative rheumatoid arthritis. She also has primary and secondary anxiety and depression.  She has previously been treated with Florinef and ProAmatine the former she did not tolerate because of migraines the latter was discontinued following tilt table testing at [facility] where she had predominant tachycardia; at that juncture her blood pressures were better and low-dose beta blockers were attempted. She failed to tolerate these.     There has been significant psychosocial stress *  She tried to return to work but was unsuccessful   ADLs currently a major struggle with upright tachycardia>>150-180s with LH -s showers dressing and walking          Past Medical History:  Diagnosis Date  . Anxiety   . Arthritis    inactive RA- no meds  . Depression   . Dysrhythmia    PVCs  . Headache(784.0)    migraines  . Hemochromatosis   . IBS (irritable bowel syndrome)   . Orthostatic syncope   . POTS (postural orthostatic tachycardia syndrome)   . Rheumatoid arthritis(714.0) 02/04/2012    Past Surgical History:  Procedure Laterality Date  . CHOLECYSTECTOMY  1989  . DILATION AND CURETTAGE OF UTERUS  2004  . LAPAROSCOPIC TUBAL LIGATION  10/26/2010   Procedure: LAPAROSCOPIC TUBAL LIGATION;  Surgeon: Zelphia Cairo;  Location: [facility];  Service: Gynecology;  Laterality: Bilateral;  . LAPAROSCOPY      . PATENT FORAMEN OVALE CLOSURE  2008    Current Outpatient Prescriptions  Medication Sig Dispense Refill  . clonazePAM (KLONOPIN) 2 MG tablet Take 1 mg by mouth as needed for anxiety.     . cyclobenzaprine (FLEXERIL) 10 MG tablet Take 10 mg by mouth at bedtime.    Marland Kitchen desvenlafaxine (PRISTIQ) 100 MG 24 hr tablet Take 100 mg by mouth every morning.    . etanercept (ENBREL) 50 MG/ML injection Inject 50 mg into the skin once a week.    . Methocarbamol (ROBAXIN PO) Take by mouth daily.    . midodrine (PROAMATINE) 5 MG tablet Take one tablet by mouth 3 times daily @ 7:00 am, 11:00 am and 3:00 pm 270 tablet 3  . ondansetron (ZOFRAN-ODT) 8 MG disintegrating tablet Take 8 mg by mouth daily as needed for nausea or vomiting. Inside cheek    . propranolol (INDERAL) 20 MG tablet TAKE 1 TABLET BY MOUTH TWICE DAILY AS NEEDED FOR INCREASE HEART RATE 180 tablet 2  . topiramate (TOPAMAX) 100 MG tablet Take 2 tablets (200 mg total) by mouth daily. For mood stabilization 30 tablet 0   No current facility-administered medications for this visit.     Allergies  Allergen Reactions  . Florinef [Fludrocortisone] Other (See Comments)    Severe migraine  . Leflunomide Diarrhea  . Methotrexate Derivatives Hives  . Reglan [Metoclopramide]     Jerky   . Remicade [Infliximab] Hives, Diarrhea and Other (See  Comments)    Increase heart rate,decrease blood pressure  . Morphine And Related Rash    Rxn to IV morphine; has never had PO morphine  . Sulfonamide Derivatives Rash    Review of Systems negative except from HPI and PMH  Physical Exam BP (!) 87/64   Pulse 88  Well developed and well nourished in no acute distress HENT normal E scleral and icterus clear Neck Supple JVP flat; carotids brisk and full Clear to ausculation  Regular rate and rhythm, no murmurs gallops or rub Soft with active bowel sounds No clubbing cyanosis none Edema Alert and oriented, grossly normal motor and sensory function Skin  Warm and Dry arachnodactyly  ECG sinus rhythm at 88  Assessment and  Plan  Dysautonomia with hypotension and tachycardia  Rheumatoid arthritis  Depression-primary and secondary  Hemochromatosis-homozygous  Arachnodactyly  Symptoms  Considerably worse in the context of psychosocial stress and summer.  May also represent the vagaries of the disease process.  Will look for other contributors, anemia ,TSH etc   Thankfully she has been able to eat  We will continue to work on fluids and exercise  She will resume use of an abdominal binder and increase proamatine;  She will use low dose inderal for protracted and problematic tachycardia, aware that it can lwer BP  It may be worth considering northera at low dose or repeat trial of florinef previously not tolerated    We spent more than 50% of our >50  min visit in face to face counseling regarding the above

## 2015-10-20 ENCOUNTER — Other Ambulatory Visit (HOSPITAL_COMMUNITY): Payer: Self-pay

## 2015-10-25 ENCOUNTER — Telehealth: Payer: Self-pay | Admitting: Internal Medicine

## 2015-10-25 NOTE — Telephone Encounter (Signed)
Jennifer [name] is calling about a referral to Dr. Lavona Mound at [facility] and was following up on . Has not heard anything . Please call

## 2015-10-28 ENCOUNTER — Ambulatory Visit: Payer: Self-pay | Admitting: Internal Medicine

## 2015-10-31 ENCOUNTER — Other Ambulatory Visit: Payer: Self-pay

## 2015-10-31 ENCOUNTER — Ambulatory Visit (INDEPENDENT_AMBULATORY_CARE_PROVIDER_SITE_OTHER): Payer: Commercial Managed Care - HMO

## 2015-10-31 ENCOUNTER — Ambulatory Visit (HOSPITAL_COMMUNITY): Payer: Commercial Managed Care - HMO | Attending: Cardiology

## 2015-10-31 DIAGNOSIS — R55 Syncope and collapse: Secondary | ICD-10-CM | POA: Insufficient documentation

## 2015-10-31 DIAGNOSIS — R002 Palpitations: Secondary | ICD-10-CM | POA: Diagnosis not present

## 2015-11-01 NOTE — Telephone Encounter (Signed)
I spoke with the patient today and advised her that her referral paperwork for Dr. Lavona Mound was faxed on Friday- confirmation received- and that she should be expecting a call from Dr. Dewayne Shorter office. She voices understanding.

## 2015-11-03 ENCOUNTER — Encounter: Payer: Self-pay | Admitting: Internal Medicine

## 2015-11-08 ENCOUNTER — Telehealth: Payer: Self-pay

## 2015-11-08 NOTE — Telephone Encounter (Signed)
Pt is aware of results. Pt had a question about holter monitor results. I told her that it was not mentioned in these results. But I would forwards questions to Dr. [name]. Pt was agreeable and understood.

## 2015-11-18 ENCOUNTER — Telehealth: Payer: Self-pay | Admitting: Internal Medicine

## 2015-11-18 NOTE — Telephone Encounter (Signed)
Holter results reviewed with the patient. She is still having problems with her HR's going up to the 140-150 range with standing. She is taking propranolol 3-4 times a week. BP ~96/45 while sitting- cannot be obtained while standing.  She does feel that stress is a trigger for her. She took clonazepam 1 mg today when her HR went up to the 150's and this did not help to bring this down.  She took midodrine/ propranolol and she can feel her HR's are settling down.  I advised I will need to review with Dr. [name] next week and call her back. She is agreeable.

## 2015-11-18 NOTE — Telephone Encounter (Signed)
New Message  Pt call requesting to speak with RN about results from Holter Monitor.   Pt also states she has been having issues with her heart rate. Pt states she take the Propranolol 20mg  to regulate her heart rate. Pt states med last 18-24 hours after heart rate speeds back up. Pt would like to discuss with RN. Please call back to advise.

## 2015-11-24 ENCOUNTER — Observation Stay (HOSPITAL_COMMUNITY)
Admission: EM | Admit: 2015-11-24 | Discharge: 2015-11-25 | Disposition: A | Discharge status: Home / Self Care | Payer: Commercial Managed Care - HMO | Attending: Cardiovascular Disease | Admitting: Cardiovascular Disease

## 2015-11-24 ENCOUNTER — Emergency Department (HOSPITAL_COMMUNITY): Payer: Commercial Managed Care - HMO

## 2015-11-24 ENCOUNTER — Encounter (HOSPITAL_COMMUNITY): Payer: Self-pay | Admitting: Emergency Medicine

## 2015-11-24 DIAGNOSIS — I493 Ventricular premature depolarization: Secondary | ICD-10-CM | POA: Insufficient documentation

## 2015-11-24 DIAGNOSIS — Z885 Allergy status to narcotic agent status: Secondary | ICD-10-CM | POA: Diagnosis not present

## 2015-11-24 DIAGNOSIS — M069 Rheumatoid arthritis, unspecified: Secondary | ICD-10-CM | POA: Diagnosis not present

## 2015-11-24 DIAGNOSIS — R002 Palpitations: Secondary | ICD-10-CM | POA: Diagnosis not present

## 2015-11-24 DIAGNOSIS — Z888 Allergy status to other drugs, medicaments and biological substances status: Secondary | ICD-10-CM | POA: Diagnosis not present

## 2015-11-24 DIAGNOSIS — F411 Generalized anxiety disorder: Secondary | ICD-10-CM | POA: Insufficient documentation

## 2015-11-24 DIAGNOSIS — G901 Familial dysautonomia [Riley-Day]: Secondary | ICD-10-CM | POA: Diagnosis not present

## 2015-11-24 DIAGNOSIS — F410 Panic disorder [episodic paroxysmal anxiety] without agoraphobia: Secondary | ICD-10-CM | POA: Insufficient documentation

## 2015-11-24 DIAGNOSIS — Z882 Allergy status to sulfonamides status: Secondary | ICD-10-CM | POA: Diagnosis not present

## 2015-11-24 DIAGNOSIS — I471 Supraventricular tachycardia, unspecified: Secondary | ICD-10-CM | POA: Diagnosis present

## 2015-11-24 DIAGNOSIS — F329 Major depressive disorder, single episode, unspecified: Secondary | ICD-10-CM | POA: Insufficient documentation

## 2015-11-24 DIAGNOSIS — K589 Irritable bowel syndrome without diarrhea: Secondary | ICD-10-CM | POA: Insufficient documentation

## 2015-11-24 DIAGNOSIS — Z9851 Tubal ligation status: Secondary | ICD-10-CM | POA: Insufficient documentation

## 2015-11-24 LAB — BASIC METABOLIC PANEL
ANION GAP: 4 — AB (ref 5–15)
CALCIUM: 8.6 mg/dL — AB (ref 8.9–10.3)
CO2: 25 mmol/L (ref 22–32)
Chloride: 106 mmol/L (ref 101–111)
Creatinine, Ser: 0.99 mg/dL (ref 0.44–1.00)
GFR calc Af Amer: >60 mL/min (ref >60)
GLUCOSE: 90 mg/dL (ref 65–99)
Potassium: 3.7 mmol/L (ref 3.5–5.1)
SODIUM: 135 mmol/L (ref 135–145)

## 2015-11-24 LAB — CBC
HCT: 37.4 % (ref 36.0–46.0)
Hemoglobin: 12.7 g/dL (ref 12.0–15.0)
MCH: 33.6 pg (ref 26.0–34.0)
MCHC: 34 g/dL (ref 30.0–36.0)
MCV: 98.9 fL (ref 78.0–100.0)
Platelets: 163 10*3/uL (ref 150–400)
RBC: 3.78 MIL/uL — ABNORMAL LOW (ref 3.87–5.11)
RDW: 12.2 % (ref 11.5–15.5)
WBC: 4.1 10*3/uL (ref 4.0–10.5)

## 2015-11-24 LAB — I-STAT TROPONIN, ED: TROPONIN I, POC: 0.11 ng/mL — AB (ref 0.00–0.08)

## 2015-11-24 LAB — TROPONIN I: Troponin I: 0.1 ng/mL (ref <0.03)

## 2015-11-24 NOTE — ED Triage Notes (Signed)
Pt arrives to [facility] at this time via [facility] with a complaint of chest pain. Pt reports a history of tachycardia and EMS states that pt was experiencing tachycardia upon there arrival.  Pt's heart rate is noted to be 80 bpm at this time.  Pt denies having chest pain but reports that she feels a heaviness in her chest.   Chief Complaint  Patient presents with  . Chest Pain   Past Medical History:  Diagnosis Date  . Anxiety   . Arthritis    inactive RA- no meds  . ASD (atrial septal defect)    Closure   . Depression   . Dysrhythmia    PVCs  . Headache(784.0)    migraines  . Hemochromatosis   . IBS (irritable bowel syndrome)   . Orthostatic syncope   . POTS (postural orthostatic tachycardia syndrome)   . Rheumatoid arthritis(714.0) 02/04/2012

## 2015-11-24 NOTE — ED Provider Notes (Signed)
[facility]-EMERGENCY DEPT Provider Note   CSN: [id number] Arrival date & time: 11/24/15  2133     History   Chief Complaint Chief Complaint  Patient presents with  . Chest Pain    HPI Jennifer [name] is a 50 y.o. female who presents with palpitations and chest pain. PMH significant for POT syndrome, hx of ASD repair, hx of PVCs and PACs, anxiety. She states that she has tachycardia everyday which is worse when she is upright and better when she lies down. She takes Propranolol prn. She states over the past few days she has had malaise, lightheadedness, nausea. Today she had an acute episode when her HR was high at ~7:45PM so she took a Propranolol and went to lie down which usually makes it better but this time it did not. She measured her pulse with her phone which was in the 200s. After he HR didn't go down after 30 min she called EMS and they did an EKG which showed she was in "A.fib" with a high rate. On review of EKG it appears to be SVT with a rate of ~200. Currently she endorses chest pressure which radiates to the right jaw and subjective SOB. She denies fever, chills, abdominal pain, N/V. She states her palpitations get worse when she is stressed and endorses more stress than usual. She is followed by Dr. Sherryl Manges with Cardiology - last seen on 8/14. Echo on 8/28 was normal. Holter monitor on 8/28 shows HR range of 56-145 with mean as 79 with multiple PACs.   HPI  Past Medical History:  Diagnosis Date  . Anxiety   . Arthritis    inactive RA- no meds  . ASD (atrial septal defect)    Closure   . Depression   . Dysrhythmia    PVCs  . Headache(784.0)    migraines  . Hemochromatosis   . IBS (irritable bowel syndrome)   . Orthostatic syncope   . POTS (postural orthostatic tachycardia syndrome)   . Rheumatoid arthritis(714.0) 02/04/2012    Patient Active Problem List   Diagnosis Date Noted  . Suicide attempt by beta blocker overdose (HCC) 08/11/2013  . POTS (postural  orthostatic tachycardia syndrome) 08/11/2013  . Drug overdose, intentional (HCC) 08/11/2013  . Fibromyalgia 10/19/2012  . Restless legs syndrome (RLS) 10/19/2012  . Insomnia 10/19/2012  . Orthostatic syncope/hypotension 10/17/2012  . Major depression, recurrent (HCC) 07/22/2012  . GAD (generalized anxiety disorder) 07/22/2012  . Panic attacks 07/22/2012  . Rheumatoid arthritis(714.0) 02/04/2012  . DYSAUTONOMIA 04/07/2009  . HEMOCHROMATOSIS, HX OF 04/07/2009  . Irritable bowel syndrome 09/22/2007  . NAUSEA 09/22/2007  . ABDOMINAL PAIN, LEFT LOWER QUADRANT 09/22/2007  . MIGRAINES, HX OF 09/22/2007    Past Surgical History:  Procedure Laterality Date  . CHOLECYSTECTOMY  1989  . DILATION AND CURETTAGE OF UTERUS  2004  . LAPAROSCOPIC TUBAL LIGATION  10/26/2010   Procedure: LAPAROSCOPIC TUBAL LIGATION;  Surgeon: Zelphia Cairo;  Location: [facility];  Service: Gynecology;  Laterality: Bilateral;  . LAPAROSCOPY    . PATENT FORAMEN OVALE CLOSURE  2008    OB History    No data available       Home Medications    Prior to Admission medications   Medication Sig Start Date End Date Taking? Authorizing Provider  clonazePAM (KLONOPIN) 2 MG tablet Take 1 mg by mouth as needed for anxiety.     Historical Provider, MD  cyclobenzaprine (FLEXERIL) 10 MG tablet Take 10 mg by mouth at bedtime.  Historical Provider, MD  desvenlafaxine (PRISTIQ) 100 MG 24 hr tablet Take 100 mg by mouth every morning.    Historical Provider, MD  etanercept (ENBREL) 50 MG/ML injection Inject 50 mg into the skin once a week.    Historical Provider, MD  Methocarbamol (ROBAXIN PO) Take by mouth daily.    Historical Provider, MD  midodrine (PROAMATINE) 5 MG tablet Take one tablet by mouth 3 times daily @ 7:00 am, 11:00 am and 3:00 pm 10/17/15   Duke Salvia, MD  ondansetron (ZOFRAN-ODT) 8 MG disintegrating tablet Take 8 mg by mouth daily as needed for nausea or vomiting. Inside cheek    Historical Provider, MD    propranolol (INDERAL) 20 MG tablet TAKE 1 TABLET BY MOUTH TWICE DAILY AS NEEDED FOR INCREASE HEART RATE 10/17/15   Duke Salvia, MD  topiramate (TOPAMAX) 100 MG tablet Take 2 tablets (200 mg total) by mouth daily. For mood stabilization 10/07/15   Duke Salvia, MD    Family History Family History  Problem Relation Age of Onset  . Hemochromatosis Mother   . Hemochromatosis Sister     Social History Social History  Substance Use Topics  . Smoking status: Never Smoker  . Smokeless tobacco: Never Used  . Alcohol use Yes     Comment: glass wine per month     Allergies   Florinef [fludrocortisone]; Leflunomide; Methotrexate derivatives; Reglan [metoclopramide]; Remicade [infliximab]; Morphine and related; and Sulfonamide derivatives   Review of Systems Review of Systems  Constitutional: Negative for chills, diaphoresis and fever.  Respiratory: Positive for shortness of breath. Negative for cough.   Cardiovascular: Positive for chest pain and palpitations. Negative for leg swelling.  Gastrointestinal: Negative for abdominal pain, nausea and vomiting.  All other systems reviewed and are negative.    Physical Exam Updated Vital Signs BP 93/73 (BP Location: Right Arm)   Pulse 80   Temp 98.6 F (37 C) (Oral)   Resp 16   Ht 5\' 7"  (1.702 m)   Wt 61.2 kg   SpO2 98%   BMI 21.14 kg/m   Physical Exam  Constitutional: She is oriented to person, place, and time. She appears well-developed and well-nourished. No distress.  HENT:  Head: Normocephalic and atraumatic.  Eyes: Conjunctivae are normal. Pupils are equal, round, and reactive to light. Right eye exhibits no discharge. Left eye exhibits no discharge. No scleral icterus.  Neck: Normal range of motion. Neck supple.  Cardiovascular: Normal rate and regular rhythm.  Exam reveals no gallop and no friction rub.   No murmur heard. Pulmonary/Chest: Effort normal and breath sounds normal. No respiratory distress. She has no  wheezes. She has no rales. She exhibits no tenderness.  Abdominal: Soft. She exhibits no distension. There is no tenderness.  Musculoskeletal: She exhibits no edema.  Neurological: She is alert and oriented to person, place, and time.  Skin: Skin is warm and dry.  Psychiatric: Her behavior is normal. Her mood appears anxious.  Nursing note and vitals reviewed.    ED Treatments / Results  Labs (all labs ordered are listed, but only abnormal results are displayed) Labs Reviewed  BASIC METABOLIC PANEL - Abnormal; Notable for the following:       Result Value   BUN <5 (*)    Calcium 8.6 (*)    Anion gap 4 (*)    All other components within normal limits  CBC - Abnormal; Notable for the following:    RBC 3.78 (*)    All  other components within normal limits  TROPONIN I - Abnormal; Notable for the following:    Troponin I 0.10 (*)    All other components within normal limits  I-STAT TROPOININ, ED - Abnormal; Notable for the following:    Troponin i, poc 0.11 (*)    All other components within normal limits    EKG  EKG Interpretation  Date/Time:  Thursday November 24 2015 21:45:11 EDT Ventricular Rate:  79 PR Interval:    QRS Duration: 73 QT Interval:  365 QTC Calculation: 419 R Axis:   93 Text Interpretation:  Sinus rhythm Probable left atrial enlargement Borderline right axis deviation Low voltage, precordial leads No significant change since last tracing Confirmed by Karma Ganja  MD, MARTHA [phone]) on 11/24/2015 9:59:03 PM       Radiology Dg Chest 2 View  Result Date: 11/24/2015 CLINICAL DATA:  Atrial fibrillation today. Chest pain. Right jaw pain. Shortness of breath and cough. Nausea for 3 days. EXAM: CHEST  2 VIEW COMPARISON:  03/21/2007 FINDINGS: Pulmonary hyperinflation. Normal heart size and pulmonary vascularity. No focal airspace disease or consolidation in the lungs. No blunting of costophrenic angles. No pneumothorax. Mediastinal contours appear intact. Cardiac  prosthesis consistent with closure of patent foramen ovale. IMPRESSION: Pulmonary hyperinflation.  No evidence of active pulmonary disease. Electronically Signed   By: Burman Nieves M.D.   On: 11/24/2015 23:31    Procedures Procedures (including critical care time)  Medications Ordered in ED Medications  cyclobenzaprine (FLEXERIL) tablet 10 mg (10 mg Oral Given 11/25/15 0433)  topiramate (TOPAMAX) tablet 200 mg (200 mg Oral Given 11/25/15 0932)  clonazePAM (KLONOPIN) tablet 1 mg (1 mg Oral Given 11/25/15 0141)  desvenlafaxine (PRISTIQ) 24 hr tablet 100 mg (100 mg Oral Given 11/25/15 1043)  ondansetron (ZOFRAN-ODT) disintegrating tablet 8 mg (not administered)  acetaminophen (TYLENOL) tablet 650 mg (650 mg Oral Given 11/25/15 0932)  ondansetron (ZOFRAN) injection 4 mg (4 mg Intravenous Given 11/25/15 0932)  sodium chloride 0.9 % bolus 1,000 mL (0 mLs Intravenous Stopped 11/25/15 0224)     Initial Impression / Assessment and Plan / ED Course  I have reviewed the triage vital signs and the nursing notes.  Pertinent labs & imaging results that were available during my care of the patient were reviewed by me and considered in my medical decision making (see chart for details).  Clinical Course   50 year old female presents with SVT which has resolved before arriving at the ED. She is hypotensive which is her baseline. HR is 60s-70s. All other vitals are WNL and stable. EMS EKG reviewed which appears to be SVT and repeat is NSR. She is estimated to be in SVT for ~1.5 hours. Repeat EKG in ED is NSR as well. I-stat troponin is 0.11 and formal trop is .10. Most likely due to SVT rather than ischemia. All other labs are unremarkable. CXR shows some hyperinflation. Spoke with Dr. Clarnce Flock with Cardiology who will come see patient.  Update: Dr. Clarnce Flock will admit patient for further management. Please see his consult note for details.  Final Clinical Impressions(s) / ED Diagnoses   Final diagnoses:  SVT  (supraventricular tachycardia) [facility])    New Prescriptions New Prescriptions   No medications on file     Bethel Born, PA-C 11/25/15 1418    Jerelyn Scott, MD 11/29/15 1610

## 2015-11-24 NOTE — ED Notes (Signed)
Per EMS pt took 324 mg ASA prior to there arrival and EMS administered 4 mg Zofran IV for pt's c/o nausea.

## 2015-11-25 DIAGNOSIS — I471 Supraventricular tachycardia: Secondary | ICD-10-CM | POA: Diagnosis not present

## 2015-11-25 DIAGNOSIS — I951 Orthostatic hypotension: Secondary | ICD-10-CM | POA: Diagnosis not present

## 2015-11-25 DIAGNOSIS — R000 Tachycardia, unspecified: Secondary | ICD-10-CM

## 2015-11-25 DIAGNOSIS — I214 Non-ST elevation (NSTEMI) myocardial infarction: Secondary | ICD-10-CM | POA: Diagnosis not present

## 2015-11-25 LAB — CBC
HEMATOCRIT: 35.5 % — AB (ref 36.0–46.0)
Hemoglobin: 11.8 g/dL — ABNORMAL LOW (ref 12.0–15.0)
MCH: 33.1 pg (ref 26.0–34.0)
MCHC: 33.2 g/dL (ref 30.0–36.0)
MCV: 99.4 fL (ref 78.0–100.0)
Platelets: 145 10*3/uL — ABNORMAL LOW (ref 150–400)
RBC: 3.57 MIL/uL — ABNORMAL LOW (ref 3.87–5.11)
RDW: 12.3 % (ref 11.5–15.5)
WBC: 4.6 10*3/uL (ref 4.0–10.5)

## 2015-11-25 LAB — BASIC METABOLIC PANEL
Anion gap: 7 (ref 5–15)
CALCIUM: 8.4 mg/dL — AB (ref 8.9–10.3)
CO2: 25 mmol/L (ref 22–32)
Chloride: 106 mmol/L (ref 101–111)
Creatinine, Ser: 0.98 mg/dL (ref 0.44–1.00)
GFR calc Af Amer: >60 mL/min (ref >60)
GLUCOSE: 99 mg/dL (ref 65–99)
POTASSIUM: 4.3 mmol/L (ref 3.5–5.1)
Sodium: 138 mmol/L (ref 135–145)

## 2015-11-25 LAB — T4, FREE: FREE T4: 0.77 ng/dL (ref 0.61–1.12)

## 2015-11-25 LAB — LACTIC ACID, PLASMA
LACTIC ACID, VENOUS: 1.3 mmol/L (ref 0.5–1.9)
LACTIC ACID, VENOUS: 0.6 mmol/L (ref 0.5–1.9)

## 2015-11-25 LAB — TSH: TSH: 3.939 u[IU]/mL (ref 0.350–4.500)

## 2015-11-25 LAB — BRAIN NATRIURETIC PEPTIDE: B NATRIURETIC PEPTIDE 5: 22 pg/mL (ref 0.0–100.0)

## 2015-11-25 LAB — MRSA PCR SCREENING: MRSA BY PCR: NEGATIVE

## 2015-11-25 MED ORDER — ONDANSETRON 4 MG PO TBDP: 8.0000 mg | ORAL_TABLET | Freq: Every day | ORAL | Status: DC | PRN

## 2015-11-25 MED ORDER — TOPIRAMATE 100 MG PO TABS
200.0000 mg | ORAL_TABLET | Freq: Every day | ORAL | Status: DC
  Administered 2015-11-25: 200 mg via ORAL
  Filled 2015-11-25: qty 2

## 2015-11-25 MED ORDER — DESVENLAFAXINE SUCCINATE ER 100 MG PO TB24
100.0000 mg | ORAL_TABLET | Freq: Every day | ORAL | Status: DC
  Administered 2015-11-25: 100 mg via ORAL
  Filled 2015-11-25: qty 1

## 2015-11-25 MED ORDER — CLONAZEPAM 1 MG PO TABS
1.0000 mg | ORAL_TABLET | Freq: Two times a day (BID) | ORAL | Status: DC | PRN
  Administered 2015-11-25: 1 mg via ORAL
  Filled 2015-11-25: qty 2

## 2015-11-25 MED ORDER — ACETAMINOPHEN 325 MG PO TABS
650.0000 mg | ORAL_TABLET | ORAL | Status: DC | PRN
  Administered 2015-11-25 (×2): 650 mg via ORAL
  Filled 2015-11-25 (×2): qty 2

## 2015-11-25 MED ORDER — CYCLOBENZAPRINE HCL 10 MG PO TABS
10.0000 mg | ORAL_TABLET | Freq: Every evening | ORAL | Status: DC | PRN
  Administered 2015-11-25: 10 mg via ORAL
  Filled 2015-11-25: qty 1

## 2015-11-25 MED ORDER — ONDANSETRON HCL 4 MG/2ML IJ SOLN
4.0000 mg | Freq: Four times a day (QID) | INTRAMUSCULAR | Status: DC | PRN
  Administered 2015-11-25: 4 mg via INTRAVENOUS
  Filled 2015-11-25: qty 2

## 2015-11-25 MED ORDER — SODIUM CHLORIDE 0.9 % IV BOLUS (SEPSIS)
1000.0000 mL | Freq: Once | INTRAVENOUS | Status: AC
  Administered 2015-11-25: 1000 mL via INTRAVENOUS

## 2015-11-25 NOTE — ED Notes (Signed)
Report called to Dow Adolph, RN at this time.  Receiving nurse denies having any further questions at this time.

## 2015-11-25 NOTE — Consult Note (Signed)
ELECTROPHYSIOLOGY CONSULT NOTE    Patient ID: Jennifer [name] MRN: [id number], DOB/AGE: 48-May-1967 48 y.o.  Admit date: 11/24/2015 Date of Consult: 11/25/2015  Primary Physician: [name], MD Electrophysiologist: Mathis Bud MD: Fudim  Reason for Consultation: SVT  HPI:  Jennifer [name] is a 50 y.o. female with a longstanding history of POTS and dysautonomia.  She has been followed by Dr [name] for several years with a variety of lifestyle modification and medications used.  She was last seen by Dr [name] in August of this year at which time she was relatively stable.  Last night, she developed tachypalpitations that she is usually able to terminate with lying down. Her POTS have been much more symptomatic lately. She took a propranolol last on Wednesday.  She laid down and her heart rate significantly increased (HR 210). She was symptomatic with nausea, chest pressure and shortness of breath. She took Propranolol 40mg  without improvement. She called EMS and when she was being transferred to the stretcher, her rhythm spontaneously terminated. Troponin was slightly elevated and she was admitted for further evaluation.   Echo 10/2015 demonstrated EF 55-60%, no RWMA, ASD closure device in place.  Past Medical History:  Diagnosis Date  . Anxiety   . Arthritis    inactive RA- no meds  . ASD (atrial septal defect)    Closure   . Depression   . Dysrhythmia    PVCs  . Headache(784.0)    migraines  . Hemochromatosis   . IBS (irritable bowel syndrome)   . Orthostatic syncope   . POTS (postural orthostatic tachycardia syndrome)   . Rheumatoid arthritis(714.0) 02/04/2012     Surgical History:  Past Surgical History:  Procedure Laterality Date  . CHOLECYSTECTOMY  1989  . DILATION AND CURETTAGE OF UTERUS  2004  . LAPAROSCOPIC TUBAL LIGATION  10/26/2010   Procedure: LAPAROSCOPIC TUBAL LIGATION;  Surgeon: 10/28/2010;  Location: [facility];  Service: Gynecology;  Laterality:  Bilateral;  . LAPAROSCOPY    . PATENT FORAMEN OVALE CLOSURE  2008     Prescriptions Prior to Admission  Medication Sig Dispense Refill Last Dose  . clonazePAM (KLONOPIN) 2 MG tablet Take 1 mg by mouth as needed for anxiety.    11/24/2015 at Unknown time  . cyclobenzaprine (FLEXERIL) 10 MG tablet Take 10 mg by mouth at bedtime as needed for muscle spasms.    Past Week at Unknown time  . desvenlafaxine (PRISTIQ) 100 MG 24 hr tablet Take 100 mg by mouth every morning.   11/24/2015 at Unknown time  . etanercept (ENBREL) 50 MG/ML injection Inject 50 mg into the skin once a week.   Past Week at Unknown time  . midodrine (PROAMATINE) 5 MG tablet Take one tablet by mouth 3 times daily @ 7:00 am, 11:00 am and 3:00 pm 270 tablet 3 11/24/2015 at Unknown time  . ondansetron (ZOFRAN-ODT) 8 MG disintegrating tablet Take 8 mg by mouth daily as needed for nausea or vomiting. Inside cheek   11/24/2015 at Unknown time  . propranolol (INDERAL) 20 MG tablet TAKE 1 TABLET BY MOUTH TWICE DAILY AS NEEDED FOR INCREASE HEART RATE 180 tablet 2 Past Month at Unknown time  . topiramate (TOPAMAX) 100 MG tablet Take 2 tablets (200 mg total) by mouth daily. For mood stabilization 30 tablet 0 11/23/2015 at Unknown time    Inpatient Medications:  . desvenlafaxine  100 mg Oral Q breakfast  . topiramate  200 mg Oral Daily    Allergies:  Allergies  Allergen Reactions  . Florinef [Fludrocortisone] Other (See Comments)    Severe migraine  . Leflunomide Diarrhea  . Methotrexate Derivatives Hives  . Reglan [Metoclopramide]     Jerky   . Remicade [Infliximab] Hives, Diarrhea and Other (See Comments)    Increase heart rate,decrease blood pressure  . Morphine And Related Rash    Rxn to IV morphine; has never had PO morphine  . Sulfonamide Derivatives Rash    Social History   Social History  . Marital status: Divorced    Spouse name: N/A  . Number of children: N/A  . Years of education: N/A   Occupational History  .  Not on file.   Social History Main Topics  . Smoking status: Never Smoker  . Smokeless tobacco: Never Used  . Alcohol use Yes     Comment: glass wine per month  . Drug use: Unknown  . Sexual activity: No   Other Topics Concern  . Not on file   Social History Narrative  . No narrative on file     Family History  Problem Relation Age of Onset  . Hemochromatosis Mother   . Hemochromatosis Sister      Review of Systems: All other systems reviewed and are otherwise negative except as noted above.  Physical Exam: Vitals:   11/25/15 0435 11/25/15 0445 11/25/15 0757 11/25/15 0800  BP:  (!) 85/54  (!) 77/49  Pulse:  66 74 69  Resp:  15 14 16   Temp:   97.9 F (36.6 C)   TempSrc:   Oral   SpO2:  99% 98% 97%  Weight: 136 lb 9.6 oz (62 kg)     Height:        GEN- The patient is well appearing, alert and oriented x 3 today.   HEENT: normocephalic, atraumatic; sclera clear, conjunctiva pink; hearing intact; oropharynx clear; neck supple  Lungs- Clear to ausculation bilaterally, normal work of breathing.  No wheezes, rales, rhonchi Heart- Regular rate and rhythm, no murmurs, rubs or gallops, PMI not laterally displaced GI- soft, non-tender, non-distended, bowel sounds present, no hepatosplenomegaly Extremities- no clubbing, cyanosis, or edema; DP/PT/radial pulses 2+ bilaterally MS- no significant deformity or atrophy Skin- warm and dry, no rash or lesion Psych- euthymic mood, full affect Neuro- strength and sensation are intact  Labs:   Lab Results  Component Value Date   WBC 4.6 11/25/2015   HGB 11.8 (L) 11/25/2015   HCT 35.5 (L) 11/25/2015   MCV 99.4 11/25/2015   PLT 145 (L) 11/25/2015     Recent Labs Lab 11/25/15 0437  NA 138  K 4.3  CL 106  CO2 25  BUN <5*  CREATININE 0.98  CALCIUM 8.4*  GLUCOSE 99      Radiology/Studies: Dg Chest 2 View Result Date: 11/24/2015 CLINICAL DATA:  Atrial fibrillation today. Chest pain. Right jaw pain. Shortness of breath  and cough. Nausea for 3 days. EXAM: CHEST  2 VIEW COMPARISON:  03/21/2007 FINDINGS: Pulmonary hyperinflation. Normal heart size and pulmonary vascularity. No focal airspace disease or consolidation in the lungs. No blunting of costophrenic angles. No pneumothorax. Mediastinal contours appear intact. Cardiac prosthesis consistent with closure of patent foramen ovale. IMPRESSION: Pulmonary hyperinflation.  No evidence of active pulmonary disease. Electronically Signed   By: Burman Nieves M.D.   On: 11/24/2015 23:31    PXT:[facility] - difficult to see P waves on EMS EKG, but likely mid RP tachycardia with a cycle length of  TELEMETRY: sinus rhythm  Assessment/Plan: 1.  SVT The patient had symptomatic SVT different from her usual tachycardia associated with POTS and dysautonomia.  She is chronically hypotensive and not able to tolerate daily BB As this is her first episode of SVT, would not pursue ablation at this time. If she has recurrence, could consider AAD therapy or ablation.  Educated on vagal maneuvers today Discussed with Dr [name], will put in referral to Dr Read Drivers at [facility].   2.  POTS/dysautonimia Management per Dr [name]  3.  Elevated troponin Likely due to demand ischemia from SVT Doubt cardiac ischemia  Dr Johney Frame to see later today  Signed, Gypsy Balsam, NP 11/25/2015 10:43 AM   I have seen, examined the patient, and reviewed the above assessment and plan. On exam,RRR.  No symptoms currently.   Changes to above are made where necessary.  SVT discussed with patient.  Vagal maneuvers reviewed.  Per Dr [name], will keep patient on home medicines and keep scheduled EP follow-up with Dr [name].  OK to discharge from EP standpoint.  Co Sign: Hillis Range, MD 11/25/2015 9:43 PM

## 2015-11-25 NOTE — Discharge Summary (Signed)
ELECTROPHYSIOLOGY PROCEDURE DISCHARGE SUMMARY    Patient ID: Jennifer [name],  MRN: [id number], DOB/AGE: 08-Dec-1965 50 y.o.  Admit date: 11/24/2015 Discharge date: 11/25/2015  Primary Care Physician: [name], MD Electrophysiologist: [name]  Primary Discharge Diagnosis:  Active Problems:   SVT (supraventricular tachycardia) (HCC)   Allergies  Allergen Reactions  . Florinef [Fludrocortisone] Other (See Comments)    Severe migraine  . Leflunomide Diarrhea  . Methotrexate Derivatives Hives  . Reglan [Metoclopramide]     Jerky   . Remicade [Infliximab] Hives, Diarrhea and Other (See Comments)    Increase heart rate,decrease blood pressure  . Morphine And Related Rash    Rxn to IV morphine; has never had PO morphine  . Sulfonamide Derivatives Rash   Brief HPI/Hospital Course:  Jennifer [name] is a 50 y.o. female with a history of POTS and dysautonomia. She developed tachypalpitations different from her usual tachycardia on the day of admission that was associated with chest tightness, nausea, and shortness of breath. She took propranolol and called EMS. She returned to SR while being transferred to the stretcher with EMS she converted to SR. She [facility]. Troponin slightly elevated 2/2 demand ischemia. As this is her first episode of SVT, will not make medication changes or pursue ablation at this time. She has been seen by Dr Johney Frame and considered stable for discharge to home with outpatient follow up with Dr [name].   Physical Exam: Vitals:   11/25/15 0445 11/25/15 0757 11/25/15 0800 11/25/15 1145  BP: (!) 85/54  (!) 77/49 (!) 88/61  Pulse: 66 74 69 69  Resp: 15 14 16 16   Temp:  97.9 F (36.6 C)  98.1 F (36.7 C)  TempSrc:  Oral    SpO2: 99% 98% 97%   Weight:      Height:         Labs:   Lab Results  Component Value Date   WBC 4.6 11/25/2015   HGB 11.8 (L) 11/25/2015   HCT 35.5 (L) 11/25/2015   MCV 99.4 11/25/2015   PLT 145 (L) 11/25/2015      Recent Labs Lab 11/25/15 0437  NA 138  K 4.3  CL 106  CO2 25  BUN <5*  CREATININE 0.98  CALCIUM 8.4*  GLUCOSE 99     Discharge Medications:  Current Discharge Medication List    CONTINUE these medications which have NOT CHANGED   Details  clonazePAM (KLONOPIN) 2 MG tablet Take 1 mg by mouth as needed for anxiety.     cyclobenzaprine (FLEXERIL) 10 MG tablet Take 10 mg by mouth at bedtime as needed for muscle spasms.     desvenlafaxine (PRISTIQ) 100 MG 24 hr tablet Take 100 mg by mouth every morning.    etanercept (ENBREL) 50 MG/ML injection Inject 50 mg into the skin once a week.    midodrine (PROAMATINE) 5 MG tablet Take one tablet by mouth 3 times daily @ 7:00 am, 11:00 am and 3:00 pm Qty: 270 tablet, Refills: 3    ondansetron (ZOFRAN-ODT) 8 MG disintegrating tablet Take 8 mg by mouth daily as needed for nausea or vomiting. Inside cheek    propranolol (INDERAL) 20 MG tablet TAKE 1 TABLET BY MOUTH TWICE DAILY AS NEEDED FOR INCREASE HEART RATE Qty: 180 tablet, Refills: 2    topiramate (TOPAMAX) 100 MG tablet Take 2 tablets (200 mg total) by mouth daily. For mood stabilization Qty: 30 tablet, Refills: 0   Associated Diagnoses: Palpitations  Disposition: Pt is being discharged home today in good condition. Discharge Instructions    Diet - low sodium heart healthy    Complete by:  As directed    Increase activity slowly    Complete by:  As directed      Follow-up Information    Sherryl Manges, MD Follow up on 01/20/2016.   Specialty:  Cardiology Why:  at 11:15AM Contact information: [address] [place] [address] [phone]           Duration of Discharge Encounter: Greater than 30 minutes including physician time.  Signed, Gypsy Balsam, NP 11/25/2015 12:08 PM   Hillis Range MD, [facility] 11/25/2015 9:45 PM

## 2015-11-25 NOTE — H&P (Signed)
History & Physical    Patient ID: Jennifer [name] MRN: [id number], DOB/AGE: May 26, 1965   Admit date: 11/24/2015   Primary Physician: [name], MD Primary Cardiologist: [name]  Patient Profile    50 y o with POTS, RA and hemochromocytosis who presents for tachycardia and found to have NSTEMI  Past Medical History    Past Medical History:  Diagnosis Date  . Anxiety   . Arthritis    inactive RA- no meds  . ASD (atrial septal defect)    Closure   . Depression   . Dysrhythmia    PVCs  . Headache(784.0)    migraines  . Hemochromatosis   . IBS (irritable bowel syndrome)   . Orthostatic syncope   . POTS (postural orthostatic tachycardia syndrome)   . Rheumatoid arthritis(714.0) 02/04/2012    Past Surgical History:  Procedure Laterality Date  . CHOLECYSTECTOMY  1989  . DILATION AND CURETTAGE OF UTERUS  2004  . LAPAROSCOPIC TUBAL LIGATION  10/26/2010   Procedure: LAPAROSCOPIC TUBAL LIGATION;  Surgeon: Zelphia Cairo;  Location: [facility];  Service: Gynecology;  Laterality: Bilateral;  . LAPAROSCOPY    . PATENT FORAMEN OVALE CLOSURE  2008     Allergies  Allergies  Allergen Reactions  . Florinef [Fludrocortisone] Other (See Comments)    Severe migraine  . Leflunomide Diarrhea  . Methotrexate Derivatives Hives  . Reglan [Metoclopramide]     Jerky   . Remicade [Infliximab] Hives, Diarrhea and Other (See Comments)    Increase heart rate,decrease blood pressure  . Morphine And Related Rash    Rxn to IV morphine; has never had PO morphine  . Sulfonamide Derivatives Rash    History of Present Illness    Jennifer [name] has a PMH of POTS, RA on biologic,PFO closure in her 30's for migranes, hemochromocytosis and is followed by Dr. [name] in EP. Her POTS has been somewhat well controlled with somewhat higher HR in the last few months. Her HR range 140-180 with standing. She takes propranolol on and off (about every few days). Last dose was 2 days ago. When she takes her  propranolol she also has to take midodrine for hypotension. In the past she has been on florinef.   Today she noted to feel poorly and have palpitations. She was supine when it started which is unusual. She noted her HR to be >200. EMS was called and recorded a strip showing a HR of 220 (scanned). HR lasted for over 60 minutes and broke spontaneously.    She denies fevers, chills or night sweats. No Sob, cp, no orthopnea, pnd. No new meds. No new stressors.   Trop I the ED mildly elevated  Home Medications    Prior to Admission medications   Medication Sig Start Date End Date Taking? Authorizing Provider  clonazePAM (KLONOPIN) 2 MG tablet Take 1 mg by mouth as needed for anxiety.    Yes Historical Provider, MD  cyclobenzaprine (FLEXERIL) 10 MG tablet Take 10 mg by mouth at bedtime as needed for muscle spasms.    Yes Historical Provider, MD  desvenlafaxine (PRISTIQ) 100 MG 24 hr tablet Take 100 mg by mouth every morning.   Yes Historical Provider, MD  etanercept (ENBREL) 50 MG/ML injection Inject 50 mg into the skin once a week.   Yes Historical Provider, MD  midodrine (PROAMATINE) 5 MG tablet Take one tablet by mouth 3 times daily @ 7:00 am, 11:00 am and 3:00 pm 10/17/15  Yes Duke Salvia, MD  ondansetron (  ZOFRAN-ODT) 8 MG disintegrating tablet Take 8 mg by mouth daily as needed for nausea or vomiting. Inside cheek   Yes Historical Provider, MD  propranolol (INDERAL) 20 MG tablet TAKE 1 TABLET BY MOUTH TWICE DAILY AS NEEDED FOR INCREASE HEART RATE 10/17/15  Yes Duke Salvia, MD  topiramate (TOPAMAX) 100 MG tablet Take 2 tablets (200 mg total) by mouth daily. For mood stabilization 10/07/15  Yes Duke Salvia, MD    Family History    Family History  Problem Relation Age of Onset  . Hemochromatosis Mother   . Hemochromatosis Sister     Social History    Social History   Social History  . Marital status: Divorced    Spouse name: N/A  . Number of children: N/A  . Years of  education: N/A   Occupational History  . Not on file.   Social History Main Topics  . Smoking status: Never Smoker  . Smokeless tobacco: Never Used  . Alcohol use Yes     Comment: glass wine per month  . Drug use: Unknown  . Sexual activity: No   Other Topics Concern  . Not on file   Social History Narrative  . No narrative on file     Review of Systems    General:  No chills, fever, night sweats or weight changes.  Cardiovascular:  No chest pain, dyspnea on exertion, edema, orthopnea, palpitations, paroxysmal nocturnal dyspnea. Dermatological: No rash, lesions/masses Respiratory: No cough, dyspnea Urologic: No hematuria, dysuria Abdominal:   No nausea, vomiting, diarrhea, bright red blood per rectum, melena, or hematemesis Neurologic:  No visual changes, wkns, changes in mental status. All other systems reviewed and are otherwise negative except as noted above.  Physical Exam    Blood pressure (!) 86/63, pulse 72, temperature 98.6 F (37 C), temperature source Oral, resp. rate 14, height 5\' 7"  (1.702 m), weight 61.2 kg (135 lb), SpO2 100 %.  General: Pleasant, NAD. Long arms and legs.  Psych: Normal affect. Neuro: Alert and oriented X 3. Moves all extremities spontaneously. HEENT: Normal  Neck: Supple without bruits or JVD. Lungs:  Resp regular and unlabored, CTA. Heart: RRR no s3, s4, or murmurs. Abdomen: Soft, non-tender, non-distended, BS + x 4.  Extremities: No clubbing, cyanosis or edema. DP/PT/Radials 2+ and equal bilaterally.  Labs    Troponin [facility] of Care Test)  Recent Labs  11/24/15 2248  TROPIPOC 0.11*    Recent Labs  11/24/15 2302  TROPONINI 0.10*   Lab Results  Component Value Date   WBC 4.1 11/24/2015   HGB 12.7 11/24/2015   HCT 37.4 11/24/2015   MCV 98.9 11/24/2015   PLT 163 11/24/2015    Recent Labs Lab 11/24/15 2242  NA 135  K 3.7  CL 106  CO2 25  BUN <5*  CREATININE 0.99  CALCIUM 8.6*  GLUCOSE 90   No results found  for: CHOL, HDL, LDLCALC, TRIG No results found for: [facility]   Radiology Studies    No results found.  ECG & Cardiac Imaging    SVT with HR of ~220, Diffuse ST depressions  Assessment & Plan    Jennifer [name] has a PMH of POTS ans s/p PFO closure. She now presents with SVT with HR >200. This is a new occurrence for her and does not appear to be related to her POTS given the timing and occurrence during supine and sitting. I can not identify a trigger at this time point. Will check TFT. I  think SVT ablation would be a good approach to her SVT. She wont be able to tolerate standing doses of BB as she is running low on her BP anyway's.   Her NSTEMI is likely type 2 given her tachycardia. She has no risk factors for CAD. Might be a good idea though to check coronaries before SVT ablation.   Of note: She has arachnodactyly. While her joints are not laxative she could have Ehlers-Danlos. This has been found to be associated with POTS and I would consider further workup (including genetic workup). Consider syncope clinic with Dr. Read Drivers at [facility].   Signed, Macario Golds, MD 11/25/2015, 1:24 AM

## 2015-11-25 NOTE — ED Notes (Signed)
Notified by lab at this time pt's Troponin level is 0.10. MD notified of pt's troponin level.

## 2015-11-28 NOTE — Telephone Encounter (Signed)
Patient recently hospitalized per Dr. [name].

## 2015-12-09 ENCOUNTER — Telehealth: Payer: Self-pay | Admitting: Internal Medicine

## 2015-12-09 NOTE — Telephone Encounter (Signed)
I left a message for the patient to please call to follow up. We have received an "Attending Physician Statement" for Dr. [name] to complete and he is asking for the patient's input on this.

## 2015-12-12 NOTE — Telephone Encounter (Signed)
Follow Up:     Returning Jennifer [name]'s call from Friday.

## 2015-12-12 NOTE — Telephone Encounter (Signed)
Spoke with pt who is aware Jennifer [name] is not in the office today but will be in tomorrow.  Pt reports she is sure this is about a disability form she needs to have filled out.  Aware I will forward this to [facility] and she will c/b.

## 2015-12-15 NOTE — Telephone Encounter (Signed)
I called and spoke with the patient to obtain information on her activity level for "Attending Physician Statement."  She states that she passed out on Tuesday. HR's are running 150's and she will take propranolol- using almost daily and HR's will come down to the 90's.  BP's are 90/50-60's. She has not been using midodrine lately. She is having some chest pain intermittently. I have rescheduled her 11/17 appt with Dr. [name] to 11/1.  I have advised her if I have a cancellation, I will try to get her in sooner.  She inquired if Dr. [name] could give her a short term RX for her flexeril 10 mg once daily at bedtime as needed for fibromyalgia. She is having a flare up of this and is in between rheumatologist at this time. She will be establishing with Dr. Mariah Milling at [facility] in [place]. I advised I am uncertain if Dr. [name] will fill this short term for her, but I will ask and get back with her. She is agreeable.

## 2015-12-15 NOTE — Telephone Encounter (Signed)
I can not Rx flexeril Is she wearing compression clothing  Getting any horizontal exercise?

## 2015-12-20 NOTE — Telephone Encounter (Signed)
I called and spoke with the patient. She is aware Dr. [name] will not prescribe flexeril. She is seeing her PCP on Thursday. She has not been wearing compression clothing as she is mostly home. She has an abdominal binder. I have encouraged her to wear this if she is up during the day. She voices understanding.

## 2015-12-27 ENCOUNTER — Telehealth: Payer: Self-pay | Admitting: Rheumatology

## 2015-12-27 DIAGNOSIS — M0579 Rheumatoid arthritis with rheumatoid factor of multiple sites without organ or systems involvement: Secondary | ICD-10-CM

## 2015-12-27 NOTE — Telephone Encounter (Signed)
Patient is requesting refill for Enbrel. Patient is requesting it be refilled through [facility] on [address] She states she uses [facility] but that will take 2 weeks and she needs it before then.

## 2015-12-28 NOTE — Telephone Encounter (Signed)
30 d supply only. NO more till fu

## 2015-12-28 NOTE — Telephone Encounter (Signed)
Patient has advised our office she will no longer be treated here as a patient. Her last visit with you  was in April and she declines to make a follow up  Patient states her appt with new rheumatologist is not scheduled until Jan, since she has has had a heart attack, the appt was rescheduled, She states the heart attack was Sept 21st. She then states not a heart attack, she has SVT's.   Will you approve her Enbrel without a current office visit Labs WNL 10/05/15 (CBC CMP) Her TB gold was neg on 04/2015

## 2015-12-29 ENCOUNTER — Telehealth: Payer: Self-pay | Admitting: Rheumatology

## 2015-12-29 DIAGNOSIS — M0579 Rheumatoid arthritis with rheumatoid factor of multiple sites without organ or systems involvement: Secondary | ICD-10-CM | POA: Insufficient documentation

## 2015-12-29 MED ORDER — ETANERCEPT 50 MG/ML ~~LOC~~ SOAJ: 50.0000 mg | SUBCUTANEOUS | 0 refills | Status: DC

## 2015-12-29 NOTE — Telephone Encounter (Signed)
Sent in called patient to advise.  

## 2015-12-29 NOTE — Telephone Encounter (Signed)
[facility] states [facility] is the only one that can fill Enbrel, rx was sent to [facility] for a 1 month supply. Can you Please resend rx for [facility] rx?

## 2015-12-30 MED ORDER — ETANERCEPT 50 MG/ML ~~LOC~~ SOAJ: 50.0000 mg | SUBCUTANEOUS | 0 refills | Status: DC

## 2015-12-30 NOTE — Telephone Encounter (Signed)
resent

## 2016-01-04 ENCOUNTER — Ambulatory Visit (INDEPENDENT_AMBULATORY_CARE_PROVIDER_SITE_OTHER): Payer: Commercial Managed Care - HMO | Admitting: Internal Medicine

## 2016-01-04 ENCOUNTER — Encounter: Payer: Self-pay | Admitting: Internal Medicine

## 2016-01-04 VITALS — BP 92/86 | HR 87

## 2016-01-04 DIAGNOSIS — I951 Orthostatic hypotension: Secondary | ICD-10-CM

## 2016-01-04 DIAGNOSIS — R002 Palpitations: Secondary | ICD-10-CM

## 2016-01-04 DIAGNOSIS — G909 Disorder of the autonomic nervous system, unspecified: Secondary | ICD-10-CM | POA: Diagnosis not present

## 2016-01-04 DIAGNOSIS — G90A Postural orthostatic tachycardia syndrome (POTS): Secondary | ICD-10-CM

## 2016-01-04 DIAGNOSIS — G901 Familial dysautonomia [Riley-Day]: Secondary | ICD-10-CM

## 2016-01-04 DIAGNOSIS — R000 Tachycardia, unspecified: Secondary | ICD-10-CM

## 2016-01-04 MED ORDER — MIDODRINE HCL 10 MG PO TABS: 10.0000 mg | ORAL_TABLET | Freq: Three times a day (TID) | ORAL | Status: DC

## 2016-01-04 NOTE — Patient Instructions (Addendum)
Medication Instructions: - Your physician has recommended you make the following change in your medication:  1) Increase proamitine (midodrine) to 10 mg three times a day  Labwork: - none ordered  Procedures/Testing: - none ordered  Follow-Up: - Your physician wants you to follow-up in: 6 months with Dr. [name]. You will receive a reminder letter in the mail two months in advance. If you don't receive a letter, please call our office to schedule the follow-up appointment.  Any Additional Special Instructions Will Be Listed Below (If Applicable).     If you need a refill on your cardiac medications before your next appointment, please call your pharmacy.

## 2016-01-04 NOTE — Progress Notes (Signed)
Patient Care Team: [name], MD as PCP - General (Family Medicine) Levert Feinstein, MD as Consulting Physician (Oncology) Duke Salvia, MD as Consulting Physician (Cardiology)    HPI  Jennifer [name] is a 50 y.o. female Seen in followup for dysautonomia manifested by hypotension and tachy palpitations  She cant record her BP at home   She finds herself stumbling around the house, without loss of conscsiousness  She has had no syncope   There has been an intercurrent URI with good fluid intake and no change in her status  She suffers both for hemachromatosis as well as seronegative rheumatoid arthritis. She also has primary and secondary anxiety and depression.  She has previously been treated with Florinef and ProAmatine the former she did not tolerate because of migraines the latter was discontinued following tilt table testing at [facility] where she had predominant tachycardia; at that juncture her blood pressures were better and low-dose beta blockers were attempted. She failed to tolerate these.  There has been significant psychosocial stress   8/17 Echo EF normal  8/17 Holter  prsonally  reveiwed  Mean HR 79 min 56/max 145  1% PACs  She has had upright tachycardia although is not identified on this.   Interval hospitalization for initial episode 12, what is to his low that oral a thyroid throat SVT 9/17 seen by JA   Duration > 60 min, EMS called and documented.  Broke spontaneously   She fainted 2 weeks ago in the bathroom without warning   Thankfully nothing broken  HR >>140-60 almost every day even at night   Recorded via the smart phone HR meter;  (Looking at the tracings I am not sure that there are real)  In this regard, also had significant tachypalpitations while wearing her holter                Past Medical History:  Diagnosis Date  . Anxiety   . Arthritis    inactive RA- no meds  . ASD (atrial septal defect)    Closure   .  Depression   . Dysrhythmia    PVCs  . Headache(784.0)    migraines  . Hemochromatosis   . IBS (irritable bowel syndrome)   . Orthostatic syncope   . POTS (postural orthostatic tachycardia syndrome)   . Rheumatoid arthritis(714.0) 02/04/2012    Past Surgical History:  Procedure Laterality Date  . CHOLECYSTECTOMY  1989  . DILATION AND CURETTAGE OF UTERUS  2004  . LAPAROSCOPIC TUBAL LIGATION  10/26/2010   Procedure: LAPAROSCOPIC TUBAL LIGATION;  Surgeon: Zelphia Cairo;  Location: [facility];  Service: Gynecology;  Laterality: Bilateral;  . LAPAROSCOPY    . PATENT FORAMEN OVALE CLOSURE  2008    Current Outpatient Prescriptions  Medication Sig Dispense Refill  . clonazePAM (KLONOPIN) 2 MG tablet Take 1 mg by mouth as needed for anxiety.     . cyclobenzaprine (FLEXERIL) 10 MG tablet Take 10 mg by mouth at bedtime as needed for muscle spasms.     Marland Kitchen desvenlafaxine (PRISTIQ) 100 MG 24 hr tablet Take 100 mg by mouth every morning.    . Methocarbamol (ROBAXIN PO) Take 1 tablet by mouth daily as needed (for firbrimyalgia).    . ondansetron (ZOFRAN-ODT) 8 MG disintegrating tablet Take 8 mg by mouth daily as needed for nausea or vomiting. Inside cheek    . propranolol (INDERAL) 20 MG tablet TAKE 1 TABLET BY MOUTH TWICE DAILY AS NEEDED FOR  INCREASE HEART RATE 180 tablet 2  . topiramate (TOPAMAX) 100 MG tablet Take 2 tablets (200 mg total) by mouth daily. For mood stabilization 30 tablet 0  . midodrine (PROAMATINE) 10 MG tablet Take 1 tablet (10 mg total) by mouth 3 (three) times daily.     No current facility-administered medications for this visit.     Allergies  Allergen Reactions  . Florinef [Fludrocortisone] Other (See Comments)    Severe migraine  . Leflunomide Diarrhea  . Methotrexate Derivatives Hives  . Reglan [Metoclopramide]     Jerky   . Remicade [Infliximab] Hives, Diarrhea and Other (See Comments)    Increase heart rate,decrease blood pressure  . Morphine And Related Rash     Rxn to IV morphine; has never had PO morphine  . Sulfonamide Derivatives Rash    Review of Systems negative except from HPI and PMH  Physical Exam There were no vitals taken for this visit. Well developed and well nourished in no acute distress HENT normal E scleral and icterus clear Neck Supple JVP flat; carotids brisk and full Clear to ausculation  Regular rate and rhythm, no murmurs gallops or rub Soft with active bowel sounds No clubbing cyanosis none Edema Alert and oriented, grossly normal motor and sensory function Skin Warm and Dry arachnodactyly  ECG sinus rhythm at  86  Assessment and  Plan  Dysautonomia with hypotension and tachycardia  Rheumatoid arthritis  Depression-primary and secondary  Arachnodactyly  SVT  No futher SVT  Discussed the role of ablation which would be life style driven  Syncopal episode worrisome  Thankfully she was not hurt.  We will try and increase the proamatine; previously didn't tolerate higher doses   I am not sure of the tachypalps.   The holter was pretty normal.  I think the HR recordings from her iphone may be artifactually elevated     We will continue to work on fluids and exercise  She will resume use of an abdominal binder and increase proamatine;  She will use low dose inderal for protracted and problematic tachycardia, aware that it can lower BP  It may be worth considering northera at low dose or repeat trial of florinef previously not tolerated    We spent more than 50% of our >50  min visit in face to face counseling regarding the above

## 2016-01-17 ENCOUNTER — Telehealth: Payer: Self-pay | Admitting: Radiology

## 2016-01-17 NOTE — Telephone Encounter (Signed)
Refill request received via fax for Enbrel, this was previously filled for one month supply only/ patient aware Dr Corliss Skains will not fill again for her without appropriate office visits and labs

## 2016-01-20 ENCOUNTER — Ambulatory Visit: Payer: Self-pay | Admitting: Internal Medicine

## 2016-01-23 ENCOUNTER — Telehealth: Payer: Self-pay | Admitting: Internal Medicine

## 2016-01-23 NOTE — Telephone Encounter (Signed)
Received records request [employer], forwarded to [facility] for processing.

## 2016-02-28 ENCOUNTER — Other Ambulatory Visit: Payer: Self-pay | Admitting: Rheumatology

## 2016-03-12 ENCOUNTER — Telehealth: Payer: Self-pay | Admitting: Internal Medicine

## 2016-03-12 NOTE — Telephone Encounter (Signed)
I called and spoke with the patient.  She states she spoke with [employer] this morning and they stated that there was clarification that they were requesting from Dr. [name] on her most recent paperwork that was sent in for her.  She reports that [employer] told her this morning that they were sending Dr. [name] notification of this, she is unsure if this is by fax/ mail. Will review with Dr. [name] when received.

## 2016-03-12 NOTE — Telephone Encounter (Signed)
New message      Talk to the nurse about info on a form that was sent to ins company.  Apparently there is a problem, but the ins co will not tell her what Dr [name] put on the form. Please call

## 2016-03-26 ENCOUNTER — Telehealth: Payer: Self-pay | Admitting: Internal Medicine

## 2016-03-26 NOTE — Telephone Encounter (Signed)
New message      Pt c/o BP issue: STAT if pt c/o blurred vision, one-sided weakness or slurred speech  1. What are your last 5 BP readings?  60/45 for the past 4 days  2. Are you having any other symptoms (ex. Dizziness, headache, blurred vision, passed out)?  Dizziness, headache and feeling fainty  3. What is your BP issue?  Pt takes 5mg  of midodrine tid and her bp goes up to 90/60.  After 3 hrs, bp goes down again. Please advise

## 2016-03-26 NOTE — Telephone Encounter (Signed)
Called patient.  Patient stated that the midodrine 5mg  TID wasn't lasting long enough.  Her b/p was dropping after about 3 hours with symptoms of dizziness, headache & feeling faint.  I checked previous Drs note and discovered that he had upped the midodrine to 10mg  TID.  I informed patient of this.  She stated that she must have missed it somehow, but would start taking the 10mg  TID and see if this helps.  She stated that she would call back in a couple of days if it doesn't help.

## 2016-03-26 NOTE — Telephone Encounter (Signed)
Follow up    Dr. Soyla Murphy peer to peer review states it's very short conversation .

## 2016-03-27 NOTE — Telephone Encounter (Signed)
gesila  Number listed doesn't answer thanks steve

## 2016-03-27 NOTE — Telephone Encounter (Signed)
Dr. Carlos American called back today and Dr. [name] spoke with her about the patient.

## 2016-03-29 ENCOUNTER — Telehealth: Payer: Self-pay | Admitting: Internal Medicine

## 2016-03-29 NOTE — Telephone Encounter (Signed)
Will forward to Dr. Klein to review. 

## 2016-03-29 NOTE — Telephone Encounter (Signed)
Jennifer [name] is calling to send in an update for Dr. [name]. She states that her BP was 52/40 this morning and that it has been running low over the past couple of days. She has been taking the midodrine, 10 mg 3x's a day as directed. She also states that she does not want to take the propranolol because it will make her BP lower. Please call, thanks.

## 2016-03-30 ENCOUNTER — Telehealth: Payer: Self-pay | Admitting: Internal Medicine

## 2016-03-30 NOTE — Telephone Encounter (Signed)
I called and spoke to Jennifer [name]. Blood pressures are as noted. She has not been taking her beta blocker. There is an intercurrent viral illness. Her ProAmatine doses 10 mg 3 times a day. She is drinking water copiously. She is adding salt.  I've encouraged her to increase her sodium intake. I asked her to drink Gatorade as opposed to water. Instructed a chicken bone chicken soup alternative vehicles for sodium. She will increase her ProAmatine from 10--20 mg 3 times a day. In the event that her blood pressure doesn't come up she'll need to go to the emergency room for saline infusion

## 2016-03-30 NOTE — Telephone Encounter (Signed)
Follow up    Pt c/o BP issue: STAT if pt c/o blurred vision, one-sided weakness or slurred speech  1. What are your last 5 BP readings? 54/47  11:20am 1/26  7a 52/40 before she took medication   2. Are you having any other symptoms (ex. Dizziness, headache, blurred vision, passed out)? Having dizziness and headache , trying not to stand up   3. What is your BP issue? Her blood pressure is running low . Highest its been this week 80/60 h

## 2016-03-30 NOTE — Telephone Encounter (Signed)
New message    Pt verbalized that she is calling for rn   Pt wants to know what medications should she be cautious of that will drop or increase her heart rate

## 2016-03-30 NOTE — Telephone Encounter (Signed)
I left a message for the patient to call. 

## 2016-04-02 NOTE — Telephone Encounter (Signed)
I spoke with the patient. She states her SBP has been up to around 80 over the weekend. Her HR's are still 130-140 bpm. She wanted to know if she should take another propranolol. I advised her that per Dr. Odessa Fleming conversation with her last week, he was concerned her high HR's were compensating for her low blood pressure. She is not in distress at this time.  I advised her to continue to monitor her BP readings and if they become upper 80's systolic, to reassess her HR's and if she feels she needs propranolol at that time, she could try using her PRN propranolol ( her baseline SBP is typically around 90).  She states she takes benadryl and a dose of enbrel tomorrow.  She was concerned about effects on her heart rate. I advised benadryl was ok and I could not find any issues with the enbrel, however I would review with Dr. [name] and call her back if this was an issue.  Per Dr. [name]- enbrel ok to use.

## 2016-04-17 ENCOUNTER — Telehealth: Payer: Self-pay | Admitting: Internal Medicine

## 2016-04-17 NOTE — Telephone Encounter (Signed)
New Message:  Pt till having problems with her blood pressure being low.She says she can not tolerate the 60 mg of Proamatine.Her blood pressure goes up in the nineties during the day,but at night it starts dropping.

## 2016-04-19 NOTE — Telephone Encounter (Signed)
Follow up    Pt is following up about her blood pressure.

## 2016-04-19 NOTE — Telephone Encounter (Signed)
I called and spoke with the patient. She states that the side effects she is having from her 60 mg dose of midodrine are: - head itching - leg cramps - chill/ stomach pain - increased diarrhea  She has tried cutting back to 40 mg total daily on the midodrine. She will either take - 10 mg/ 15 mg/ 15 mg or - 20 mg BID  1st dose is usually between 5-7am and her latest dose is usually about 3:30 pm.  Around 6-7pm she starts to notice her BP is starting to drop to the 70 's and on down to the 50's. She is usually awake about 3 am due to leg cramps and her SBP is 50-60's.  She has an abdominal binder, but typically doesn't wear this as she is not up much during the day. She has gained 8 lbs due to increasing her sodium intake. She has a body pillow underneath her regular pillow at night so she is not totally flat while sleeping.  I advised I will need to review with Dr. [name] and call her back later today. He may add an additional medication to her regimen, and she is agreeable with this.

## 2016-04-19 NOTE — Telephone Encounter (Signed)
Heather thank you. I would try and have her take 10 mg every 4 hours so she states her last dose about 6 and see how that works thanks

## 2016-04-19 NOTE — Telephone Encounter (Addendum)
I called and spoke with the patient regarding Dr. Odessa Fleming recommendations.  The patient started to describe that at 10 mg she starts itching, but 20 mg is much worse, so she takes benadryl every times she takes mestinon.  Reviewed with Dr. [name]- he states he would like her to take midodrine 5 mg every 3 hours up until 6 pm daily (a total of 25-30 mg daily).  I have notified the patient of this and she is agreeable to try this.  I have advised her I am uncertain if this will change anything for her, but to please call me back on Tuesday to advise how she is doing.  She voices understanding.

## 2016-04-24 ENCOUNTER — Telehealth: Payer: Self-pay | Admitting: Internal Medicine

## 2016-04-24 ENCOUNTER — Encounter: Payer: Self-pay | Admitting: Internal Medicine

## 2016-04-24 DIAGNOSIS — R000 Tachycardia, unspecified: Secondary | ICD-10-CM

## 2016-04-24 NOTE — Telephone Encounter (Deleted)
error 

## 2016-04-24 NOTE — Telephone Encounter (Deleted)
Error- patient on midodrine 5 mg every 3 hours, not mestinon as noted previously in my note earlier today.

## 2016-04-24 NOTE — Progress Notes (Signed)
Called and spoke to pt BP 80-90s not in the 60s HR 150s  [facility] notes 3/15 suggested proamatine might be driving Now HR rapid at night, these spells are brief  Their notes suggested ivabradine and doxidropa  Will suggest  Raising HOB blocks 4-6" ZIO patch  Bathroom close enough to reach

## 2016-04-24 NOTE — Telephone Encounter (Signed)
New message    Pt verbalized that she is calling to return rn call

## 2016-04-24 NOTE — Telephone Encounter (Addendum)
I spoke with the patient today-   On 2/15 discussed patient with Dr. [name] and below recommendations received.    ".Marland KitchenMarland KitchenThe patient started to describe that at 10 mg (of midodrine) she starts itching, but 20 mg is much worse, so she takes benadryl every times she takes mestinon.  Reviewed with Dr. [name]- he states he would like her to take midodrine 5 mg every 3 hours up until 6 pm daily (a total of 25-30 mg daily)..."  She called back today to update Dr. [name] on how she is feeling-  SBP in the AM- 60-70's upon waking In the afternoon, she is getting SBP's of 80-90's. Her HR is still 150 bpm w/ activity, she has not checked this at rest. Her fast HR's are keeping her awake at night.  Per her report, she is getting in about a total of 15-20 mg of midodrine daily, but no propranolol. Her latest dose is about 4:30 pm- 6pm.  She took her midodrine 5 mg at 1pm yesterday- BP was 99 systolic at the time & HR- 146  At 4:30pm, she was 75/61 (129), Awakening today, she was 78/57 (129) Her itching is not as pronounced although she is still taking benadryl some during the day. She is continuing to have leg cramps.  She is due to take enbrel tomorrow and this also requires her to take benadryl. I advised I will review with Dr. [name] this afternoon and call her back. She is agreeable.

## 2016-04-26 ENCOUNTER — Telehealth: Payer: Self-pay | Admitting: *Deleted

## 2016-04-26 NOTE — Addendum Note (Signed)
Addended by: Sherri Rad C on: 04/26/2016 02:49 PM   Modules accepted: Orders

## 2016-04-26 NOTE — Telephone Encounter (Signed)
-----   Message from Awilda Metro, [facility] sent at 04/26/2016  3:36 PM EST ----- I tried to submit the Northera prior auth for Ms Reller but it rejected because her insurance wasn't up to date. Any chance she has a more update card than the [employer] one?  Thanks, [employer]

## 2016-04-26 NOTE — Telephone Encounter (Signed)
I called and spoke with the patient.  Her insurance is now [employer] (private- not w/ a business) ID # [id number] Group#: [id number] Rx bin: [id number]  Pharmacy help desk: [phone]  Message forwarded to Royston Sinner D.

## 2016-04-26 NOTE — Telephone Encounter (Signed)
Progress Notes by Duke Salvia, MD at 04/24/2016 6:10 PM   Author: Duke Salvia, MD Author Type: Physician Filed: 04/24/2016 6:23 PM  Note Status: Signed Cosign: Cosign Not Required Encounter Date: 04/24/2016 6:10 PM  Editor: Duke Salvia, MD (Physician)    Called and spoke to pt BP 80-90s not in the 60s HR 150s  [facility] notes 3/15 suggested proamatine might be driving Now HR rapid at night, these spells are brief  Their notes suggested ivabradine and doxidropa  Will suggest  Raising HOB blocks 4-6" ZIO patch  Bathroom close enough to reach       I called and spoke with the patient today.  She is aware that I am placing her order for her [employer] patch and she will be contacted about this. She is also aware that we don't have samples of Northera in the office, but I have spoken with our pharmacist, Aundra Millet, about this. Per Aundra Millet, there is a paperwork process for this on the front end.  She has started the PA process today.  We are waiting to hear back from this- I advised her that I will keep her updated regarding the Northera process.

## 2016-04-30 ENCOUNTER — Other Ambulatory Visit: Payer: Self-pay | Admitting: *Deleted

## 2016-04-30 NOTE — Progress Notes (Signed)
Error

## 2016-05-02 ENCOUNTER — Ambulatory Visit (INDEPENDENT_AMBULATORY_CARE_PROVIDER_SITE_OTHER): Payer: BLUE CROSS/BLUE SHIELD

## 2016-05-02 DIAGNOSIS — R000 Tachycardia, unspecified: Secondary | ICD-10-CM

## 2016-05-09 ENCOUNTER — Encounter: Payer: Self-pay | Admitting: Internal Medicine

## 2016-05-09 NOTE — Progress Notes (Signed)
Paper work for [employer] faxed to the company at [phone]. Confirmation received.

## 2016-05-11 ENCOUNTER — Telehealth: Payer: Self-pay | Admitting: Internal Medicine

## 2016-05-11 NOTE — Telephone Encounter (Signed)
The patient is aware to start northera 100 mg BID per Dr. [name]. Discussed with Fletcher Anon D- they will follow up with the patient on Monday 05/14/16 at 11:00 am.  The patient is aware and agreeable.

## 2016-05-11 NOTE — Telephone Encounter (Signed)
New message    Pt is calling to let Herbert Seta know she received Northera.

## 2016-05-14 ENCOUNTER — Ambulatory Visit: Payer: Self-pay

## 2016-05-14 NOTE — Progress Notes (Deleted)
Patient ID: Jennifer [name]                 DOB: August 05, 1965                      MRN: [id number]     HPI: [name] is a 51 y.o. female referred by Dr. [name] to HTN clinic for Northera titration. PMH is significant for POTS, anxiety and depression, and RA. She is unable to record her BP at home and finds herself stumbling around the house without loss of consciousness. In January, she called clinic with home BP readings of 50s/40s and symptoms of dizziness and headache. She was advised to increase her midodrine dose from 10mg  to 20mg  TID, but this caused itching to the point where she needed to take Benadryl with each dose. Her HR also increased to 150s, thought to be due to midodrine. I was contacted to help with Northera approval process. Pt was approved and copay affordable since she has . Pt was advised on 3/9 to start taking Northera 100mg  twice a day per Dr Nurse, learning disability. She presents today for Northera titration.  Still taking midodrine?  She works on fluids and exercise and wears an abdominal binder. Salt intake and staying hydrated, increased sodium intake  Current HTN meds: Northera 100mg  BID, midodrine 5mg  ?, prn propranolol for tachycardia Previously tried: fludrocortisone - migraines, midodrine - tachycardia following tilt table testing at [facility], then failed beta blockers. Also had itching with midodrine 10mg  and 20mg  TID BP goal:   Family History: Mother and sister with hemochromatosis  Social History: Denies tobacco, alcohol, and illicit drug use.  Diet:   Exercise:   Home BP readings:   Wt Readings from Last 3 Encounters:  11/25/15 136 lb 9.6 oz (62 kg)  08/11/13 130 lb 8.2 oz (59.2 kg)  07/24/13 130 lb (59 kg)   BP Readings from Last 3 Encounters:  11/25/15 (!) 88/61  08/14/13 102/72  07/24/13 94/64   Pulse Readings from Last 3 Encounters:  11/25/15 64  08/14/13 67  07/24/13 62    Renal function: CrCl cannot be calculated (Patient's most recent  lab result is older than the maximum 21 days allowed.).  Past Medical History:  Diagnosis Date  . Anxiety   . Arthritis    inactive RA- no meds  . ASD (atrial septal defect)    Closure   . Depression   . Dysrhythmia    PVCs  . Headache(784.0)    migraines  . Hemochromatosis   . IBS (irritable bowel syndrome)   . Orthostatic syncope   . POTS (postural orthostatic tachycardia syndrome)   . Rheumatoid arthritis(714.0) 02/04/2012    Current Outpatient Prescriptions on File Prior to Visit  Medication Sig Dispense Refill  . clonazePAM (KLONOPIN) 2 MG tablet Take 1 mg by mouth as needed for anxiety.     . cyclobenzaprine (FLEXERIL) 10 MG tablet Take 10 mg by mouth at bedtime as needed for muscle spasms.     11/27/15 desvenlafaxine (PRISTIQ) 100 MG 24 hr tablet Take 100 mg by mouth every morning.    . Methocarbamol (ROBAXIN PO) Take 1 tablet by mouth daily as needed (for firbrimyalgia).    . midodrine (PROAMATINE) 10 MG tablet Take 1 tablet (10 mg total) by mouth 3 (three) times daily.    . ondansetron (ZOFRAN-ODT) 8 MG disintegrating tablet Take 8 mg by mouth daily as needed for nausea or vomiting. Inside cheek    .  propranolol (INDERAL) 20 MG tablet TAKE 1 TABLET BY MOUTH TWICE DAILY AS NEEDED FOR INCREASE HEART RATE 180 tablet 2  . topiramate (TOPAMAX) 100 MG tablet Take 2 tablets (200 mg total) by mouth daily. For mood stabilization 30 tablet 0   No current facility-administered medications on file prior to visit.     Allergies  Allergen Reactions  . Florinef [Fludrocortisone] Other (See Comments)    Severe migraine  . Leflunomide Diarrhea  . Methotrexate Derivatives Hives  . Reglan [Metoclopramide]     Jerky   . Remicade [Infliximab] Hives, Diarrhea and Other (See Comments)    Increase heart rate,decrease blood pressure  . Morphine And Related Rash    Rxn to IV morphine; has never had PO morphine  . Sulfonamide Derivatives Rash     Assessment/Plan:

## 2016-05-18 ENCOUNTER — Ambulatory Visit: Payer: Self-pay

## 2016-05-18 NOTE — Progress Notes (Deleted)
Patient ID: Jennifer [name]                 DOB: 1966/02/23                      MRN: [id number]     HPI: Jennifer [name] is a 51 y.o. female patient of Dr. Graciela [name] with PMH below who presents today for hypotension evaluation. PMH includes dysautonomia manifested by hypotension and tachy palpitations.    Cardiac Hx:   Current meds:  Propranolol 20mg   Northera 100mg  BID  Previously tried: midodrine, florinef (migraines), proamatine (discontinued due to tilt table results at [facility] that showed predominately tachycardia but BP ok)  Family History:   Social History:   Diet:   Exercise:   Home BP readings:   Wt Readings from Last 3 Encounters:  11/25/15 136 lb 9.6 oz (62 kg)  10/07/15 139 lb (63 kg)  02/08/14 130 lb 9.6 oz (59.2 kg)   BP Readings from Last 3 Encounters:  01/04/16 92/86  11/25/15 (!) 88/61  10/17/15 (!) 87/64   Pulse Readings from Last 3 Encounters:  01/04/16 87  11/25/15 64  10/17/15 88    Renal function: CrCl cannot be calculated (Patient's most recent lab result is older than the maximum 21 days allowed.).  Past Medical History:  Diagnosis Date  . Anxiety   . Arthritis    inactive RA- no meds  . ASD (atrial septal defect)    Closure   . Depression   . Dysrhythmia    PVCs  . Headache(784.0)    migraines  . Hemochromatosis   . IBS (irritable bowel syndrome)   . Orthostatic syncope   . POTS (postural orthostatic tachycardia syndrome)   . Rheumatoid arthritis(714.0) 02/04/2012    Current Outpatient Prescriptions on File Prior to Visit  Medication Sig Dispense Refill  . clonazePAM (KLONOPIN) 2 MG tablet Take 1 mg by mouth as needed for anxiety.     . cyclobenzaprine (FLEXERIL) 10 MG tablet Take 10 mg by mouth at bedtime as needed for muscle spasms.     10/19/15 desvenlafaxine (PRISTIQ) 100 MG 24 hr tablet Take 100 mg by mouth every morning.    . Methocarbamol (ROBAXIN PO) Take 1 tablet by mouth daily as needed (for firbrimyalgia).    . midodrine  (PROAMATINE) 10 MG tablet Take 1 tablet (10 mg total) by mouth 3 (three) times daily.    . ondansetron (ZOFRAN-ODT) 8 MG disintegrating tablet Take 8 mg by mouth daily as needed for nausea or vomiting. Inside cheek    . propranolol (INDERAL) 20 MG tablet TAKE 1 TABLET BY MOUTH TWICE DAILY AS NEEDED FOR INCREASE HEART RATE 180 tablet 2  . topiramate (TOPAMAX) 100 MG tablet Take 2 tablets (200 mg total) by mouth daily. For mood stabilization 30 tablet 0   No current facility-administered medications on file prior to visit.     Allergies  Allergen Reactions  . Florinef [Fludrocortisone] Other (See Comments)    Severe migraine  . Leflunomide Diarrhea  . Methotrexate Derivatives Hives  . Reglan [Metoclopramide]     Jerky   . Remicade [Infliximab] Hives, Diarrhea and Other (See Comments)    Increase heart rate,decrease blood pressure  . Morphine And Related Rash    Rxn to IV morphine; has never had PO morphine  . Sulfonamide Derivatives Rash    There were no vitals taken for this visit.   Assessment/Plan: Hypertension:    Thank you, 14/04/2011.  Jennifer [name], PharmD  [facility]  05/18/2016 7:47 AM

## 2016-05-28 ENCOUNTER — Ambulatory Visit (INDEPENDENT_AMBULATORY_CARE_PROVIDER_SITE_OTHER): Payer: BLUE CROSS/BLUE SHIELD | Admitting: Pharmacist

## 2016-05-28 ENCOUNTER — Encounter: Payer: Self-pay | Admitting: Pharmacist

## 2016-05-28 VITALS — BP 76/62 | HR 94

## 2016-05-28 DIAGNOSIS — I951 Orthostatic hypotension: Secondary | ICD-10-CM

## 2016-05-28 NOTE — Patient Instructions (Addendum)
Increase to 100 mg three times a day starting tomorrow, then increase to 200 mg three times a day starting Friday. Follow up in clinic next Tuesday. Continue to monitor pressures at home.

## 2016-05-28 NOTE — Progress Notes (Signed)
Patient ID: Jennifer [name]                 DOB: [birth year]                      MRN: [id number]     HPI: Jennifer [name] is a 51 y.o. female referred by Dr. [name] to HTN clinic for Northera (droxidopa) follow up and dose titration. PMH includes dysautonomia with hypotension and tachycardia, POTS, hemachromatosis, s/p MI (11/2015), PVCs, RA, anxiety and depression. She has a history of an episode of syncope without warning, but was not injured. She last saw Dr. [name] in November 2017 at which time her midodrine was increased to 10 mg TID. 2 months ago, she called and midodrine was increased to 20 mg TID. She reported SBP in the 80s, with HR 130-140s. 6 weeks ago, she called to report that her midodrine was causing itching, leg cramps, and GI upset. She was requiring Benadryl with each dose of midodrine to prevent itching. Her dose was adjusted to 5 mg every 3 hours for 25-30 mg daily. Referred for tilt table testing at [facility], and per Dr. [name], they recommended that midodrine was driving elevated HRs and suggested Northera (doxidropa) or ivabradine.    Northera 100 mg BID was started ~2 weeks ago (she takes at 7 am, then at 3-4 pm). She has stopped taking her midodrine, and is afraid to take her propranolol because of it decreasing her blood pressure. With the Northera, she has noticed improvement with increased blood pressures (70s-80s, even up to 110 at a recent appointment). She still has some dizziness and lightheadedness when standing. She raised the head of her bed as directed, but does note that this is uncomfortable. She checks her blood pressure occasionally when laying down, and has gotten systolics in the 70s - no signs of supine hypertension. She does not wear her abdominal binder or compression stockings usually. She notes that she is trying to lose the weight she gained with the increased dietary sodium recommendation.   Current meds:  Northera 100 mg BID Propranolol 20 mg (PRN tachycardia) - has  not been taking  Previously tried: Fludrocortisone (migraines); midodrine (discontinued following tilt table testing - tachycardia, itching, leg cramps, and GI upset)  Home BP readings: This morning was ~74/50, and this is much improved over previous readings. It has been in the 90-100s a few times.  Wt Readings from Last 3 Encounters:  11/25/15 136 lb 9.6 oz (62 kg)  10/07/15 139 lb (63 kg)  02/08/14 130 lb 9.6 oz (59.2 kg)   BP Readings from Last 3 Encounters:  05/28/16 (!) 76/62  01/04/16 92/86  11/25/15 (!) 88/61   Pulse Readings from Last 3 Encounters:  05/28/16 94  01/04/16 87  11/25/15 64    Renal function: CrCl cannot be calculated (Patient's most recent lab result is older than the maximum 21 days allowed.).  Past Medical History:  Diagnosis Date  . Anxiety   . Arthritis    inactive RA- no meds  . ASD (atrial septal defect)    Closure   . Depression   . Dysrhythmia    PVCs  . Headache(784.0)    migraines  . Hemochromatosis   . IBS (irritable bowel syndrome)   . Orthostatic syncope   . POTS (postural orthostatic tachycardia syndrome)   . Rheumatoid arthritis(714.0) 02/04/2012    Current Outpatient Prescriptions on File Prior to Visit  Medication Sig Dispense Refill  .  clonazePAM (KLONOPIN) 2 MG tablet Take 1 mg by mouth as needed for anxiety.     . cyclobenzaprine (FLEXERIL) 10 MG tablet Take 10 mg by mouth at bedtime as needed for muscle spasms.     Marland Kitchen desvenlafaxine (PRISTIQ) 100 MG 24 hr tablet Take 100 mg by mouth every morning.    . Methocarbamol (ROBAXIN PO) Take 1 tablet by mouth daily as needed (for firbrimyalgia).    . ondansetron (ZOFRAN-ODT) 8 MG disintegrating tablet Take 8 mg by mouth daily as needed for nausea or vomiting. Inside cheek    . propranolol (INDERAL) 20 MG tablet TAKE 1 TABLET BY MOUTH TWICE DAILY AS NEEDED FOR INCREASE HEART RATE 180 tablet 2  . topiramate (TOPAMAX) 100 MG tablet Take 2 tablets (200 mg total) by mouth daily. For  mood stabilization 30 tablet 0   No current facility-administered medications on file prior to visit.     Allergies  Allergen Reactions  . Florinef [Fludrocortisone] Other (See Comments)    Severe migraine  . Leflunomide Diarrhea  . Methotrexate Derivatives Hives  . Reglan [Metoclopramide]     Jerky   . Remicade [Infliximab] Hives, Diarrhea and Other (See Comments)    Increase heart rate,decrease blood pressure  . Morphine And Related Rash    Rxn to IV morphine; has never had PO morphine  . Sulfonamide Derivatives Rash     Assessment/Plan:  1. Orthostatic hypotension: BP improving to 80s/60s in clinic and pt is feeling better. Will increase Northera to 100 mg TID for the next 3 days, then increase to 200 mg TID. Continue to monitor BP at home. Follow up in clinic next Tuesday (4/3) for orthostatics and assessment of symptom improvement. If needed, Northera dose can be titrated to max daily dose of 600 mg TID.  Patient was seen today with [name], PharmD Candidate 2018.   [name], PharmD, CPP, BCACP [facility] [address], [place], [place] [address] Phone: [phone]; Fax: [phone] 05/28/2016 3:31 PM

## 2016-06-04 ENCOUNTER — Telehealth: Payer: Self-pay | Admitting: Internal Medicine

## 2016-06-04 NOTE — Telephone Encounter (Signed)
Patient is calling in regards to Northera medication states that her dosage was increased and she had a negative reaction to the increase. Patient states that over the weekend she a had nausea, dizziness and migraines. Patient went down to the the original dosage and wanted to make you aware, and I scheduled to come in tomorrow. Thanks.

## 2016-06-05 ENCOUNTER — Ambulatory Visit: Payer: Self-pay

## 2016-06-05 NOTE — Progress Notes (Deleted)
Patient ID: Jennifer [name]                 DOB: 02-Jun-1965                      MRN: [id number]     HPI: Jennifer [name] is a 51 y.o. female referred by Dr. Graciela [name] to HTN clinic for Northera (droxidopa) follow up and dose titration. PMH includes dysautonomia with hypotension and tachycardia, POTS, hemachromatosis, s/p MI (11/2015), PVCs, RA, anxiety and depression. She has a history of an episode of syncope without warning, but was not injured. She last saw Dr. Graciela [name] in November 2017 at which time her midodrine was increased to 10 mg TID. 2 months ago, she called and midodrine was increased to 20 mg TID. She reported SBP in the 80s, with HR 130-140s. 6 weeks ago, she called to report that her midodrine was causing itching, leg cramps, and GI upset. She was requiring Benadryl with each dose of midodrine to prevent itching. Her dose was adjusted to 5 mg every 3 hours for 25-30 mg daily. Referred for tilt table testing at [facility], and per Dr. Graciela [name], they recommended that midodrine was driving elevated HRs and suggested Northera (doxidropa) or ivabradine.   Pt was started on Northera 100mg  BID a week ago and tolerated this dose well. She reported an improvement in BP readings to 70-80s systolic. She was instructed to increase her dose to 100mg  TID for 3 days, then to 200mg  TID. Pt called clinic yesterday with reports of nausea, dizziness, and migraines on the higher dose of Northera. She decreased her dose back to 100mg  BID and presents today for follow up.  She has stopped taking her midodrine, and is afraid to take her propranolol because of it decreasing her blood pressure.   With the Northera, she has noticed improvement with increased blood pressures (70s-80s, even up to 110 at a recent appointment). She still has some dizziness and lightheadedness when standing. She raised the head of her bed as directed, but does note that this is uncomfortable. She checks her blood pressure occasionally when laying down,  and has gotten systolics in the 70s - no signs of supine hypertension. She does not wear her abdominal binder or compression stockings usually. She notes that she is trying to lose the weight she gained with the increased dietary sodium recommendation.   Current meds: Northera 100mg  BID (7am, 3-4pm), propranolol 20 mg (PRN tachycardia) - has not been taking d/t concerns over hypotension  Previously tried: Fludrocortisone (migraines); midodrine (discontinued following tilt table testing - tachycardia, itching, leg cramps, and GI upset)  Home BP readings: This morning was ~74/50, and this is much improved over previous readings. It has been in the 90-100s a few times.  Wt Readings from Last 3 Encounters:  11/25/15 136 lb 9.6 oz (62 kg)  08/11/13 130 lb 8.2 oz (59.2 kg)  07/24/13 130 lb (59 kg)   BP Readings from Last 3 Encounters:  11/25/15 (!) 88/61  08/14/13 102/72  07/24/13 94/64   Pulse Readings from Last 3 Encounters:  11/25/15 64  08/14/13 67  07/24/13 62    Renal function: CrCl cannot be calculated (Patient's most recent lab result is older than the maximum 21 days allowed.).  Past Medical History:  Diagnosis Date  . Anxiety   . Arthritis    inactive RA- no meds  . ASD (atrial septal defect)    Closure   . Depression   .  Dysrhythmia    PVCs  . Headache(784.0)    migraines  . Hemochromatosis   . IBS (irritable bowel syndrome)   . Orthostatic syncope   . POTS (postural orthostatic tachycardia syndrome)   . Rheumatoid arthritis(714.0) 02/04/2012    Current Outpatient Prescriptions on File Prior to Visit  Medication Sig Dispense Refill  . clonazePAM (KLONOPIN) 2 MG tablet Take 1 mg by mouth as needed for anxiety.     . cyclobenzaprine (FLEXERIL) 10 MG tablet Take 10 mg by mouth at bedtime as needed for muscle spasms.     Marland Kitchen desvenlafaxine (PRISTIQ) 100 MG 24 hr tablet Take 100 mg by mouth every morning.    . Droxidopa (NORTHERA) 100 MG CAPS Take 200 mg by mouth 3  (three) times daily. 90 capsule   . Methocarbamol (ROBAXIN PO) Take 1 tablet by mouth daily as needed (for firbrimyalgia).    . ondansetron (ZOFRAN-ODT) 8 MG disintegrating tablet Take 8 mg by mouth daily as needed for nausea or vomiting. Inside cheek    . propranolol (INDERAL) 20 MG tablet TAKE 1 TABLET BY MOUTH TWICE DAILY AS NEEDED FOR INCREASE HEART RATE (Patient not taking: Reported on 05/28/2016) 180 tablet 2  . topiramate (TOPAMAX) 100 MG tablet Take 2 tablets (200 mg total) by mouth daily. For mood stabilization 30 tablet 0   No current facility-administered medications on file prior to visit.     Allergies  Allergen Reactions  . Florinef [Fludrocortisone] Other (See Comments)    Severe migraine  . Leflunomide Diarrhea  . Methotrexate Derivatives Hives  . Reglan [Metoclopramide]     Jerky   . Remicade [Infliximab] Hives, Diarrhea and Other (See Comments)    Increase heart rate,decrease blood pressure  . Morphine And Related Rash    Rxn to IV morphine; has never had PO morphine  . Sulfonamide Derivatives Rash     Assessment/Plan:  1. Orthostatic hypotension - Pt intolerant to fludrocortisone (migrains) and midodrine (itching, tachycardia, leg cramps, and GI upset), now intolerant to dose increase of Northera to 200mg  TID. She did tolerate Northera 100mg  BID well. Plan is to titrate Northera as much as pt can tolerate, her dose limit is   Jennifer [name], PharmD, CPP, BCACP [facility] [address], [place], [address] [place] Phone: [phone]; Fax: [phone] 06/05/2016 11:39 AM

## 2016-06-05 NOTE — Telephone Encounter (Signed)
Will address at OV today.

## 2016-06-07 NOTE — Progress Notes (Deleted)
Patient ID: Jennifer [name]                 DOB: 06-29-1965                      MRN: [id number]     HPI: Jennifer [name] is a 51 y.o. female referred by Dr. [name] to HTN clinic for Northera (droxidopa) follow up and dose titration. PMH includes dysautonomia with hypotension and tachycardia, POTS, hemachromatosis, s/p MI (11/2015), PVCs, RA, anxiety and depression. She has a history of an episode of syncope without warning, but was not injured. She last saw Dr. [name] in November 2017 at which time her midodrine was increased to 10 mg TID. 2 months ago, she called and midodrine was increased to 20 mg TID. She reported SBP in the 80s, with HR 130-140s. 6 weeks ago, she called to report that her midodrine was causing itching, leg cramps, and GI upset. She was requiring Benadryl with each dose of midodrine to prevent itching. Her dose was adjusted to 5 mg every 3 hours for 25-30 mg daily. Referred for tilt table testing at [facility], and per Dr. [name], they recommended that midodrine was driving elevated HRs and suggested Northera (doxidropa) or ivabradine.  She was started on Northera 100 mg BID ~4 weeks ago (she takes at 7 am, then at 3-4 pm). She has stopped taking her midodrine, and is afraid to take her propranolol because of it decreasing her blood pressure. With the Northera, she noticed improvement with increased blood pressures (70s-80s, even up to 110 at a recent appointment). She still has some dizziness and lightheadedness when standing. She raised the head of her bed as directed, but does note that this is uncomfortable. She checks her blood pressure occasionally when laying down, and has gotten systolics in the 70s - no signs of supine hypertension. She does not wear her abdominal binder or compression stockings usually. She notes that she is trying to lose the weight she gained with the increased dietary sodium recommendation. At her most recent visit her northera was increased to 100mg  TID for 3 days then  200mg  TID. She has since called complaining of side effects (Nausea, dizziness and migraines) and decreased her dose back to 100mg  BID.   Current meds:  Northera 100 mg BID Propranolol 20 mg (PRN tachycardia) - has not been taking  Previously tried: Fludrocortisone (migraines); midodrine (discontinued following tilt table testing - tachycardia, itching, leg cramps, and GI upset)  Home BP readings:   Wt Readings from Last 3 Encounters:  11/25/15 136 lb 9.6 oz (62 kg)  10/07/15 139 lb (63 kg)  02/08/14 130 lb 9.6 oz (59.2 kg)   BP Readings from Last 3 Encounters:  05/28/16 (!) 76/62  01/04/16 92/86  11/25/15 (!) 88/61   Pulse Readings from Last 3 Encounters:  05/28/16 94  01/04/16 87  11/25/15 64    Renal function: CrCl cannot be calculated (Patient's most recent lab result is older than the maximum 21 days allowed.).  Past Medical History:  Diagnosis Date  . Anxiety   . Arthritis    inactive RA- no meds  . ASD (atrial septal defect)    Closure   . Depression   . Dysrhythmia    PVCs  . Headache(784.0)    migraines  . Hemochromatosis   . IBS (irritable bowel syndrome)   . Orthostatic syncope   . POTS (postural orthostatic tachycardia syndrome)   . Rheumatoid arthritis(714.0) 02/04/2012  Current Outpatient Prescriptions on File Prior to Visit  Medication Sig Dispense Refill  . clonazePAM (KLONOPIN) 2 MG tablet Take 1 mg by mouth as needed for anxiety.     . cyclobenzaprine (FLEXERIL) 10 MG tablet Take 10 mg by mouth at bedtime as needed for muscle spasms.     Marland Kitchen desvenlafaxine (PRISTIQ) 100 MG 24 hr tablet Take 100 mg by mouth every morning.    . Droxidopa (NORTHERA) 100 MG CAPS Take 200 mg by mouth 3 (three) times daily. 90 capsule   . Methocarbamol (ROBAXIN PO) Take 1 tablet by mouth daily as needed (for firbrimyalgia).    . ondansetron (ZOFRAN-ODT) 8 MG disintegrating tablet Take 8 mg by mouth daily as needed for nausea or vomiting. Inside cheek    . propranolol  (INDERAL) 20 MG tablet TAKE 1 TABLET BY MOUTH TWICE DAILY AS NEEDED FOR INCREASE HEART RATE (Patient not taking: Reported on 05/28/2016) 180 tablet 2  . topiramate (TOPAMAX) 100 MG tablet Take 2 tablets (200 mg total) by mouth daily. For mood stabilization 30 tablet 0   No current facility-administered medications on file prior to visit.     Allergies  Allergen Reactions  . Florinef [Fludrocortisone] Other (See Comments)    Severe migraine  . Leflunomide Diarrhea  . Methotrexate Derivatives Hives  . Reglan [Metoclopramide]     Jerky   . Remicade [Infliximab] Hives, Diarrhea and Other (See Comments)    Increase heart rate,decrease blood pressure  . Morphine And Related Rash    Rxn to IV morphine; has never had PO morphine  . Sulfonamide Derivatives Rash     Assessment/Plan:  1. Orthostatic hypotension:      Prudence Davidson, PharmD, CPP, BCPS [facility] [address] [place], [place] [address] Phone: [phone]; Fax: [phone] 06/07/2016 1:11 PM

## 2016-06-11 ENCOUNTER — Ambulatory Visit: Payer: Self-pay

## 2016-06-11 ENCOUNTER — Telehealth: Payer: Self-pay | Admitting: Internal Medicine

## 2016-06-11 NOTE — Telephone Encounter (Signed)
New Message     Pt is not able to tolerate the  Droxidopa (NORTHERA) 100 MG CAPS Take 200 mg by mouth 3 (three) times daily.    she can not get out of bed, nausea, dizziness and migraine ,  Please call to advise what to do from here.  It has not raise her heart rate or bp, and her bp is dropping below 90/60  Has had to take  propranolol (INDERAL) 20 MG tablet TAKE 1 TABLET BY MOUTH TWICE DAILY AS NEEDED FOR INCREASE HEART RATE Patient not taking: Reported on 05/28/2016   For the tachycardia

## 2016-06-11 NOTE — Telephone Encounter (Signed)
Will forward to Margaretmary Dys, Pharm D and Dr. [name] to review.

## 2016-06-12 ENCOUNTER — Other Ambulatory Visit: Payer: Self-pay | Admitting: Rheumatology

## 2016-06-12 NOTE — Telephone Encounter (Signed)
Spoke to patient and she reports that she had self titrated her dose back to 200mg  TID and became severely nauseated and dizzy with migraines. She states she took 100mg  TID for 5 days then increased to the 200mg  TID. She feels she tolerated the 100mg  TID better. Will step her back down to 100mg  TID and have scheduled a follow up in HTN clinic in 10 days. She will call if symptoms get worse.   She also asks about the results of her monitor. Advised I will forward to Dr. nurse.   She states appreciation and understanding.

## 2016-06-14 ENCOUNTER — Other Ambulatory Visit: Payer: Self-pay | Admitting: Oncology

## 2016-06-14 DIAGNOSIS — Z862 Personal history of diseases of the blood and blood-forming organs and certain disorders involving the immune mechanism: Secondary | ICD-10-CM

## 2016-06-18 ENCOUNTER — Other Ambulatory Visit: Payer: Self-pay

## 2016-06-22 ENCOUNTER — Other Ambulatory Visit (INDEPENDENT_AMBULATORY_CARE_PROVIDER_SITE_OTHER): Payer: BLUE CROSS/BLUE SHIELD

## 2016-06-22 ENCOUNTER — Ambulatory Visit (INDEPENDENT_AMBULATORY_CARE_PROVIDER_SITE_OTHER): Payer: BLUE CROSS/BLUE SHIELD | Admitting: Pharmacist

## 2016-06-22 VITALS — BP 92/62 | HR 53

## 2016-06-22 DIAGNOSIS — I951 Orthostatic hypotension: Secondary | ICD-10-CM

## 2016-06-22 DIAGNOSIS — Z862 Personal history of diseases of the blood and blood-forming organs and certain disorders involving the immune mechanism: Secondary | ICD-10-CM

## 2016-06-22 NOTE — Patient Instructions (Signed)
DECREASE Northera to 100mg  twice daily  We will call you next week with monitor results and schedule you for follow up visit.

## 2016-06-22 NOTE — Progress Notes (Signed)
Patient ID: Kerensa Nicklas Keinath                 DOB: [birth year]                      MRN: [id number]     HPI: Zykeriah Mathia Eberle is a 51 y.o. female patient of Dr.Klein to HTN clinic for Northera (droxidopa) follow up and dose titration. PMH includes dysautonomia with hypotension and tachycardia, POTS, hemachromatosis, s/p MI (11/2015), PVCs, RA, anxiety and depression. She has a history of an episode of syncope without warning, but was not injured. She last saw Dr. [name] in November 2017 at which time her midodrine was increased to 10 mg TID. 2 months ago, she called and midodrine was increased to 20 mg TID. She reported SBP in the 80s, with HR 130-140s. 6 weeks ago, she called to report that her midodrine was causing itching, leg cramps, and GI upset. She was requiring Benadryl with each dose of midodrine to prevent itching. Her dose was adjusted to 5 mg every 3 hours for 25-30 mg daily. Referred for tilt table testing at [facility], and per Dr. [name], they recommended that midodrine was driving elevated HRs and suggested Northera (doxidropa) or ivabradine.    She was started on Northera 100mg  BID and then titrated to 100mg  TID for 3 days then 200mg  TID. She has since called with symptoms of migraine and nausea. Her dose was decreased back to 100mg  TID as she reported tolerating that better.   She presents today for follow up. Today she states that she still is very nauseated, dizzy, and having migraines more frequently. She states that she has not noticed a decrease in the dizziness or frequency that she gets lightheaded with the TID dosing versus the BID dosing, but is much more nauseated and her migraines are occurring several times a week now. She has avoided taking her propranolol due to her low blood pressure.   She does admit that her pressures have been elevated into the 90s systolic more recently than before , but again she does not believe this has changed with the increase to TID dosing.   She reports that  she has been trying to eat more sodium, but feels bloated in her midsection and believes she has gained about 10 lbs over the last few weeks. She denies shortness of breath, except related to her allergies.   Her primary concern today is her nausea. She has used all of her supply of zofran and is requesting a refill. She is also very concerned about her monitor results as she has not heard from Dr. or .    Current meds:  Northera 100 mg BID Propranolol 20 mg (PRN tachycardia) - has not been taking  Previously tried: Fludrocortisone (migraines); midodrine (discontinued following tilt table testing - tachycardia, itching, leg cramps, and GI upset)  Home BP readings: She states she averages in mid60s/40s, with some readings as high as 90s/70s. Her HR has been running 140s-150s when standing.   Wt Readings from Last 3 Encounters:  11/25/15 136 lb 9.6 oz (62 kg)  10/07/15 139 lb (63 kg)  02/08/14 130 lb 9.6 oz (59.2 kg)   BP Readings from Last 3 Encounters:  06/22/16 92/62  05/28/16 (!) 76/62  01/04/16 92/86   Pulse Readings from Last 3 Encounters:  06/22/16 (!) 53  05/28/16 94  01/04/16 87    Renal function: CrCl cannot be calculated (Unknown ideal weight.).  Past Medical History:  Diagnosis Date  . Anxiety   . Arthritis    inactive RA- no meds  . ASD (atrial septal defect)    Closure   . Depression   . Dysrhythmia    PVCs  . Headache(784.0)    migraines  . Hemochromatosis   . IBS (irritable bowel syndrome)   . Orthostatic syncope   . POTS (postural orthostatic tachycardia syndrome)   . Rheumatoid arthritis(714.0) 02/04/2012    Current Outpatient Prescriptions on File Prior to Visit  Medication Sig Dispense Refill  . clonazePAM (KLONOPIN) 2 MG tablet Take 1 mg by mouth as needed for anxiety.     . cyclobenzaprine (FLEXERIL) 10 MG tablet Take 10 mg by mouth at bedtime as needed for muscle spasms.     Marland Kitchen desvenlafaxine (PRISTIQ) 100 MG 24 hr tablet Take  100 mg by mouth every morning.    . Droxidopa (NORTHERA) 100 MG CAPS Take 100 mg by mouth 2 (two) times daily. 90 capsule   . Methocarbamol (ROBAXIN PO) Take 1 tablet by mouth daily as needed (for firbrimyalgia).    . ondansetron (ZOFRAN-ODT) 8 MG disintegrating tablet Take 8 mg by mouth daily as needed for nausea or vomiting. Inside cheek    . propranolol (INDERAL) 20 MG tablet TAKE 1 TABLET BY MOUTH TWICE DAILY AS NEEDED FOR INCREASE HEART RATE (Patient not taking: Reported on 05/28/2016) 180 tablet 2  . topiramate (TOPAMAX) 100 MG tablet Take 2 tablets (200 mg total) by mouth daily. For mood stabilization 30 tablet 0   No current facility-administered medications on file prior to visit.     Allergies  Allergen Reactions  . Florinef [Fludrocortisone] Other (See Comments)    Severe migraine  . Leflunomide Diarrhea  . Methotrexate Derivatives Hives  . Reglan [Metoclopramide]     Jerky   . Remicade [Infliximab] Hives, Diarrhea and Other (See Comments)    Increase heart rate,decrease blood pressure  . Morphine And Related Rash    Rxn to IV morphine; has never had PO morphine  . Sulfonamide Derivatives Rash    Blood pressure 92/62, pulse (!) 53.   Assessment/Plan: Hypotension: BP remains markedly low; however, she has not been tolerating the Northera well at this point. I believe we will need to titrate her very slowly to ensure we balance intolerable side effects with blood pressure benefit. For now will decrease her back to Northera 100mg  BID as at this time she has not seen an improvement in symptoms/BP with increasing to TID dosing, but has experienced a marked increase in side effects. Plan to remain at this dose for several weeks before attempting another titration.   Rx sent for more Zofran to help with nausea. Will route to Dr. and his nurse, [name] for further follow up on monitor results.   Follow up with blood pressure clinic based on when follow up scheduled with Dr.  Herbert Seta.    Thank you, [name]. Freddie Apley, PharmD  [facility]

## 2016-06-23 LAB — COMPREHENSIVE METABOLIC PANEL
A/G RATIO: 1.5 (ref 1.2–2.2)
ALT: 12 IU/L (ref 0–32)
AST: 13 IU/L (ref 0–40)
Albumin: 4 g/dL (ref 3.5–5.5)
Alkaline Phosphatase: 67 IU/L (ref 39–117)
BUN / CREAT RATIO: 9 (ref 9–23)
BUN: 8 mg/dL (ref 6–24)
Bilirubin Total: <0.2 mg/dL (ref 0.0–1.2)
CHLORIDE: 104 mmol/L (ref 96–106)
CO2: 21 mmol/L (ref 18–29)
Calcium: 9.1 mg/dL (ref 8.7–10.2)
Creatinine, Ser: 0.9 mg/dL (ref 0.57–1.00)
GFR calc non Af Amer: 76 mL/min/{1.73_m2} (ref >59)
GFR, EST AFRICAN AMERICAN: 87 mL/min/{1.73_m2} (ref >59)
GLOBULIN, TOTAL: 2.6 g/dL (ref 1.5–4.5)
Glucose: 89 mg/dL (ref 65–99)
Potassium: 3.9 mmol/L (ref 3.5–5.2)
Sodium: 141 mmol/L (ref 134–144)
TOTAL PROTEIN: 6.6 g/dL (ref 6.0–8.5)

## 2016-06-23 LAB — CBC WITH DIFFERENTIAL/PLATELET
BASOS: 1 %
Basophils Absolute: 0.1 10*3/uL (ref 0.0–0.2)
EOS (ABSOLUTE): 0.1 10*3/uL (ref 0.0–0.4)
EOS: 3 %
HEMATOCRIT: 38 % (ref 34.0–46.6)
Hemoglobin: 12.5 g/dL (ref 11.1–15.9)
Immature Grans (Abs): 0 10*3/uL (ref 0.0–0.1)
Immature Granulocytes: 0 %
Lymphocytes Absolute: 2.4 10*3/uL (ref 0.7–3.1)
Lymphs: 54 %
MCH: 32.6 pg (ref 26.6–33.0)
MCHC: 32.9 g/dL (ref 31.5–35.7)
MCV: 99 fL — AB (ref 79–97)
MONOS ABS: 0.5 10*3/uL (ref 0.1–0.9)
Monocytes: 11 %
NEUTROS ABS: 1.4 10*3/uL (ref 1.4–7.0)
Neutrophils: 31 %
PLATELETS: 164 10*3/uL (ref 150–379)
RBC: 3.84 x10E6/uL (ref 3.77–5.28)
RDW: 13.1 % (ref 12.3–15.4)
WBC: 4.5 10*3/uL (ref 3.4–10.8)

## 2016-06-23 LAB — FERRITIN: FERRITIN: 83 ng/mL (ref 15–150)

## 2016-06-25 ENCOUNTER — Telehealth: Payer: Self-pay | Admitting: *Deleted

## 2016-06-25 ENCOUNTER — Encounter: Payer: Self-pay | Admitting: Pharmacist

## 2016-06-25 MED ORDER — ONDANSETRON 8 MG PO TBDP: 8.0000 mg | ORAL_TABLET | Freq: Every day | ORAL | 0 refills | Status: DC | PRN

## 2016-06-25 NOTE — Telephone Encounter (Signed)
I called and spoke with the patient about her monitor results.

## 2016-06-25 NOTE — Telephone Encounter (Signed)
-----   Message from Levert Feinstein, MD sent at 06/25/2016  8:32 AM EDT ----- Call pt: Jennifer [name] stable at 85 and liver tests remain normal

## 2016-06-25 NOTE — Telephone Encounter (Signed)
Talked to Dr Reece Agar- pt does not need to keep 4/30 appt but needs yearly labs. Called pt back - informed of Dr Timoteo Expose response.

## 2016-06-25 NOTE — Telephone Encounter (Signed)
Pt called / informed "ferritin stable at 83 and liver tests remain normal " per Dr Cyndie Chime. She wants to know if she needs to keep appt on Monday the 30th?

## 2016-07-02 ENCOUNTER — Encounter: Payer: Self-pay | Admitting: Oncology

## 2016-07-02 ENCOUNTER — Telehealth: Payer: Self-pay | Admitting: Internal Medicine

## 2016-07-02 NOTE — Telephone Encounter (Signed)
New Message  Pt call requesting to speak with RN. Pt wants Rn to know with medication her bp is 64/50 and without medication it is 56/40. Please call back to discuss id  Needed.

## 2016-07-02 NOTE — Telephone Encounter (Signed)
Reviewed BP readings with Dr. [name]- he will need to review options for the patient a little further as she is currently on Northera, has been on florinef, been on proamitine and midodrine. She has had side effects with each drug (migraines, severe headaches, nausea, GI upset, itching).  I have spoken with the patient and notified her that Dr. [name] will need to review options a little further. She is currently not feeling any worse. She states she is wearing abdominal support.   She is scheduled to follow up with Dr. [name] on 07/18/16. I will call her back with further recommendations.  She is agreeable.

## 2016-07-10 NOTE — Telephone Encounter (Signed)
Reviewed with Dr. [name]- no further changes- will reassess at her office visit next week.

## 2016-07-17 ENCOUNTER — Encounter: Payer: Self-pay | Admitting: Internal Medicine

## 2016-07-18 ENCOUNTER — Encounter: Payer: Self-pay | Admitting: Internal Medicine

## 2016-07-18 ENCOUNTER — Ambulatory Visit (INDEPENDENT_AMBULATORY_CARE_PROVIDER_SITE_OTHER): Payer: BLUE CROSS/BLUE SHIELD | Admitting: Internal Medicine

## 2016-07-18 VITALS — BP 99/72 | HR 106 | Ht 67.0 in | Wt 144.0 lb

## 2016-07-18 DIAGNOSIS — I471 Supraventricular tachycardia, unspecified: Secondary | ICD-10-CM

## 2016-07-18 DIAGNOSIS — R000 Tachycardia, unspecified: Secondary | ICD-10-CM | POA: Diagnosis not present

## 2016-07-18 DIAGNOSIS — G901 Familial dysautonomia [Riley-Day]: Secondary | ICD-10-CM

## 2016-07-18 DIAGNOSIS — G909 Disorder of the autonomic nervous system, unspecified: Secondary | ICD-10-CM | POA: Diagnosis not present

## 2016-07-18 LAB — TSH: TSH: 3.63 u[IU]/mL (ref 0.450–4.500)

## 2016-07-18 LAB — CORTISOL: Cortisol: 5.8 ug/dL

## 2016-07-18 NOTE — Patient Instructions (Addendum)
Medication Instructions:  Your physician has recommended you make the following change in your medication:  STOP northera    Labwork: LABS TODAY: Cortisol, TSH  Testing/Procedures: None ordered  Follow-Up: Your physician recommends that you schedule a follow-up appointment in: 4 months with Dr. [name]    Any Other Special Instructions Will Be Listed Below (If Applicable).     If you need a refill on your cardiac medications before your next appointment, please call your pharmacy.

## 2016-07-18 NOTE — Progress Notes (Signed)
Patient Care Team: [name], MD as PCP - General (Family Medicine) Levert Feinstein, MD as Consulting Physician (Oncology) Duke Salvia, MD as Consulting Physician (Cardiology)    HPI  Jennifer [name] is a 51 y.o. female Seen in followup for dysautonomia manifested by hypotension and tachy palpitations; she has postural tachycardia and document SVT  She suffers both for hemachromatosis as well as seronegative rheumatoid arthritis. The former is followed by Ottis Stain -MD and last report was good.   She also has primary and secondary anxiety and depression.There has been significant psychosocial stress   She has previously been treated with Florinef and ProAmatine the former she did not tolerate because of migraines the latter was discontinued following tilt table testing at [facility] where she had predominant tachycardia; at that juncture her blood pressures were better and low-dose beta blockers were attempted. She failed to tolerate these.  8/17 Echo EF normal  8/17 Holter  personally  reveiwed  Mean HR 79 min 56/max 145  1% PACs  She has been having significant tachycardia HR >140 @ home with BP in the 50-70sys at home  We started northera but uptitration past 100 mg tid was limited by N.V and HA  CBC and CMET normal 5/18;  TSH 9/17 normal  Cortisol 22 2014   fluid intake replete  Sodium added but not supplemented   No exercise  Not able to raise head of bed              Past Medical History:  Diagnosis Date  . Anxiety   . Arthritis    inactive RA- no meds  . ASD (atrial septal defect)    Closure   . Depression   . Dysrhythmia    PVCs  . Headache(784.0)    migraines  . Hemochromatosis   . IBS (irritable bowel syndrome)   . Orthostatic syncope   . POTS (postural orthostatic tachycardia syndrome)   . Rheumatoid arthritis(714.0) 02/04/2012    Past Surgical History:  Procedure Laterality Date  . CHOLECYSTECTOMY  1989  . DILATION AND CURETTAGE OF  UTERUS  2004  . LAPAROSCOPIC TUBAL LIGATION  10/26/2010   Procedure: LAPAROSCOPIC TUBAL LIGATION;  Surgeon: Zelphia Cairo;  Location: [facility];  Service: Gynecology;  Laterality: Bilateral;  . LAPAROSCOPY    . PATENT FORAMEN OVALE CLOSURE  2008    Current Outpatient Prescriptions  Medication Sig Dispense Refill  . clonazePAM (KLONOPIN) 2 MG tablet Take 1 mg by mouth as needed for anxiety.     . cyclobenzaprine (FLEXERIL) 10 MG tablet Take 10 mg by mouth at bedtime.     Marland Kitchen desvenlafaxine (PRISTIQ) 100 MG 24 hr tablet Take 100 mg by mouth every morning.    . Droxidopa (NORTHERA) 100 MG CAPS Take 100 mg by mouth 2 (two) times daily. 90 capsule   . ondansetron (ZOFRAN-ODT) 8 MG disintegrating tablet Take 1 tablet (8 mg total) by mouth daily as needed for nausea or vomiting. Inside cheek 30 tablet 0  . propranolol (INDERAL) 20 MG tablet TAKE 1 TABLET BY MOUTH TWICE DAILY AS NEEDED FOR INCREASE HEART RATE 180 tablet 2   No current facility-administered medications for this visit.     Allergies  Allergen Reactions  . Florinef [Fludrocortisone] Other (See Comments)    Severe migraine  . Leflunomide Diarrhea  . Methotrexate Derivatives Hives  . Reglan [Metoclopramide]     Jerky   . Remicade [Infliximab] Hives, Diarrhea and Other (See Comments)  Increase heart rate,decrease blood pressure  . Morphine And Related Rash    Rxn to IV morphine; has never had PO morphine  . Sulfonamide Derivatives Rash    Review of Systems negative except from HPI and PMH  Physical Exam BP 99/72   Pulse (!) 106   Ht 5\' 7"  (1.702 m)   Wt 144 lb (65.3 kg)   SpO2 97%   BMI 22.55 kg/m  Well developed and nourished in no acute distress HENT normal Neck supple with JVP-flat Clear Regular rate and rhythm, no murmurs or gallops Abd-soft with active BS No Clubbing cyanosis edema Skin-warm and dry A & Oriented  Grossly normal sensory and motor function  ECG Personally reviewed  Sinus@  95 13/07/35  Assessment and  Plan  Dysautonomia with hypotension and tachycardia  Rheumatoid arthritis  Depression-primary and secondary  Arachnodactyly  SVT  Ehlers-Danlos 3   I suspect that she has Ehlers-Danlos with her arachnodactyly and dysautonomia. I will have to look into whether there any specific implications as to therapy. I will be talking with Dr. 15/07/35 later this week and will ask him if he has a specific thoughts as to treatment for her hypotension.  Pharmacological augmentation of Blood pressure has not worked. We will discontinue her therapy.   We have reviewed the issues of compressive clothing.  We spent more than 50% of our >45  min visit in face to face counseling regarding the above

## 2016-08-03 ENCOUNTER — Telehealth: Payer: Self-pay | Admitting: Internal Medicine

## 2016-08-03 NOTE — Telephone Encounter (Signed)
New Message     Did you get the sample medication for her? Pt did not know name of medicine

## 2016-08-03 NOTE — Telephone Encounter (Signed)
I spoke with the patient.  I advised her I was unsure of what sample of medication she was referring to- she stated it was a medication for her HR, but without BP effects.  I advised her I would message Dr. [name] as this may be corlanor.  I will call her back.

## 2016-08-07 MED ORDER — IVABRADINE HCL 5 MG PO TABS: 5.0000 mg | ORAL_TABLET | Freq: Two times a day (BID) | ORAL | Status: DC

## 2016-08-07 NOTE — Telephone Encounter (Signed)
Late entry- I heard back from Dr. [name] on 08/03/16- he did advise that the patient try Corlanor 5 mg BID. I spoke with Mindy in the refill department and she was pulling samples for the patient. These were placed at the front desk and the patient was made aware she could come pick these up at her convenience.

## 2016-08-09 ENCOUNTER — Encounter: Payer: Self-pay | Admitting: Internal Medicine

## 2016-08-09 ENCOUNTER — Telehealth: Payer: Self-pay | Admitting: Internal Medicine

## 2016-08-09 NOTE — Telephone Encounter (Signed)
New message      Pt has been in afib for the last 2 days.  Please call

## 2016-08-09 NOTE — Telephone Encounter (Signed)
I called and spoke with the patient. I advised her Dr. [name] has never known her to be in a-fib, only SVT. She states she is seeing this on her Alive Cor monitor both yesterday and today, intermittently. HR are around 100 bpm- she is on corlanor. She is currently down in [place] at her son's college orientation.  She will not be back until around 3 pm tomorrow.  I advised her that she has MyChart access- if she can attach the AliveCor tracings and forward them to [place]- I will have Dr. [name] review. She will try to do this.

## 2016-08-09 NOTE — Telephone Encounter (Signed)
Patient calling for Jennifer [name] - Patient unable to access mychart but can send via email with cardia app    Please send email to  [email or web address] to confirm where she can send ekg

## 2016-08-10 NOTE — Telephone Encounter (Signed)
Patient emailed her Alive Cor tracings to me- reviewed with Dr. [name]- per Dr. [name], the patient is in a NSR with PAC's. I have notified the patient.

## 2016-08-23 ENCOUNTER — Telehealth: Payer: Self-pay | Admitting: Internal Medicine

## 2016-08-23 NOTE — Telephone Encounter (Signed)
New Message     Patient wants to know if she can take these 2 medications together  propranolol (INDERAL) 20 MG tablet TAKE 1 TABLET BY MOUTH TWICE DAILY AS NEEDED FOR INCREASE HEART RATE   ivabradine (CORLANOR) 5 MG TABS tablet Take 1 tablet (5 mg total) by mouth 2 (two) times daily with a meal.     Her bp is 70/50 heart rate 130 this has been going on for a couple days

## 2016-08-24 MED ORDER — IVABRADINE HCL 7.5 MG PO TABS
7.5000 mg | ORAL_TABLET | Freq: Two times a day (BID) | ORAL | 6 refills | Status: DC
Start: 2016-08-24 — End: 2017-04-02

## 2016-08-24 NOTE — Telephone Encounter (Signed)
I spoke with the patient- she is aware of Dr. Odessa Fleming recommendations to increase corlanor to 7.5 mg BID. She would like this RX sent in to [facility] on [address].

## 2016-08-24 NOTE — Telephone Encounter (Signed)
Reviewed with Dr. [name]- recommendation is to increase corlanor to 7.5 mg BID. I left a message for the patient to call.

## 2016-08-27 ENCOUNTER — Telehealth: Payer: Self-pay | Admitting: Internal Medicine

## 2016-08-27 ENCOUNTER — Telehealth: Payer: Self-pay

## 2016-08-27 NOTE — Telephone Encounter (Signed)
**Note De-identified  Obfuscation** I have done a PA for Corlanor through covermymeds. Awaiting response. 

## 2016-08-27 NOTE — Telephone Encounter (Signed)
The pt is aware that I have done the PA on her Corlanor through covermymeds. She is also aware that a denial is expected as she does not have CHF but that I plan to appeal if she is denied. She verbalized understanding and thanked me for my help.

## 2016-08-27 NOTE — Telephone Encounter (Signed)
°  New Prob   Prior authorization needed for for ivabradine (CORLANOR) 7.5 MG TABS tablet. Pt is insured with [employer] of [place].

## 2016-08-30 NOTE — Telephone Encounter (Signed)
PA for [facility] denied due to "no dx of chronic heart failure and or left ventricular EF less than or equal to 35%".  APPEAL sent to [facility].

## 2016-08-31 ENCOUNTER — Telehealth: Payer: Self-pay | Admitting: Internal Medicine

## 2016-08-31 NOTE — Telephone Encounter (Signed)
F/U call: Patient calling, states that she received a letter from [facility] on 08-28-16. The letter stated  that she was denied, patient is not sure what the denial was in regards to. Please call, thanks.

## 2016-08-31 NOTE — Telephone Encounter (Signed)
**Note De-Identified  Obfuscation** The pt is advised that her Corlanor has been denied by [facility] and that an appeal was done yesterday. She verbalized understanding.  She states that she only has enough Corlanor to last until Wednesday next week (09/05/16). I have advised her that if we have not received an answer from [facility] by then the office will provide her more samples of Corlanor until a decision is made. She thanked me for my assistance.

## 2016-08-31 NOTE — Telephone Encounter (Signed)
Patient calling the office for samples of medication:   1.  What medication and dosage are you requesting samples for? Corlanor, patient states that prescription is for 7.5 mg but she received samples for 5 mg  2.  Are you currently out of this medication? patient is running and waiting on prior auth

## 2016-08-31 NOTE — Telephone Encounter (Signed)
Samples pulled for the patient  Corlanor 5 mg Lot: [id number] Exp: 7/21 # 4 bottles   Placed at checkout for the patient.  She states she has only been taking 5 mg twice daily as the pharmacist told her not to cut the pills in half even though they are scored. Discussed with Dr. [name] as our pharmacist are gone for the day. He was unsure about cutting the pills, but advised to continue 5 mg BID until prior auth can be obtained. The patient voices understanding.

## 2016-09-06 ENCOUNTER — Telehealth: Payer: Self-pay | Admitting: Internal Medicine

## 2016-09-06 ENCOUNTER — Encounter: Payer: Self-pay | Admitting: Internal Medicine

## 2016-09-06 NOTE — Telephone Encounter (Signed)
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

## 2016-09-06 NOTE — Telephone Encounter (Signed)
Spoke with pt, aware there is testing for EDS. Will send information to dr Edison Simon scheduler to contact the patient will appointment.

## 2016-09-10 NOTE — Telephone Encounter (Signed)
I called [employer] today and s/w Britt Boozer concerning the Corlanor appeal that was faxed to them on 08/30/16 by Addison Lank, RN from this office. Per Britt Boozer an appeal takes up to 30 days and that she does not have a decision yet and to give it more time.  Will continue to wait for decision.

## 2016-09-13 ENCOUNTER — Telehealth: Payer: Self-pay | Admitting: Internal Medicine

## 2016-09-13 NOTE — Telephone Encounter (Signed)
I called and spoke with the patient regarding the denial for Corlanor from [employer] that Dr. [name] received. She states she received a copy as well. I advised her that since the appeal has to be initiated by her, Dr. [name] has done a letter on her behalf to submit with the appeal. The patient is appreciative of this and agreeable with having this mailed to her.

## 2016-09-25 ENCOUNTER — Telehealth: Payer: Self-pay | Admitting: Internal Medicine

## 2016-09-25 NOTE — Telephone Encounter (Signed)
I left a message for the patient to call with contact info for MD for appeal for [employer].   After leaving message- I did find the appeal info in her chart: Urgent MD request may be faxed to: [phone].  Will send last office note with most recent letter of support done by Dr. [name] to [facility].

## 2016-09-25 NOTE — Telephone Encounter (Signed)
New message    Pt is calling for RN. She said she is trying to do the appeal for Corlanor but is having trouble with [employer]. She has called them 3 times. She said she was told the Doctor can do it for her. She said she only has 10 days left and if she does it it will take 30 days. She said an expedited appeal can be done if Dr. [name] does it.

## 2016-09-25 NOTE — Telephone Encounter (Signed)
Information from Dr. [name] faxed to [facility]- "urgent" appeals at [phone]. Confirmation received.

## 2016-09-27 ENCOUNTER — Ambulatory Visit: Payer: BLUE CROSS/BLUE SHIELD | Admitting: Genetic Counselor

## 2016-10-01 ENCOUNTER — Telehealth: Payer: Self-pay | Admitting: Internal Medicine

## 2016-10-01 NOTE — Telephone Encounter (Signed)
I spoke with [employer]. They confirmed they have received urgent appeal information sent on 7/24 and nothing else is needed at this time.  I spoke with pt and gave her this information. Pt is asking if Dr. [name] feels she definitely needs a medical alert bracelet

## 2016-10-01 NOTE — Telephone Encounter (Signed)
New Message    The pharmacist from [facility] said that you have to call them and do a peer to peer , before they will grant approval for medication  , call the pharmacist number on the back of her insurance card.  The pharmacist said that she need to get a medical bracelet , does she really need it ?

## 2016-10-01 NOTE — Telephone Encounter (Signed)
**Note De-Identified  Obfuscation** I called [employer] and s/w Maggie Schwalbe. To check status of this appeal. Per Vertell Limber mailed the pt an Appeals member authorization representation form on 7/25 and they are waiting on form to be returned. Cherylann Ratel states that there is a portion on the form that the physicians office will need to address and Dr [name] will need to sign.  I called the pt and she stated that she did receive a letter from [facility] on Saturday and that she will fill it out then bring it to this office so I can fill out the provider part, have Dr [name] sign then I will fax it back to [facility].   The pt states that she will bring the form to the office tomorrow. She is requesting samples of Corlanor as she is almost out. I advised her that the Corlanor samples will be in out front office and that she can pick up tomorrow when she brings form to the office.  She verbalized understanding and thanked me for my help.

## 2016-10-09 NOTE — Consult Note (Signed)
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 I explained to her that currently there is no genetic diagnostic test for hEDS. She has read extensively about EDS and is aware of there being no genetic test for hEDS. 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   She was recently diagnosed with hEDS, when she had a mild heart attack last year at age 38. Prior to this, she informs me having laproscopy at age 59 with an unusual finding of varicose veins on her uterus. She has had threenormal pregnancies. At 40 she underwent a PFO closure at [facility] and, also began experiencing orthostatic intolerance. She states that she has fainted without warning several times and had to quit her job due to this. At 44 she was diagnosed with rheumatoid arthritis and postural tachycardial orthostatic syndrome. She states that she has arrhtymias since her early 19s and that she also bruises very easily.   Family history Jennifer [name] (III.3) is the youngest of three siblings. There is no overt history of hEDS in her family. However, there is a significant family history of HFE. She reports that her dad (II.4) is a carrier for HFE and that her mom (II.5) was diagnosed with HFE. Jennifer [name] and her siblings have HFE that was confirmed by genetic testing by their hematologist. Her mom's brother (II.7) passed away from liver damage caused by HFE. She states that he had one liver transplant and was scheduled for another one, but passed away before the second transplant could be performed.  Impression and Plans We discussed the autosomal recessive inheritance pattern of HFE. Upon confirming her diagnosis of hEDS with Dr. [name], I informed her that genetic  testing is not warranted as a gene has not yet been implicated for this condition. She understood the relevance of this.     Sidney Ace, Ph.D, [facility]

## 2016-10-16 NOTE — Telephone Encounter (Signed)
Reviewed Medical alert bracelet with Dr. [name]- he has no strong opinion on this.  Letter mailed to patient regarding this, but also info on medical alerts bracelets enclosed if she chooses to use this.

## 2016-10-18 ENCOUNTER — Telehealth: Payer: Self-pay | Admitting: Internal Medicine

## 2016-10-18 NOTE — Telephone Encounter (Signed)
Jennifer [name] is calling because her Corlenor , she only has 10 pills left and she takes 2 day and she is not sure where the appeal for the medication is with her insurance company . She states that she will take 1 a day until she hears from you . She will be out of town on tomorrow but please call her back at [phone]. Thanks

## 2016-10-22 NOTE — Telephone Encounter (Signed)
**Note De-Identified  Obfuscation** The pt states that she dropped off a manilla folder off at the front office on or around the 31st of July. I never received the envelope as no one in the office is aware of envelope.  I called [employer] and s/w Burna Mortimer. Per Burna Mortimer the pt has received a 2nd denial because the pt does not have a DX of CHF and her EF is greater than 35%.  Will forward message to Dr [name] and his nurse for advisement.

## 2016-10-22 NOTE — Telephone Encounter (Signed)
**Note De-Identified  Obfuscation** The pt is advised that I have left her 2 bottles of Corlanor in the front office and that she may pick up at her convenience.  Also, I spoke with Burna Mortimer at [facility] this morning concerning the pts Corlanor PA. Per Burna Mortimer the pt has been denied for the 2nd time because she does not have a DX of HF and her EF is greater than 35%. I have sent a message to Dr [name] letting him know this.

## 2016-11-12 ENCOUNTER — Telehealth: Payer: Self-pay | Admitting: Internal Medicine

## 2016-11-12 NOTE — Telephone Encounter (Signed)
Did Dr. Odessa Fleming letter from July 5 get sent to [facility]?

## 2016-11-12 NOTE — Telephone Encounter (Signed)
New message    Pt is calling asking for a call back. She is checking on cornalor approval.

## 2016-11-13 NOTE — Telephone Encounter (Addendum)
I called [employer] and was advised by Britt Boozer that they have received all they need except signed form from pt allowing providers office permission to appeal decision on her behalf. Per Britt Boozer they will fax that form to me at [phone] within the next 30 mins.  I have left a message on the pts VM asking her to call me back.

## 2016-11-13 NOTE — Telephone Encounter (Signed)
**Note De-Identified  Obfuscation** The pt states that the form that she needs to sign is available online. She states that she will print the form, sign it and bring it to me tomorrow so I can fax to [facility] asap.

## 2016-11-14 NOTE — Telephone Encounter (Signed)
The pt called to let me know that she has signed and mailed back her form to [facility] allowing [place] to appeal Corlanor on her behalf. Awaiting response from [facility].

## 2016-11-27 ENCOUNTER — Encounter: Payer: Self-pay | Admitting: Internal Medicine

## 2016-11-27 ENCOUNTER — Telehealth: Payer: Self-pay | Admitting: Internal Medicine

## 2016-11-27 ENCOUNTER — Ambulatory Visit (INDEPENDENT_AMBULATORY_CARE_PROVIDER_SITE_OTHER): Payer: BLUE CROSS/BLUE SHIELD | Admitting: Internal Medicine

## 2016-11-27 DIAGNOSIS — R000 Tachycardia, unspecified: Secondary | ICD-10-CM

## 2016-11-27 DIAGNOSIS — I951 Orthostatic hypotension: Secondary | ICD-10-CM

## 2016-11-27 DIAGNOSIS — G909 Disorder of the autonomic nervous system, unspecified: Secondary | ICD-10-CM | POA: Diagnosis not present

## 2016-11-27 DIAGNOSIS — G901 Familial dysautonomia [Riley-Day]: Secondary | ICD-10-CM

## 2016-11-27 DIAGNOSIS — I471 Supraventricular tachycardia: Secondary | ICD-10-CM | POA: Diagnosis not present

## 2016-11-27 NOTE — Telephone Encounter (Signed)
I called [employer] to get an update on the the Colanor appeal. I was on hold for 38 minutes waiting to talk with a representative without success. I opted to press 1 (as advised by computerized message that kept repeating while I was on hold) so I could leave a message for a representative to call me back. As soon as I pressed 1 I got a message stating that the VM has not been set up and for me to call back.  Since I was on hold so long I will call back later this week to check progress of Corlanor appeal.

## 2016-11-27 NOTE — Telephone Encounter (Signed)
Jennifer [name] is calling because on bDr. [name] notes she states that it says she has inactive RA and does not take anything for it , which is incorrect she has severe RA and takes Enbrel for several years . Her RA factor is 219. Please call

## 2016-11-27 NOTE — Patient Instructions (Addendum)
Medication Instructions: Your physician recommends that you continue on your current medications as directed. Please refer to the Current Medication list given to you today.  Labwork: None Ordered  Procedures/Testing: None Ordered  Follow-Up: Your physician recommends that you schedule a follow-up appointment in: 4 MONTHS with Dr. Klein.  If you need a refill on your cardiac medications before your next appointment, please call your pharmacy.   

## 2016-11-27 NOTE — Telephone Encounter (Signed)
I spoke with the patient. She states that her office note is wrong in that she has Severe RA- her rheumatoid factor in the last 18 months was 219.  Per her office note today it is documented that She suffers both for hemachromatosis as well as seronegative rheumatoid arthritis"  Per the patient, this needs to be corrected and another copy of her office note from today mailed to her.  She also reports she takes Enbrel 15 mL injection once a week. I advised her this was not on her medication list, but I will add this to it.   Will forward to Dr. [name] to addend his note regarding her RA.

## 2016-11-27 NOTE — Progress Notes (Addendum)
Patient Care Team: [name], MD as PCP - General (Family Medicine) Levert Feinstein, MD as Consulting Physician (Oncology) Duke Salvia, MD as Consulting Physician (Cardiology)    HPI  Jennifer [name] is a 51 y.o. female Seen in followup for dysautonomia manifested by hypotension and tachy palpitations; she has postural tachycardia and document SVT  She suffers both for hemachromatosis as well as  rheumatoid arthritis for which she takes embrel The former is followed by Ottis Stain -MD and last report was good.   She also has primary and secondary anxiety and depression.There has been significant psychosocial stress   She has previously been treated with Florinef and ProAmatine the former she did not tolerate because of migraines the latter was discontinued following tilt table testing at [facility] where she had predominant tachycardia; at that juncture her blood pressures were better and low-dose beta blockers were attempted. She failed to tolerate these.  8/17 Echo EF normal  8/17 Holter  personally  reveiwed  Mean HR 79 min 56/max 145  1% PACs  She has been having significant tachycardia HR >140 @ home with BP in the 50-70sys at home  We started northera but uptitration past 100 mg tid was limited by N.V and HA  Given tachycardia with low blood pressure, not a candidate for beta blockers, we tried corlanor. We have tried to get approval from her insurance company without success here to date.  Volume is replete. Sodium is replete.  Continues to struggle with significant fatigue brain for sleep disturbance.  Orthostatic intolerance.  CBC and CMET normal 5/18;  TSH 9/17 normal  Cortisol 22 2014                Past Medical History:  Diagnosis Date  . Anxiety   . Arthritis    inactive RA- no meds  . ASD (atrial septal defect)    Closure   . Depression   . Dysrhythmia    PVCs  . Headache(784.0)    migraines  . Hemochromatosis   . IBS (irritable bowel  syndrome)   . Orthostatic syncope   . POTS (postural orthostatic tachycardia syndrome)   . Rheumatoid arthritis(714.0) 02/04/2012    Past Surgical History:  Procedure Laterality Date  . CHOLECYSTECTOMY  1989  . DILATION AND CURETTAGE OF UTERUS  2004  . LAPAROSCOPIC TUBAL LIGATION  10/26/2010   Procedure: LAPAROSCOPIC TUBAL LIGATION;  Surgeon: Zelphia Cairo;  Location: [facility];  Service: Gynecology;  Laterality: Bilateral;  . LAPAROSCOPY    . PATENT FORAMEN OVALE CLOSURE  2008    Current Outpatient Prescriptions  Medication Sig Dispense Refill  . clonazePAM (KLONOPIN) 2 MG tablet Take 1 mg by mouth as needed for anxiety.     . cyclobenzaprine (FLEXERIL) 10 MG tablet Take 10 mg by mouth at bedtime.     Marland Kitchen desvenlafaxine (PRISTIQ) 100 MG 24 hr tablet Take 100 mg by mouth every morning.    . ivabradine (CORLANOR) 7.5 MG TABS tablet Take 1 tablet (7.5 mg total) by mouth 2 (two) times daily with a meal. 60 tablet 6  . ondansetron (ZOFRAN-ODT) 8 MG disintegrating tablet Take 1 tablet (8 mg total) by mouth daily as needed for nausea or vomiting. Inside cheek 30 tablet 0  . propranolol (INDERAL) 20 MG tablet TAKE 1 TABLET BY MOUTH TWICE DAILY AS NEEDED FOR INCREASE HEART RATE 180 tablet 2   No current facility-administered medications for this visit.     Allergies  Allergen Reactions  . Florinef [Fludrocortisone] Other (See Comments)    Severe migraine  . Leflunomide Diarrhea  . Methotrexate Derivatives Hives  . Reglan [Metoclopramide]     Jerky   . Remicade [Infliximab] Hives, Diarrhea and Other (See Comments)    Increase heart rate,decrease blood pressure  . Morphine And Related Rash    Rxn to IV morphine; has never had PO morphine  . Sulfonamide Derivatives Rash    Review of Systems negative except from HPI and PMH  Physical Exam There were no vitals taken for this visit. Well developed and nourished in no acute distress HENT normal Neck supple with JVP-flat Clear Regular  rate and rhythm, no murmurs or gallops Abd-soft with active BS No Clubbing cyanosis edema Skin-warm and dry A & Oriented  Grossly normal sensory and motor function  ECG Personally reviewed  Sinus@ 85 13/07/36 Assessment and  Plan  Dysautonomia with hypotension and tachycardia  Rheumatoid arthritis  Depression-primary and secondary  Arachnodactyly  SVT  Ehlers-Danlos 3/ Joint hypermobility  No interval SVT.  We have been unable to gain coverage for Ivabradine for control of her sinus tachycardia not withstanding systolic blood pressures in the 60s-70s.  She has been able to tolerate ProAmatine more recently without aggravation of her chronic migraine headaches. We will continue it.  Given her disability and difficulty getting around, we will give her handicap sticker.  We discussed ongoing recommendations regarding exercise either recombinant or the pool.

## 2016-11-28 NOTE — Telephone Encounter (Signed)
Done

## 2016-11-29 NOTE — Telephone Encounter (Signed)
Updated office note mailed to the patient.

## 2016-11-30 ENCOUNTER — Encounter: Payer: Self-pay | Admitting: Internal Medicine

## 2016-12-03 NOTE — Telephone Encounter (Signed)
I have completed a [employer] level one provider appeal form for Corlanor. I have placed it in Dr Koren Bound mail bin awaiting his signature.

## 2016-12-20 ENCOUNTER — Telehealth: Payer: Self-pay | Admitting: *Deleted

## 2016-12-20 NOTE — Telephone Encounter (Signed)
Faxed to [employer] the level 1 provider appeal sheet and also the release form from patient given Dr [name] permission to represent her.

## 2017-01-14 ENCOUNTER — Telehealth: Payer: Self-pay | Admitting: Internal Medicine

## 2017-01-14 NOTE — Telephone Encounter (Signed)
New Message  Pt c/o medication issue:  1. Name of Medication: Corlanor   2. How are you currently taking this medication (dosage and times per day)? 7.5mg   3. Are you having a reaction (difficulty breathing--STAT)? no  4. What is your medication issue? Per pt states the medication is not working for her and pt would like to discuss. Please call back to discuss

## 2017-01-14 NOTE — Telephone Encounter (Signed)
Patient calling to let Dr. [name] know that she had been taking the Corlanor samples, but has only been taking 5 mg BID since she only had 2 weeks of samples and her insurance will not cover it. She states that her BP has been as low as 56/40. She states that she took midodrine 10 mg Q4H in the past that caused migraines. She states that she has stopped taking the Corlanor now and has restarted taking midodrine at a lower dose to try and avoid the migraines. She is taking midodrine 5 mg Q4H. She states that her BP has now been 80s/50s. HR has been as high as 160, but averages 130. The patient states that her BP has been low like this for some time and she is kind of used to it but it makes her nervous when systolics are in the 50s. She states that she is not sure if the Corlanor is the best option or not. She states that she has been drinking plenty of fluids and has been eating a lot of salt. Patient states that she feels okay right now but wanted to make Dr. [name] aware and see if he had any additional recommendations. Made patient aware that the information will be forwarded to Dr. [name] for review and recommendation.

## 2017-01-28 NOTE — Telephone Encounter (Signed)
Follow Up Call:  Jennifer [name] is calling to follow up on her call from 01/14/17. Please cal

## 2017-01-28 NOTE — Telephone Encounter (Signed)
Informed patient the message was sent/reviewed by Dr. [name] and there have been no further recommendations as of yet.   She provided this update: has not restarted Corlanor and on Friday got a denial letter from insurance.  She cut back further on midodrine to 5 mg every 4 hours x 2 doses then usually stops for the day due to it causing migraines.  By then her son is coming in from school and she has to do homework, etc and has already taken Excedrin migraine because she can feel the headache coming on.   Taking a lot of water and sodium.   BPs are about same as previously noted.   Said she knows Dr. [name] is running out of options and is probably thinking this over.  She is aware I will discuss with him later today and then someone will give her a call back today or tomorrow with any update.

## 2017-01-28 NOTE — Telephone Encounter (Signed)
Spoke w pt  BP 50-60s with increase to 80s with mitodrine, limited 2/2 severe HA HR 150s and no improvement with corlanor, denied by [employer] Eating salt and fluids but unable to tolerate salt tabs in the past, recommended thermatabs NUUN or vytassium as alternatives Better with compressive clothing  Thought might try referral to [facility]

## 2017-01-31 ENCOUNTER — Telehealth: Payer: Self-pay | Admitting: *Deleted

## 2017-01-31 NOTE — Telephone Encounter (Signed)
Received letter from [facility] regarding an appeal for patients CORLANOR. The NOTICE OF FINAL INTERNAL ADVERSE BENEFIT DETERMINATION has declined approval for the corlanor. Rationale for determination is  No heart failure Ejection fraction >35%

## 2017-01-31 NOTE — Telephone Encounter (Signed)
Called patient to give her the information of the denial from [employer] for her [facility], she had received a letter and was aware. She has spoken to Dr [name] about this and they are working on a plan.

## 2017-02-15 ENCOUNTER — Telehealth: Payer: Self-pay | Admitting: Internal Medicine

## 2017-02-15 NOTE — Telephone Encounter (Signed)
Called patient and informed her that her message would be sent to Dr. [name] and his nurse. Patient verbalized understanding.

## 2017-02-15 NOTE — Telephone Encounter (Signed)
Mrs. Aragones is calling to find out the Status on [facility] . Please call

## 2017-02-18 NOTE — Telephone Encounter (Signed)
H Can you help me rmmbr tomorrow to download the [facility] referral forms for Jennifer [name] THX steve

## 2017-02-21 NOTE — Telephone Encounter (Signed)
Referral form printed. Currently working on completing this and getting her paper work together to send off.

## 2017-03-07 NOTE — Telephone Encounter (Signed)
Referral form and records (113 pages total) faxed to [facility]. [phone]- confirmation received.

## 2017-03-13 ENCOUNTER — Telehealth: Payer: Self-pay | Admitting: Internal Medicine

## 2017-03-13 NOTE — Telephone Encounter (Signed)
Iantha Fallen can tell her that HM has sent the referral form off to [facility] we can try and get number for followup

## 2017-03-13 NOTE — Telephone Encounter (Signed)
New Message   Patient is calling about a referral that she requested to be sent to [facility]. Please call.

## 2017-03-13 NOTE — Telephone Encounter (Signed)
Spoke with patient regarding her last visit with Dr. [name] ( 11/27/2016 ).  She is a "POTS" patient and apparently was told that she would be referred to [facility], although hasn't heard a thing. She is scheduled to see Dr. [name] on 04/02/2017 to further discuss this.

## 2017-03-14 NOTE — Telephone Encounter (Signed)
Per DPR form, left message for patient that referral was already sent and confirmation fax was received (see 12/14 phone note).   Left phone numbers for patient to call for follow-up: Phone:[phone] [phone] Instructed patient to call back with further questions/concerns.

## 2017-03-15 ENCOUNTER — Telehealth: Payer: Self-pay | Admitting: Internal Medicine

## 2017-03-15 NOTE — Telephone Encounter (Signed)
Called patient who states that she is going to bring the packet that was given to her from [facility] to her appointment with Dr. [name] on 1/29. She states that she is going to need 3 sets of orthostatics and some additional information filled out. Patient made aware that we would make Dr. [name] aware. Patient thanked me for the call.

## 2017-03-15 NOTE — Telephone Encounter (Signed)
Patient calling, states that she received a packet from [facility] and they would like three sets of orthostatics and some other information in regards to patient. Jennifer [name] wanted to give a "heads-up" that she will bring this packet to her appt on 04-02-17.

## 2017-03-15 NOTE — Telephone Encounter (Signed)
Close encounter 

## 2017-04-02 ENCOUNTER — Ambulatory Visit: Payer: BLUE CROSS/BLUE SHIELD | Admitting: Internal Medicine

## 2017-04-02 ENCOUNTER — Encounter: Payer: Self-pay | Admitting: Internal Medicine

## 2017-04-02 VITALS — BP 104/76 | HR 109 | Ht 67.0 in | Wt 154.6 lb

## 2017-04-02 DIAGNOSIS — G901 Familial dysautonomia [Riley-Day]: Secondary | ICD-10-CM | POA: Diagnosis not present

## 2017-04-02 DIAGNOSIS — R000 Tachycardia, unspecified: Secondary | ICD-10-CM

## 2017-04-02 DIAGNOSIS — I951 Orthostatic hypotension: Secondary | ICD-10-CM

## 2017-04-02 DIAGNOSIS — G90A Postural orthostatic tachycardia syndrome (POTS): Secondary | ICD-10-CM

## 2017-04-02 NOTE — Patient Instructions (Addendum)
Medication Instructions:  Your physician recommends that you continue on your current medications as directed. Please refer to the Current Medication list given to you today.  Labwork: None ordered.  Testing/Procedures: None ordered.  Follow-Up: We will wait to hear back from [facility]   Any Other Special Instructions Will Be Listed Below (If Applicable).     If you need a refill on your cardiac medications before your next appointment, please call your pharmacy.

## 2017-04-02 NOTE — Progress Notes (Signed)
Patient Care Team: [name], MD as PCP - General (Family Medicine) Levert Feinstein, MD as Consulting Physician (Oncology) Duke Salvia, MD as Consulting Physician (Cardiology)    HPI  Jennifer [name] is a 52 y.o. female Seen in followup for dysautonomia manifested by hypotension and tachy palpitations; she has postural tachycardia and document SVT  She suffers both for hemachromatosis as well as  rheumatoid arthritis for which she takes embrel The former is followed by Ottis Stain -MD and last report was good.   She also has primary and secondary anxiety and depression.There has been significant psychosocial stress   She has previously been treated with Florinef and ProAmatine the former she did not tolerate because of migraines the latter was discontinued following tilt table testing at [facility] where she had predominant tachycardia; at that juncture her blood pressures were better and low-dose beta blockers were attempted. She failed to tolerate these.  8/17 Echo EF normal  8/17 Holter  personally  reveiwed  Mean HR 79 min 56/max 145  1% PACs  She has been having significant tachycardia HR >140 @ home with BP in the 50-70sys at home  We started northera but uptitration past 100 mg tid was limited by N.V and HA  Given tachycardia with low blood pressure, not a candidate for beta blockers, we tried corlanor. We have tried to get approval from her insurance company without success here to date.  Volume is replete. Sodium is replete. \ she continues to struggle with palpitations both nocturnal as well as during the day.  There was a significant amount of anxiety at home currently as her mother has been diagnosed with pulmonary fibrosis.  Her father's activities are relatively passive.  She also has significant lightheadedness with blood pressure during the day in the 60-70 range.  She uses the ProAmatine only infrequently as she can tolerate it.  If she is having tachycardia and  her blood pressures over 90 she uses low-dose propranolol.  CBC and CMET normal 5/18;  TSH 9/17 normal  Cortisol 22 2014      Past Medical History:  Diagnosis Date  . Anxiety   . Arthritis    inactive RA- no meds  . ASD (atrial septal defect)    Closure   . Depression   . Dysrhythmia    PVCs  . Headache(784.0)    migraines  . Hemochromatosis   . IBS (irritable bowel syndrome)   . Orthostatic syncope   . POTS (postural orthostatic tachycardia syndrome)   . Rheumatoid arthritis(714.0) 02/04/2012    Past Surgical History:  Procedure Laterality Date  . CHOLECYSTECTOMY  1989  . DILATION AND CURETTAGE OF UTERUS  2004  . LAPAROSCOPIC TUBAL LIGATION  10/26/2010   Procedure: LAPAROSCOPIC TUBAL LIGATION;  Surgeon: Zelphia Cairo;  Location: [facility];  Service: Gynecology;  Laterality: Bilateral;  . LAPAROSCOPY    . PATENT FORAMEN OVALE CLOSURE  2008    Current Outpatient Medications  Medication Sig Dispense Refill  . clonazePAM (KLONOPIN) 1 MG tablet Take 1 mg by mouth 2 (two) times daily. Up to 4 times daily prn for anxiety.    . clonazePAM (KLONOPIN) 2 MG tablet Take 1 mg by mouth as needed for anxiety.     . cyclobenzaprine (FLEXERIL) 10 MG tablet Take 10 mg by mouth at bedtime.     Marland Kitchen desvenlafaxine (PRISTIQ) 100 MG 24 hr tablet Take 100 mg by mouth every morning.    Marland Kitchen ENBREL MINI  50 MG/ML SOCT Inject 15 mLs into the muscle once a week.    . ondansetron (ZOFRAN-ODT) 8 MG disintegrating tablet Take 1 tablet (8 mg total) by mouth daily as needed for nausea or vomiting. Inside cheek 30 tablet 0  . propranolol (INDERAL) 20 MG tablet TAKE 1 TABLET BY MOUTH TWICE DAILY AS NEEDED FOR INCREASE HEART RATE 180 tablet 2  . topiramate (TOPAMAX) 100 MG tablet as directed.  4   No current facility-administered medications for this visit.     Allergies  Allergen Reactions  . Florinef [Fludrocortisone] Other (See Comments)    Severe migraine  . Leflunomide Diarrhea  . Methotrexate  Derivatives Hives  . Reglan [Metoclopramide]     Jerky   . Remicade [Infliximab] Hives, Diarrhea and Other (See Comments)    Increase heart rate,decrease blood pressure  . Morphine And Related Rash    Rxn to IV morphine; has never had PO morphine  . Sulfonamide Derivatives Rash    Review of Systems negative except from HPI and PMH  Physical Exam BP 104/76   Pulse (!) 109   Ht 5\' 7"  (1.702 m)   Wt 154 lb 9.6 oz (70.1 kg)   SpO2 97%   BMI 24.21 kg/m  Well developed and nourished in no acute distress HENT normal Neck supple with JVP-flat Clear Regular rate and rhythm, no murmurs or gallops Abd-soft with active BS No Clubbing cyanosis edema Skin-warm and dry A & Oriented  Grossly normal sensory and motor function     Assessment and  Plan  Dysautonomia with hypotension and tachycardia  Rheumatoid arthritis  Depression-primary and secondary  Arachnodactyly  SVT  Ehlers-Danlos 3 Joint hypermobility  No interval SVT.  We have been working on trying to get her seen at .  Forms are in process.  In the interim, she will continue to adjust her medications as best she can.  I suggested she consider propranolol at bedtime for her nocturnal palpitations.  Anxiety is becoming a major issue again.  We spent more than 50% of our >25 min visit in face to face counseling regarding the above

## 2017-04-16 ENCOUNTER — Telehealth: Payer: Self-pay | Admitting: Internal Medicine

## 2017-04-16 NOTE — Telephone Encounter (Signed)
Jennifer [name] is calling to see if Dr. [name] can call [facility] because they told her that it would be 6 mths or more before she can be seen . She is asking that he speak with a physician there about the severely of her condition so she can get in early

## 2017-04-17 NOTE — Telephone Encounter (Signed)
Does she have a contact number

## 2017-04-17 NOTE — Telephone Encounter (Signed)
Called patient. This is the only number patient had [phone]. Informed patient number would be sent to Dr. [name]. Patient thanked [place] for our help.

## 2017-04-23 ENCOUNTER — Encounter: Payer: Self-pay | Admitting: Internal Medicine

## 2017-04-23 NOTE — Progress Notes (Signed)
Additional paperwork (patient portion) that the patient dropped off w/ attending physician statement was faxed to [facility]- attn: Estill Bamberg [phone] confirmation received.  Confirmation received.  Attending physician statement also faxed to [employer] at [phone].  Confirmation received.

## 2017-05-02 ENCOUNTER — Other Ambulatory Visit: Payer: Self-pay | Admitting: Oncology

## 2017-05-20 ENCOUNTER — Encounter: Payer: Self-pay | Admitting: Oncology

## 2017-05-27 ENCOUNTER — Telehealth: Payer: Self-pay | Admitting: Internal Medicine

## 2017-05-27 NOTE — Telephone Encounter (Signed)
Dr. Klein aware 

## 2017-05-27 NOTE — Telephone Encounter (Signed)
[name] is calling because she wanted to let Dr. [name] know that her appt at [facility] is August 13th . Thanks

## 2017-08-13 ENCOUNTER — Telehealth: Payer: Self-pay | Admitting: Internal Medicine

## 2017-08-13 NOTE — Telephone Encounter (Signed)
Per Dr [name], pt can take her midodrine as needed for hypotension. Pt states she took it earlier this morning and it helped bring her BP up and she feels better now. I told her to let us know if she needs anything else. Pt had no additional questions and agreed with plan.

## 2017-08-13 NOTE — Telephone Encounter (Signed)
Pt calls today with c/o low BP readings. Currently her BP is 90/60, however this morning she awoke with a BP of 55/40 and HR 120. Pt states she has not began any new medications, wearing spanks, increased fluids and salt intake. She denies taking propanolol recently but took 10mg  of flexeril before bed. She states she has taken this regularly for the past 6 months. Pt states she has not taken her midodrine for low bp's as of yet. I advised pt to take her midodrine and visit the ER if she sustains SBP < 80 with symptoms. I stated I would discuss this with Dr when he returns to the office today. Pt agrees to plan.

## 2017-08-13 NOTE — Telephone Encounter (Signed)
New Message   Pt c/o BP issue:  1. What are your last 5 BP readings? 55/40  2. Are you having any other symptoms (ex. Dizziness, headache, blurred vision, passed out)? Dizzines, headaches and the feeling that she is going to pass out but she has not.  3. What is your medication issue?    Patient is calling in reference to her POTS. She states that she has been having days of high heart rate and then low BP readings. She is scheduled to go to [facility] on 8/13. Please call.

## 2017-08-21 ENCOUNTER — Other Ambulatory Visit: Payer: Self-pay | Admitting: Internal Medicine

## 2017-08-21 MED ORDER — PROPRANOLOL HCL 20 MG PO TABS: ORAL_TABLET | ORAL | 1 refills | Status: DC

## 2017-08-21 NOTE — Telephone Encounter (Signed)
Pt's medication was sent to pt's pharmacy as requested. Confirmation received.  °

## 2017-08-26 ENCOUNTER — Other Ambulatory Visit: Payer: Self-pay

## 2017-08-26 MED ORDER — PROPRANOLOL HCL 20 MG PO TABS: ORAL_TABLET | ORAL | 2 refills | Status: DC

## 2017-09-19 ENCOUNTER — Telehealth: Payer: Self-pay | Admitting: Internal Medicine

## 2017-09-19 NOTE — Telephone Encounter (Signed)
New Message   Patient is calling because she has been refer to [facility] per Dr. Rennis Golden. She states that she would like to see or speak with Dr. Rennis Golden after her appointment around the 19th of August. Please call to discuss.

## 2017-09-20 NOTE — Telephone Encounter (Signed)
Pt scheduled for 9/27 to review and discuss results from her [facility] visit on 8/13.

## 2017-10-18 ENCOUNTER — Telehealth: Payer: Self-pay | Admitting: Internal Medicine

## 2017-10-18 NOTE — Telephone Encounter (Signed)
New problem    Pt need to speak to nurse concerning getting an EKG. Please call pt.

## 2017-10-18 NOTE — Telephone Encounter (Signed)
Spoke with pt today who recently had her referral visit at [facility] for her POTS clinic. She saw Dr Vilinda Blanks who had several recommendations and findings. He would like to start her on flecainide and mestinon with specific loading instructions for flecainide. He would like to begin her on flecainide 50mg  bid and Mestinon 15mg  qid. Pt has not begun either of these medications as of yet. She has requested Dr contact Dr to discuss their visit.   I advised pt I would message Dr [name] regarding her request and reach out to her next week.

## 2017-10-22 ENCOUNTER — Telehealth: Payer: Self-pay | Admitting: Internal Medicine

## 2017-10-22 NOTE — Telephone Encounter (Signed)
New Message:   Pt is wanting to know the status of how she need to take the medication: Flecainide

## 2017-10-23 NOTE — Telephone Encounter (Signed)
Pt scheduled to come in on 8/22 and 8/25 for RN visit for EKG's per her [facility] MD's request.

## 2017-10-24 ENCOUNTER — Ambulatory Visit (INDEPENDENT_AMBULATORY_CARE_PROVIDER_SITE_OTHER): Payer: Medicare PPO

## 2017-10-24 VITALS — BP 102/62 | HR 76 | Resp 14 | Ht 67.0 in | Wt 157.6 lb

## 2017-10-24 DIAGNOSIS — R000 Tachycardia, unspecified: Secondary | ICD-10-CM | POA: Diagnosis not present

## 2017-10-24 DIAGNOSIS — I471 Supraventricular tachycardia: Secondary | ICD-10-CM

## 2017-10-24 DIAGNOSIS — G90A Postural orthostatic tachycardia syndrome (POTS): Secondary | ICD-10-CM

## 2017-10-24 DIAGNOSIS — G901 Familial dysautonomia [Riley-Day]: Secondary | ICD-10-CM

## 2017-10-24 DIAGNOSIS — I951 Orthostatic hypotension: Secondary | ICD-10-CM | POA: Diagnosis not present

## 2017-10-24 NOTE — Addendum Note (Signed)
Addended by: Oretha Milch on: 10/24/2017 05:31 PM   Modules accepted: Orders

## 2017-10-24 NOTE — Progress Notes (Signed)
Pt presents today for a routine EKG for Flecainide start. Pt has been seen by Dr Vilinda Blanks at [facility] recently. He has ordered the start of Flecainide 50mg  bid.   Today pt has no complaints of dizziness or feelings of syncope. Vital signs are stable.   EKG will be reviewed by Dr . Pt will return to office on Tues 8/27 for a repeat EKG to compare.

## 2017-10-29 ENCOUNTER — Ambulatory Visit (INDEPENDENT_AMBULATORY_CARE_PROVIDER_SITE_OTHER): Payer: Medicare PPO | Admitting: *Deleted

## 2017-10-29 VITALS — BP 90/68 | HR 80

## 2017-10-29 DIAGNOSIS — I951 Orthostatic hypotension: Secondary | ICD-10-CM | POA: Diagnosis not present

## 2017-10-29 DIAGNOSIS — R000 Tachycardia, unspecified: Secondary | ICD-10-CM

## 2017-10-29 DIAGNOSIS — G90A Postural orthostatic tachycardia syndrome (POTS): Secondary | ICD-10-CM

## 2017-10-29 NOTE — Progress Notes (Signed)
1.) Reason for visit: EKG post flecainide start  2.) Name of MD requesting visit: Dr. [name], for Dr. Vilinda Blanks  3.) H&P: last EKG on 10/24/17 prior to starting flecainide, now taking 50 mg twice a day.   4.) ROS related to problem: No complaints of dizziness or syncope today.  Is having frequent nausea and has requested SL ondansetron to be taken as needed.  Thinks nausea is related to taking mestinon.  Pt reports this morning before taking mestinon BP was 67/48  5.) Assessment and plan per MD: EKG will be reviewed by Dr. [name].  Pt needs informed of when next EKG may be needed.  She is using a wrist cuff to take orthostatic blood pressures to send to Dr. Vilinda Blanks and wants to know if D.r [name] thinks this is ok.  I told her it would be ok to use this type of cuff if she uses it exclusively to be consistent.

## 2017-10-30 DIAGNOSIS — G901 Familial dysautonomia [Riley-Day]: Secondary | ICD-10-CM

## 2017-10-30 DIAGNOSIS — Q796 Ehlers-Danlos syndrome, unspecified: Secondary | ICD-10-CM

## 2017-10-31 MED ORDER — ONDANSETRON 8 MG PO TBDP: 8.0000 mg | ORAL_TABLET | Freq: Every day | ORAL | 0 refills | Status: DC | PRN

## 2017-11-01 ENCOUNTER — Telehealth: Payer: Self-pay | Admitting: Internal Medicine

## 2017-11-01 ENCOUNTER — Encounter: Payer: Self-pay | Admitting: Internal Medicine

## 2017-11-01 NOTE — Telephone Encounter (Signed)
New Message:   Patient calling about a early appointment Patient stated that Jennye Moccasin would have some one to call her about a appointment.

## 2017-11-18 ENCOUNTER — Ambulatory Visit: Payer: Self-pay | Admitting: Internal Medicine

## 2017-11-20 ENCOUNTER — Encounter: Payer: Self-pay | Admitting: Internal Medicine

## 2017-11-20 ENCOUNTER — Encounter

## 2017-11-20 ENCOUNTER — Ambulatory Visit (INDEPENDENT_AMBULATORY_CARE_PROVIDER_SITE_OTHER): Payer: Medicare PPO | Admitting: Internal Medicine

## 2017-11-20 VITALS — BP 90/64 | HR 83 | Ht 67.0 in | Wt 156.2 lb

## 2017-11-20 DIAGNOSIS — G901 Familial dysautonomia [Riley-Day]: Secondary | ICD-10-CM | POA: Diagnosis not present

## 2017-11-20 DIAGNOSIS — Q796 Ehlers-Danlos syndrome, unspecified: Secondary | ICD-10-CM

## 2017-11-20 DIAGNOSIS — I471 Supraventricular tachycardia: Secondary | ICD-10-CM | POA: Diagnosis not present

## 2017-11-20 DIAGNOSIS — R000 Tachycardia, unspecified: Secondary | ICD-10-CM | POA: Diagnosis not present

## 2017-11-20 DIAGNOSIS — I951 Orthostatic hypotension: Secondary | ICD-10-CM

## 2017-11-20 DIAGNOSIS — G90A Postural orthostatic tachycardia syndrome (POTS): Secondary | ICD-10-CM

## 2017-11-20 NOTE — Patient Instructions (Addendum)
Medication Instructions:  Your physician recommends that you continue on your current medications as directed. Please refer to the Current Medication list given to you today.  Labwork: None ordered.  Testing/Procedures:  Zio Patch 14 days  Follow-Up: Your physician wants you to follow-up in: 6 months with Dr [name]. You will receive a reminder letter in the mail two months in advance. If you don't receive a letter, please call our office to schedule the follow-up appointment.   Any Other Special Instructions Will Be Listed Below (If Applicable).     If you need a refill on your cardiac medications before your next appointment, please call your pharmacy.

## 2017-11-20 NOTE — Progress Notes (Signed)
Patient Care Team: [name], MD as PCP - General (Family Medicine) Levert Feinstein, MD as Consulting Physician (Oncology) Duke Salvia, MD as Consulting Physician (Cardiology)    HPI  Jennifer [name] is a 52 y.o. female Seen in followup for dysautonomia manifested by hypotension and tachy palpitations; she has postural tachycardia and document SVT  She suffers both for hemachromatosis as well as  rheumatoid arthritis for which she takes embrel The former is followed by Ottis Stain -MD and last report was good.   She also has primary and secondary anxiety and depression.There has been significant psychosocial stress   She has previously been treated with Florinef and ProAmatine the former she did not tolerate because of migraines the latter was discontinued following tilt table testing at [facility] where she had predominant tachycardia; at that juncture her blood pressures were better and low-dose beta blockers were attempted. She failed to tolerate these.  8/17 Echo EF normal  8/17 Holter  personally  reveiwed  Mean HR 79 min 56/max 145  1% PACs  She has been having significant tachycardia HR >140 @ home with BP in the 50-70sys at home  We started northera but uptitration past 100 mg tid was limited by N.V and HA    She was seen by the autonomic center at [facility]; they were concerned about SVT and prescribed flecainide.  Intercurrently, she has been prescribed Sudafed and has terminated her Mestinon.  The former was to augment blood pressure.  She continues to have nocturnal tachycardia.  This awakens her and is disruptive of sleep.  DATE PR interval QRSduration Dose  9/18  126 71 0  8/19 132 68 0  9/19  152  78 50     She continues to struggle with anxiety and panic attacks   Past Medical History:  Diagnosis Date  . Anxiety   . Arthritis    inactive RA- no meds  . ASD (atrial septal defect)    Closure   . Depression   . Dysrhythmia    PVCs  .  Headache(784.0)    migraines  . Hemochromatosis   . IBS (irritable bowel syndrome)   . Orthostatic syncope   . POTS (postural orthostatic tachycardia syndrome)   . Rheumatoid arthritis(714.0) 02/04/2012    Past Surgical History:  Procedure Laterality Date  . CHOLECYSTECTOMY  1989  . DILATION AND CURETTAGE OF UTERUS  2004  . LAPAROSCOPIC TUBAL LIGATION  10/26/2010   Procedure: LAPAROSCOPIC TUBAL LIGATION;  Surgeon: Zelphia Cairo;  Location: [facility];  Service: Gynecology;  Laterality: Bilateral;  . LAPAROSCOPY    . PATENT FORAMEN OVALE CLOSURE  2008    Current Outpatient Medications  Medication Sig Dispense Refill  . aspirin-acetaminophen-caffeine (EXCEDRIN MIGRAINE) 250-250-65 MG tablet Take 1 tablet by mouth daily.    . clonazePAM (KLONOPIN) 1 MG tablet Take 1 mg by mouth 2 (two) times daily. Up to 4 times daily prn for anxiety.    . cyclobenzaprine (FLEXERIL) 10 MG tablet Take 10 mg by mouth at bedtime.     Marland Kitchen desvenlafaxine (PRISTIQ) 50 MG 24 hr tablet Take 1 tablet by mouth daily.    Elgie Collard MINI 50 MG/ML SOCT Inject 15 mLs into the muscle once a week.    . escitalopram (LEXAPRO) 10 MG tablet Take 10 mg by mouth daily.    . flecainide (TAMBOCOR) 50 MG tablet Take 50 mg by mouth 2 (two) times daily.    . ondansetron (ZOFRAN-ODT) 8  MG disintegrating tablet Take 1 tablet (8 mg total) by mouth daily as needed for nausea or vomiting. Inside cheek 30 tablet 0  . propranolol (INDERAL) 20 MG tablet TAKE 1 TABLET BY MOUTH TWICE DAILY AS NEEDED FOR INCREASE HEART RATE 180 tablet 2  . pseudoephedrine (SUDAFED) 120 MG 12 hr tablet Take 1 tablet by mouth 2 (two) times daily.    Marland Kitchen topiramate (TOPAMAX) 100 MG tablet 300 mg as directed.   4   No current facility-administered medications for this visit.     Allergies  Allergen Reactions  . Florinef [Fludrocortisone] Other (See Comments)    Severe migraine  . Leflunomide Diarrhea  . Methotrexate Derivatives Hives  . Reglan [Metoclopramide]      Jerky   . Remicade [Infliximab] Hives, Diarrhea and Other (See Comments)    Increase heart rate,decrease blood pressure  . Morphine And Related Rash    Rxn to IV morphine; has never had PO morphine  . Sulfonamide Derivatives Rash    Review of Systems negative except from HPI and PMH  Physical Exam BP 90/64   Pulse 83   Ht 5\' 7"  (1.702 m)   Wt 156 lb 3.2 oz (70.9 kg)   SpO2 99%   BMI 24.46 kg/m  Well developed and nourished in no acute distress HENT normal Neck supple with JVP-flat Clear Regular rate and rhythm, no murmurs or gallops Abd-soft with active BS No Clubbing cyanosis edema Skin-warm and dry A & Oriented  Grossly normal sensory and motor function\ rossly normal sensory and motor function     Assessment and  Plan  Dysautonomia with hypotension and tachycardia  Rheumatoid arthritis  Depression-primary and secondary  Arachnodactyly  SVT  Ehlers-Danlos 3 Joint hypermobility  Nocturnal tachycardia   The patient is currently on Sudafed.  Blood pressures are over 90.  Wow  Doctors at apparently had talked to her about catheter ablation.  She has had one documented episode of SVT; her nocturnal tachycardia has not been clarified mechanistically.  It is my impression that much of nocturnal tachycardia is secondary in the context of her anxieties, I wonder further in this regard.  Hence, I recommended that we use a ZIO Patch which can give [employer] onset and offset data related to her frequent nocturnal episodes.  This may help direct the doctors at [facility] as to therapeutic intervention.  Encouraged her on ongoing soul care as suspect some of this may be POTS    Some better on the flecainide:  conducticion intervals within range   Have also suggested she try taking her Inderal nightly to see if she can mitigate nocturnal palpitations   LS-RN raises the possibility as to whether the Sudafed can be aggravating her nocturnal tachycardia.  Great  thought

## 2017-11-26 ENCOUNTER — Telehealth: Payer: Self-pay | Admitting: Internal Medicine

## 2017-11-26 NOTE — Telephone Encounter (Signed)
New Message:    Pt wanted Dr [name] to know that she cancelled her appt for Friday. The reason she is cancelling is because she is waiting to hear fr Dr Vilinda Blanks from [place].

## 2017-11-29 ENCOUNTER — Ambulatory Visit: Payer: Self-pay | Admitting: Internal Medicine

## 2017-12-02 ENCOUNTER — Ambulatory Visit: Payer: Medicare PPO | Admitting: Physical Therapy

## 2017-12-03 ENCOUNTER — Ambulatory Visit: Payer: Medicare PPO | Admitting: Physical Therapy

## 2017-12-05 ENCOUNTER — Other Ambulatory Visit: Payer: Self-pay | Admitting: Oncology

## 2017-12-05 DIAGNOSIS — Z862 Personal history of diseases of the blood and blood-forming organs and certain disorders involving the immune mechanism: Secondary | ICD-10-CM

## 2017-12-25 ENCOUNTER — Ambulatory Visit: Payer: Self-pay | Admitting: Internal Medicine

## 2017-12-25 ENCOUNTER — Ambulatory Visit: Payer: Medicare PPO | Admitting: Physical Therapy

## 2017-12-27 ENCOUNTER — Encounter

## 2018-01-01 ENCOUNTER — Ambulatory Visit: Payer: Medicare PPO | Admitting: Physical Therapy

## 2018-01-08 ENCOUNTER — Ambulatory Visit: Payer: Medicare PPO | Admitting: Physical Therapy

## 2018-01-13 ENCOUNTER — Other Ambulatory Visit: Payer: Self-pay | Admitting: Internal Medicine

## 2018-01-20 ENCOUNTER — Other Ambulatory Visit: Payer: Self-pay | Admitting: Oncology

## 2018-01-20 DIAGNOSIS — Z862 Personal history of diseases of the blood and blood-forming organs and certain disorders involving the immune mechanism: Secondary | ICD-10-CM

## 2018-01-23 ENCOUNTER — Other Ambulatory Visit: Payer: Self-pay

## 2018-01-28 ENCOUNTER — Other Ambulatory Visit: Payer: Self-pay

## 2018-02-03 ENCOUNTER — Encounter: Payer: Self-pay | Admitting: Oncology

## 2018-03-10 ENCOUNTER — Other Ambulatory Visit (INDEPENDENT_AMBULATORY_CARE_PROVIDER_SITE_OTHER): Payer: Medicare PPO

## 2018-03-10 DIAGNOSIS — Z862 Personal history of diseases of the blood and blood-forming organs and certain disorders involving the immune mechanism: Secondary | ICD-10-CM

## 2018-03-11 LAB — COMPREHENSIVE METABOLIC PANEL
ALT: 24 IU/L (ref 0–32)
AST: 23 IU/L (ref 0–40)
Albumin/Globulin Ratio: 1.6 (ref 1.2–2.2)
Albumin: 3.9 g/dL (ref 3.5–5.5)
Alkaline Phosphatase: 71 IU/L (ref 39–117)
BUN/Creatinine Ratio: 12 (ref 9–23)
BUN: 10 mg/dL (ref 6–24)
Bilirubin Total: <0.2 mg/dL (ref 0.0–1.2)
CHLORIDE: 108 mmol/L — AB (ref 96–106)
CO2: 18 mmol/L — ABNORMAL LOW (ref 20–29)
Calcium: 9 mg/dL (ref 8.7–10.2)
Creatinine, Ser: 0.83 mg/dL (ref 0.57–1.00)
GFR calc non Af Amer: 82 mL/min/{1.73_m2} (ref >59)
GFR, EST AFRICAN AMERICAN: 95 mL/min/{1.73_m2} (ref >59)
Globulin, Total: 2.4 g/dL (ref 1.5–4.5)
Glucose: 79 mg/dL (ref 65–99)
Potassium: 4 mmol/L (ref 3.5–5.2)
Sodium: 140 mmol/L (ref 134–144)
Total Protein: 6.3 g/dL (ref 6.0–8.5)

## 2018-03-11 LAB — CBC WITH DIFFERENTIAL/PLATELET
Basophils Absolute: 0.1 10*3/uL (ref 0.0–0.2)
Basos: 1 %
EOS (ABSOLUTE): 0.2 10*3/uL (ref 0.0–0.4)
Eos: 3 %
Hematocrit: 36.9 % (ref 34.0–46.6)
Hemoglobin: 12.5 g/dL (ref 11.1–15.9)
Immature Grans (Abs): 0 10*3/uL (ref 0.0–0.1)
Immature Granulocytes: 0 %
Lymphocytes Absolute: 2 10*3/uL (ref 0.7–3.1)
Lymphs: 38 %
MCH: 34.3 pg — ABNORMAL HIGH (ref 26.6–33.0)
MCHC: 33.9 g/dL (ref 31.5–35.7)
MCV: 101 fL — ABNORMAL HIGH (ref 79–97)
Monocytes Absolute: 0.4 10*3/uL (ref 0.1–0.9)
Monocytes: 8 %
NEUTROS ABS: 2.6 10*3/uL (ref 1.4–7.0)
Neutrophils: 50 %
Platelets: 201 10*3/uL (ref 150–450)
RBC: 3.64 x10E6/uL — ABNORMAL LOW (ref 3.77–5.28)
RDW: 12.7 % (ref 11.7–15.4)
WBC: 5.2 10*3/uL (ref 3.4–10.8)

## 2018-03-11 LAB — FERRITIN: Ferritin: 179 ng/mL — ABNORMAL HIGH (ref 15–150)

## 2018-03-17 ENCOUNTER — Other Ambulatory Visit: Payer: Self-pay | Admitting: Internal Medicine

## 2018-03-17 NOTE — Telephone Encounter (Signed)
Pt is requesting a refill on this medication. Would you like to refill this medicine? Please address. Thank you.

## 2018-03-31 ENCOUNTER — Encounter: Payer: Self-pay | Admitting: *Deleted

## 2018-04-01 ENCOUNTER — Encounter: Payer: Self-pay | Admitting: Oncology

## 2018-04-01 ENCOUNTER — Ambulatory Visit: Payer: Medicare PPO | Admitting: Oncology

## 2018-04-01 ENCOUNTER — Other Ambulatory Visit: Payer: Self-pay | Admitting: *Deleted

## 2018-04-01 ENCOUNTER — Other Ambulatory Visit: Payer: Self-pay

## 2018-04-01 VITALS — BP 96/59 | HR 90 | Temp 97.9°F | Ht 67.0 in | Wt 154.9 lb

## 2018-04-01 DIAGNOSIS — M059 Rheumatoid arthritis with rheumatoid factor, unspecified: Secondary | ICD-10-CM | POA: Diagnosis not present

## 2018-04-01 DIAGNOSIS — Z8679 Personal history of other diseases of the circulatory system: Secondary | ICD-10-CM

## 2018-04-01 DIAGNOSIS — Z79899 Other long term (current) drug therapy: Secondary | ICD-10-CM

## 2018-04-01 DIAGNOSIS — Z862 Personal history of diseases of the blood and blood-forming organs and certain disorders involving the immune mechanism: Secondary | ICD-10-CM

## 2018-04-01 DIAGNOSIS — Z9181 History of falling: Secondary | ICD-10-CM

## 2018-04-01 DIAGNOSIS — R296 Repeated falls: Secondary | ICD-10-CM

## 2018-04-01 DIAGNOSIS — Z888 Allergy status to other drugs, medicaments and biological substances status: Secondary | ICD-10-CM

## 2018-04-01 DIAGNOSIS — Z885 Allergy status to narcotic agent status: Secondary | ICD-10-CM

## 2018-04-01 DIAGNOSIS — I951 Orthostatic hypotension: Secondary | ICD-10-CM

## 2018-04-01 DIAGNOSIS — Z882 Allergy status to sulfonamides status: Secondary | ICD-10-CM

## 2018-04-01 DIAGNOSIS — Z8701 Personal history of pneumonia (recurrent): Secondary | ICD-10-CM

## 2018-04-01 DIAGNOSIS — Z8619 Personal history of other infectious and parasitic diseases: Secondary | ICD-10-CM

## 2018-04-01 NOTE — Patient Instructions (Signed)
To lab today We will call with results & decide whether or not to schedule a phlebotomy  A referral was made to a Hematologist at [facility] for your ongoing Hematology care.  It has been a pleasure working with you!

## 2018-04-01 NOTE — Progress Notes (Signed)
Hematology and Oncology Follow Up Visit  Jennifer [name] [id number] [birth year] 52 y.o. 04/01/2018 10:31 AM   Principle Diagnosis: Encounter Diagnosis  Name Primary?  . 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 Multiple family members are affected. Mrs. Jennifer [name] has a low normal ferritin level and has not required phlebotomy. She has, I believe seronegative, rheumatoid arthritis and is currently receiving Enbrel injections. She has developed a postural hypotension syndrome and has seen a number of specialists. She has been tried on Florinef, and midodrine with either intolerance or no improvement. She has had syncopal episodes and fallen down a number of occasions. Locally she has been followed by Dr. Berton Mount   Interim History: I have not seen Jennifer [name] for a formal visit in a number of years although she does come in periodically to check her ferritins.  A lot has transpired since her last visit with me in January, 2015.  Her idiopathic rheumatologic disorder has come under control on weekly Enbrel injections.  She has gained back all of her lost weight and looks remarkably healthy appearing. She has had problems with atrial arrhythmias.  She was diagnosed with potts syndrome, postural orthostatic tachycardia syndrome.  She recently had a radiofrequency ablation procedure in October 2019 at [facility] to control supraventricular tachyarrhythmias.  She was able to come off flecainide.  She is only taking propranolol on a as needed basis. Some of her back pain was related to adult onset scoliosis and she is getting relief from a physical therapy program recommended by her rheumatologist. Most recent ferritin value is 179 recorded March 10, 2018.  Of note a week before she had this labs she developed a herpes zoster infection of a lumbar dermatome and shortly after that, she developed influenza.   This may have impacted on the ferritin result since her ferritin levels have been consistently below 100 on multiple occasions and she has never required phlebotomy.  Most recent prior value in our lab was in April 2018 at 77.  She has had values recorded as low as 38.  Liver functions remain normal. She does admit to occasional paresthesias in her fingers. Her family is doing very well.  She has 3 boys.  One is now postgraduate studying in [place], another a sophomore in college into informatics and artificial intelligence, and her 51 year old is extremely bright and cannot get enough books to read.  Medications: reviewed  Allergies:  Allergies  Allergen Reactions  . Florinef [Fludrocortisone] Other (See Comments)    Severe migraine  . Leflunomide Diarrhea  . Methotrexate Derivatives Hives  . Reglan [Metoclopramide]     Jerky   . Remicade [Infliximab] Hives, Diarrhea and Other (See Comments)    Increase heart rate,decrease blood pressure  . Morphine And Related Rash    Rxn to IV morphine; has never had PO morphine  . Sulfonamide Derivatives Rash    Review of Systems: See interim history Remaining ROS negative:   Physical Exam: Blood pressure (!) 96/59, pulse 90, temperature 97.9 F (36.6 C), temperature source Oral, height 5\' 7"  (1.702 m), weight 154 lb 14.4 oz (70.3 kg), SpO2 100 %. Wt Readings from Last 3 Encounters:  04/01/18 154 lb 14.4 oz (70.3 kg)  11/20/17 156 lb 3.2 oz (70.9 kg)  10/24/17 157 lb 9.6 oz (71.5 kg)     General appearance: Well-nourished Caucasian woman HENNT: Pharynx no erythema, exudate, mass, or ulcer. No thyromegaly  or thyroid nodules Lymph nodes: No cervical, supraclavicular, or axillary lymphadenopathy Breasts:  Lungs: Clear to auscultation, resonant to percussion throughout Heart: Slightly irregular  rhythm, no murmur, no gallop, no rub, no click, no edema Abdomen: Soft, nontender, normal bowel sounds, no mass, no organomegaly Extremities:  No edema, no calf tenderness Musculoskeletal: no joint deformities GU:  Vascular: Carotid pulses 2+, no bruits, Neurologic: Alert, oriented, PERRLA, , cranial nerves grossly normal, motor strength 5 over 5, reflexes 1+ symmetric, upper body coordination normal, gait normal, vibration sensation normal by tuning fork exam over the fingers. Skin: No rash or ecchymosis  Lab Results: CBC W/Diff    Component Value Date/Time   WBC 5.2 03/10/2018 0837   WBC 4.6 11/25/2015 0437   RBC 3.64 (L) 03/10/2018 0837   RBC 3.57 (L) 11/25/2015 0437   HGB 12.5 03/10/2018 0837   HGB 12.0 03/09/2013 1128   HCT 36.9 03/10/2018 0837   HCT 36.3 03/09/2013 1128   PLT 201 03/10/2018 0837   MCV 101 (H) 03/10/2018 0837   MCV 99.5 03/09/2013 1128   MCH 34.3 (H) 03/10/2018 0837   MCH 33.1 11/25/2015 0437   MCHC 33.9 03/10/2018 0837   MCHC 33.2 11/25/2015 0437   RDW 12.7 03/10/2018 0837   RDW 12.2 03/09/2013 1128   LYMPHSABS 2.0 03/10/2018 0837   LYMPHSABS 2.3 03/09/2013 1128   MONOABS 0.3 05/03/2014 1356   MONOABS 0.4 03/09/2013 1128   EOSABS 0.2 03/10/2018 0837   BASOSABS 0.1 03/10/2018 0837   BASOSABS 0.0 03/09/2013 1128     Chemistry      Component Value Date/Time   NA 140 03/10/2018 0837   NA 142 01/21/2013 1315   K 4.0 03/10/2018 0837   K 3.8 01/21/2013 1315   CL 108 (H) 03/10/2018 0837   CL 111 (H) 01/30/2012 1356   CO2 18 (L) 03/10/2018 0837   CO2 22 01/21/2013 1315   BUN 10 03/10/2018 0837   BUN 6.9 (L) 01/21/2013 1315   CREATININE 0.83 03/10/2018 0837   CREATININE 0.83 05/03/2014 1356   CREATININE 0.8 01/21/2013 1315      Component Value Date/Time   CALCIUM 9.0 03/10/2018 0837   CALCIUM 8.7 01/21/2013 1315   ALKPHOS 71 03/10/2018 0837   ALKPHOS 67 01/21/2013 1315   AST 23 03/10/2018 0837   AST 32 01/21/2013 1315   ALT 24 03/10/2018 0837   ALT 28 01/21/2013 1315   BILITOT <0.2 03/10/2018 0837   BILITOT 0.30 01/21/2013 1315       Radiological Studies: No results  found.  Impression:  1.  Homozygous for the C282Y hemochromatosis gene.  Low clinical and genetic penetrance with chronically low ferritins. I feel the recent rise in her ferritin was probably circumstantial related to a acute herpes zoster infection followed by influenza.  I am going to repeat the ferritin today.  If persistent elevation with no acute inflammation, then I will schedule her for a phlebotomy.  2.  Seronegative rheumatoid arthritis Excellent partial response to long-term Enbrel.  3.  Postural hypotension syndrome  4.  Recent atrial arrhythmias now status post radiofrequency ablation procedure for SVT October 2019.  I will refer her to the [facility] for her ongoing hematology care at this time.  CC: Patient Care Team: [name], MD as PCP - General (Family Medicine) Levert Feinstein, MD as Consulting Physician (Oncology) Duke Salvia, MD as Consulting Physician (Cardiology)   Cephas Darby, MD, FACP  Hematology-Oncology/Internal Medicine  1/28/202010:31 AM

## 2018-04-02 ENCOUNTER — Telehealth: Payer: Self-pay | Admitting: *Deleted

## 2018-04-02 ENCOUNTER — Other Ambulatory Visit: Payer: Self-pay | Admitting: Oncology

## 2018-04-02 DIAGNOSIS — Z862 Personal history of diseases of the blood and blood-forming organs and certain disorders involving the immune mechanism: Secondary | ICD-10-CM

## 2018-04-02 LAB — FERRITIN: Ferritin: 149 ng/mL (ref 15–150)

## 2018-04-02 NOTE — Telephone Encounter (Signed)
Pt called / informed "ferritin 149, down from 179 so I believe there was a component of non specific increase from recent infection. I think we can hold off on phlebotomy for now. Would repeat ferritin in 2 months at [facility] office. I will put in order" per Dr Cyndie Chime. Lab appt scheduled March 24 @ 0915 AM here at the office.

## 2018-04-02 NOTE — Telephone Encounter (Signed)
-----   Message from Levert Feinstein, MD sent at 04/02/2018  8:53 AM EST ----- Call pt: ferritin 149, down from 179 so I believe there was a component of non specific increase from recent infection. I think we can hold off on phlebotomy for now. Would repeat ferritin in 2 months at [facility] office. I will put in order.

## 2018-04-29 ENCOUNTER — Encounter: Payer: Self-pay | Admitting: Hematology

## 2018-05-19 ENCOUNTER — Telehealth: Payer: Self-pay

## 2018-05-19 MED ORDER — ONDANSETRON 8 MG PO TBDP: ORAL_TABLET | ORAL | 3 refills | Status: DC

## 2018-05-19 NOTE — Telephone Encounter (Signed)
Spoke with pt who agrees to have appointment rescheduled. She understands we will contact her in the next few weeks to reschedule. I have also sent in a refill for zofran per her request.

## 2018-05-20 ENCOUNTER — Ambulatory Visit: Payer: Self-pay | Admitting: Internal Medicine

## 2018-05-22 ENCOUNTER — Other Ambulatory Visit: Payer: Self-pay

## 2018-05-22 DIAGNOSIS — Z862 Personal history of diseases of the blood and blood-forming organs and certain disorders involving the immune mechanism: Secondary | ICD-10-CM

## 2018-05-23 ENCOUNTER — Telehealth: Payer: Self-pay | Admitting: Hematology

## 2018-05-23 NOTE — Telephone Encounter (Signed)
I've cld the pt once this morning and once this afternoon to reschedule her appt with Dr. Mosetta Putt on 3/23. Lft a vm with my direct phone number to cb.

## 2018-05-23 NOTE — Telephone Encounter (Signed)
Pt's mom cld to have me reschedule her appt bc the pt is currently out of town. I cld the pt and lft a vm to reschedule.

## 2018-05-26 ENCOUNTER — Encounter: Payer: Self-pay | Admitting: Hematology

## 2018-05-26 ENCOUNTER — Inpatient Hospital Stay: Payer: Medicare PPO

## 2018-05-27 ENCOUNTER — Other Ambulatory Visit: Payer: Self-pay

## 2018-05-30 ENCOUNTER — Telehealth: Payer: Self-pay | Admitting: Hematology

## 2018-05-30 NOTE — Telephone Encounter (Signed)
Pt cld to reschedule her hem appt with Dr. Mosetta Putt. Pt's new appt is on 4/6 at 2:15pm with labs at 1:45pm.

## 2018-05-30 NOTE — Telephone Encounter (Signed)
Lft vm to reschedule hem appt w/Dr. Mosetta Putt

## 2018-06-05 ENCOUNTER — Telehealth: Payer: Medicare PPO | Admitting: Internal Medicine

## 2018-06-05 ENCOUNTER — Other Ambulatory Visit: Payer: Self-pay

## 2018-06-06 ENCOUNTER — Telehealth: Payer: Self-pay

## 2018-06-06 NOTE — Telephone Encounter (Signed)
Patient calls stating she has a cold, coughing, sneezing, has appointment on Monday will reschedule, scheduling message has been sent.

## 2018-06-07 ENCOUNTER — Other Ambulatory Visit: Payer: Self-pay | Admitting: Hematology

## 2018-06-07 DIAGNOSIS — Z862 Personal history of diseases of the blood and blood-forming organs and certain disorders involving the immune mechanism: Secondary | ICD-10-CM

## 2018-06-09 ENCOUNTER — Other Ambulatory Visit: Payer: Medicare PPO

## 2018-06-09 ENCOUNTER — Encounter: Payer: Self-pay | Admitting: Hematology

## 2018-06-11 ENCOUNTER — Encounter: Payer: Self-pay | Admitting: Hematology

## 2018-06-11 ENCOUNTER — Telehealth: Payer: Self-pay | Admitting: Hematology

## 2018-06-11 NOTE — Telephone Encounter (Signed)
lft vm to reschedule 4/6 appt with Dr. Mosetta Putt.

## 2018-06-11 NOTE — Telephone Encounter (Signed)
Pt has been rescheduled to see Dr. Mosetta Putt on 6/4 at 11:15am with labs at 10:45am. She's been cld and agreed to the appt date and time.

## 2018-08-01 ENCOUNTER — Encounter: Payer: Self-pay | Admitting: Hematology

## 2018-08-04 NOTE — Telephone Encounter (Signed)
Spoke with Jennifer [name] who states she has been having many POTS flare ups recently. She states this morning she woke up with her HR in the 160s and her SBP in the 60's. She took a propanolol and sudafed; feeling better now. She has not spoken with her doctor at [facility] as she wanted to seek advice from Dr [name] first. She is also interested in a brain MRI to assess recurrent concussions. Her last fall was on Mother's Day weekend which ended in a severe migraine. She states she has had several issues like this in the past year.   I have added her to Dr Odessa Fleming schedule in [place], as he has no availability at [facility] until mid June. She has agreed to the virtual visit. Please see consent below:  YOUR CARDIOLOGY TEAM HAS ARRANGED FOR AN E-VISIT FOR YOUR APPOINTMENT - PLEASE REVIEW IMPORTANT INFORMATION BELOW SEVERAL DAYS PRIOR TO YOUR APPOINTMENT  Due to the recent COVID-19 pandemic, we are transitioning in-person office visits to tele-medicine visits in an effort to decrease unnecessary exposure to our patients, their families, and staff. These visits are billed to your insurance just like a normal visit is. We also encourage you to sign up for MyChart if you have not already done so. You will need a smartphone if possible. For patients that do not have this, we can still complete the visit using a regular telephone but do prefer a smartphone to enable video when possible. You may have a family member that lives with you that can help. If possible, we also ask that you have a blood pressure cuff and scale at home to measure your blood pressure, heart rate and weight prior to your scheduled appointment. Patients with clinical needs that need an in-person evaluation and testing will still be able to come to the office if absolutely necessary. If you have any questions, feel free to call our office.     THE DAY OF YOUR APPOINTMENT  Approximately 15 minutes prior to your scheduled appointment, you will  receive a telephone call from one of [facility] team - your caller ID may say "Unknown caller."  Our staff will confirm medications, vital signs for the day and any symptoms you may be experiencing. Please have this information available prior to the time of visit start. It may also be helpful for you to have a pad of paper and pen handy for any instructions given during your visit. They will also walk you through joining the smartphone meeting if this is a video visit.    CONSENT FOR TELE-HEALTH VISIT - PLEASE REVIEW  I hereby voluntarily request, consent and authorize [facility] and its employed or contracted physicians, physician assistants, nurse practitioners or other licensed health care professionals (the Practitioner), to provide me with telemedicine health care services (the "Services") as deemed necessary by the treating Practitioner. I acknowledge and consent to receive the Services by the Practitioner via telemedicine. I understand that the telemedicine visit will involve communicating with the Practitioner through live audiovisual communication technology and the disclosure of certain medical information by electronic transmission. I acknowledge that I have been given the opportunity to request an in-person assessment or other available alternative prior to the telemedicine visit and am voluntarily participating in the telemedicine visit.  I understand that I have the right to withhold or withdraw my consent to the use of telemedicine in the course of my care at any time, without affecting my right to future care or treatment, and that  the Practitioner or I may terminate the telemedicine visit at any time. I understand that I have the right to inspect all information obtained and/or recorded in the course of the telemedicine visit and may receive copies of available information for a reasonable fee.  I understand that some of the potential risks of receiving the Services via telemedicine  include:  Marland Kitchen Delay or interruption in medical evaluation due to technological equipment failure or disruption; . Information transmitted may not be sufficient (e.g. poor resolution of images) to allow for appropriate medical decision making by the Practitioner; and/or  . In rare instances, security protocols could fail, causing a breach of personal health information.  Furthermore, I acknowledge that it is my responsibility to provide information about my medical history, conditions and care that is complete and accurate to the best of my ability. I acknowledge that Practitioner's advice, recommendations, and/or decision may be based on factors not within their control, such as incomplete or inaccurate data provided by me or distortions of diagnostic images or specimens that may result from electronic transmissions. I understand that the practice of medicine is not an exact science and that Practitioner makes no warranties or guarantees regarding treatment outcomes. I acknowledge that I will receive a copy of this consent concurrently upon execution via email to the email address I last provided but may also request a printed copy by calling the office of [facility].    I understand that my insurance will be billed for this visit.   I have read or had this consent read to me. . I understand the contents of this consent, which adequately explains the benefits and risks of the Services being provided via telemedicine.  . I have been provided ample opportunity to ask questions regarding this consent and the Services and have had my questions answered to my satisfaction. . I give my informed consent for the services to be provided through the use of telemedicine in my medical care  By participating in this telemedicine visit I agree to the above.

## 2018-08-05 ENCOUNTER — Other Ambulatory Visit: Payer: Self-pay

## 2018-08-05 ENCOUNTER — Telehealth (INDEPENDENT_AMBULATORY_CARE_PROVIDER_SITE_OTHER): Payer: Medicare PPO | Admitting: Internal Medicine

## 2018-08-05 VITALS — BP 91/73 | HR 100 | Ht 67.0 in | Wt 150.0 lb

## 2018-08-05 DIAGNOSIS — I471 Supraventricular tachycardia: Secondary | ICD-10-CM

## 2018-08-05 DIAGNOSIS — G901 Familial dysautonomia [Riley-Day]: Secondary | ICD-10-CM | POA: Diagnosis not present

## 2018-08-05 DIAGNOSIS — I959 Hypotension, unspecified: Secondary | ICD-10-CM | POA: Diagnosis not present

## 2018-08-05 NOTE — Patient Instructions (Addendum)
Medication Instructions:  - Your physician recommends that you continue on your current medications as directed. Please refer to the Current Medication list given to you today.  If you need a refill on your cardiac medications before your next appointment, please call your pharmacy.   Lab work: - none ordered  If you have labs (blood work) drawn today and your tests are completely normal, you will receive your results only by: Marland Kitchen MyChart Message (if you have MyChart) OR . A paper copy in the mail If you have any lab test that is abnormal or we need to change your treatment, we will call you to review the results.  Testing/Procedures: - none ordered  Follow-Up: At [facility], you and your health needs are our priority.  As part of our continuing mission to provide you with exceptional heart care, we have created designated Provider Care Teams.  These Care Teams include your primary Cardiologist (physician) and Advanced Practice Providers (APPs -  Physician Assistants and Nurse Practitioners) who all work together to provide you with the care you need, when you need it. . in 2 weeks with Dr. [name] (e-visit) - Friday 08/22/18 at 9:45 am  Any Other Special Instructions Will Be Listed Below (If Applicable). - N/A

## 2018-08-05 NOTE — Progress Notes (Signed)
Electrophysiology TeleHealth Note   Due to national recommendations of social distancing due to COVID 19, an audio/video telehealth visit is felt to be most appropriate for this patient at this time.  See MyChart message from today for the patient's consent to telehealth for [facility].   Date:  08/05/2018   ID:  Jennifer [name], DOB 1965-05-12, MRN [id number]  Location: patient's home  Provider location: [address] [place]  Evaluation Performed: Follow-up visit  PCP:  [name], MD  Cardiologist:    [facility] Electrophysiologist:  SK   Chief Complaint: Aggravation of pots symptoms  History of Present Illness:    Jennifer [name] is a 53 y.o. female who presents via audio/video conferencing for a telehealth visit today.  Since last being seen in our clinic for dysautonomia tachycardia the patient reports having done relatively well until about 2 months ago.  Started having worsening problems with anxiety   DATE PR interval QRSduration Dose  9/18  126 71 0  8/19 132 68 0  9/19  152  78 50   And struggling a great deal with anxiety.  Has been tried on various SSRIs of late.  Most recently on fluvoxamine.  Severe headaches, leaving her bedbound for 24 hours at a time for 5 times over the last 3 weeks.  She has awakened at night with palpitations and chest pains.  Going to the bathroom she will come back and has recorded her  HR   in the 150s BP in the 80s-90s.  She takes Sudafed and Inderal.  Goes back to bed.  Had a bad fall on Mother's Day.  Hit her head against a marble countertop.  Has had migraine headaches but not persistent headaches.  The patient denies symptoms of fevers, chills, cough, or new SOB worrisome for COVID 19.    Past Medical History:  Diagnosis Date  . Anxiety   . Arthritis    inactive RA- no meds  . ASD (atrial septal defect)    Closure   . Depression   . Dysrhythmia    PVCs  . Headache(784.0)    migraines  .  Hemochromatosis   . IBS (irritable bowel syndrome)   . Orthostatic syncope   . POTS (postural orthostatic tachycardia syndrome)   . Rheumatoid arthritis(714.0) 02/04/2012    Past Surgical History:  Procedure Laterality Date  . CHOLECYSTECTOMY  1989  . DILATION AND CURETTAGE OF UTERUS  2004  . LAPAROSCOPIC TUBAL LIGATION  10/26/2010   Procedure: LAPAROSCOPIC TUBAL LIGATION;  Surgeon: Zelphia Cairo;  Location: [facility];  Service: Gynecology;  Laterality: Bilateral;  . LAPAROSCOPY    . PATENT FORAMEN OVALE CLOSURE  2008    Current Outpatient Medications  Medication Sig Dispense Refill  . aspirin-acetaminophen-caffeine (EXCEDRIN MIGRAINE) 250-250-65 MG tablet Take 1 tablet by mouth daily.    . clonazePAM (KLONOPIN) 1 MG tablet Take 1 mg by mouth 2 (two) times daily. Up to 4 times daily prn for anxiety.    . cyclobenzaprine (FLEXERIL) 10 MG tablet Take 10 mg by mouth at bedtime.     Marland Kitchen desvenlafaxine (PRISTIQ) 50 MG 24 hr tablet Take 1 tablet by mouth daily.    Elgie Collard MINI 50 MG/ML SOCT Inject 15 mLs into the muscle once a week.    . fluvoxaMINE (LUVOX) 50 MG tablet     . ondansetron (ZOFRAN-ODT) 8 MG disintegrating tablet DISSOLVE 1 TABLET(8 MG) INSIDE CHEEK DAILY AS NEEDED FOR NAUSEA OR  VOMITING 30 tablet 3  . propranolol (INDERAL) 20 MG tablet TAKE 1 TABLET BY MOUTH TWICE DAILY AS NEEDED FOR INCREASE HEART RATE 180 tablet 2  . topiramate (TOPAMAX) 100 MG tablet 300 mg as directed.   4   No current facility-administered medications for this visit.     Allergies:   Florinef [fludrocortisone]; Leflunomide; Methotrexate derivatives; Reglan [metoclopramide]; Remicade [infliximab]; Morphine and related; and Sulfonamide derivatives   Social History:  The patient  reports that she has never smoked. She has never used smokeless tobacco. She reports current alcohol use. She reports that she does not use drugs.   Family History:  The patient's   family history includes Hemochromatosis in her  mother and sister.   ROS:  Please see the history of present illness.   All other systems are personally reviewed and negative.    Exam:    Vital Signs:  BP 91/73 (BP Location: Left Arm, Patient Position: Standing, Cuff Size: Normal) Comment: with propanolol and sudafed  Pulse 100   Ht 5\' 7"  (1.702 m)   Wt 150 lb (68 kg)   BMI 23.49 kg/m     Well appearing, alert and conversant, regular work of breathing,  good skin color Eyes- anicteric, neuro- grossly intact, skin- no apparent rash or lesions or cyanosis, mouth- oral mucosa is pink   Labs/Other Tests and Data Reviewed:    Recent Labs: 03/10/2018: ALT 24; BUN 10; Creatinine, Ser 0.83; Hemoglobin 12.5; Platelets 201; Potassium 4.0; Sodium 140   Wt Readings from Last 3 Encounters:  08/05/18 150 lb (68 kg)  04/01/18 154 lb 14.4 oz (70.3 kg)  11/20/17 156 lb 3.2 oz (70.9 kg)     Other studies personally reviewed: Additional studies/ records that were reviewed today include:     ASSESSMENT & PLAN:   Dysautonomia with hypotension and tachycardia  Rheumatoid arthritis  Depression-primary and secondary  Arachnodactyly  SVT  Ehlers-Danlos 3 Joint hypermobility  Nocturnal tachycardia  She is struggling with anxiety.  This is prompted efforts to modify her medications.  Most recently has been put on Luvox and has had major problems concurrent with this.  I have encouraged her to follow-up with her psychiatrist and discuss discontinuing and alternative therapy.  With her migraine headaches, have suggested that she try and find a place to rest where she can be not recumbent i.e. legs dangling and back up.  She will work on that.  We will plan to regroup in about 2 weeks by telehealth to review how she is doing off the Luvox.  COVID 19 screen The patient denies symptoms of COVID 19 at this time.  The importance of social distancing was discussed today.  Follow-up:  2 weeks  telehealth visit      Current medicines  are reviewed at length with the patient today.   The patient has concerns regarding her medicines.  The following changes were made today:  As above   Labs/ tests ordered today include:  No orders of the defined types were placed in this encounter.   Future tests ( post COVID )    in    months  Patient Risk:  after full review of this patients clinical status, I feel that they are at moderate  risk at this time.  Today, I have spent 11 minutes with the patient with telehealth technology discussing the above.  Signed, Sherryl Manges, MD  08/05/2018 11:01 AM     [facility] [address]  [place] [address] [phone] (office) [phone] (fax)

## 2018-08-07 ENCOUNTER — Encounter: Payer: Medicare PPO | Admitting: Hematology

## 2018-08-07 ENCOUNTER — Other Ambulatory Visit: Payer: Medicare PPO

## 2018-08-11 ENCOUNTER — Telehealth: Payer: Self-pay | Admitting: Hematology

## 2018-08-11 NOTE — Telephone Encounter (Signed)
Lft the pt a vm to reschedule

## 2018-08-22 ENCOUNTER — Telehealth: Payer: Medicare PPO | Admitting: Internal Medicine

## 2018-08-22 ENCOUNTER — Other Ambulatory Visit: Payer: Self-pay

## 2018-09-02 ENCOUNTER — Encounter (HOSPITAL_COMMUNITY): Payer: Self-pay | Admitting: Emergency Medicine

## 2018-09-02 ENCOUNTER — Other Ambulatory Visit: Payer: Self-pay

## 2018-09-02 ENCOUNTER — Emergency Department (HOSPITAL_COMMUNITY)
Admission: EM | Admit: 2018-09-02 | Discharge: 2018-09-03 | Disposition: A | Discharge status: Home / Self Care | Payer: Medicare PPO | Attending: Emergency Medicine | Admitting: Emergency Medicine

## 2018-09-02 DIAGNOSIS — I951 Orthostatic hypotension: Secondary | ICD-10-CM

## 2018-09-02 DIAGNOSIS — R110 Nausea: Secondary | ICD-10-CM | POA: Insufficient documentation

## 2018-09-02 DIAGNOSIS — Z79899 Other long term (current) drug therapy: Secondary | ICD-10-CM | POA: Insufficient documentation

## 2018-09-02 DIAGNOSIS — R000 Tachycardia, unspecified: Secondary | ICD-10-CM | POA: Insufficient documentation

## 2018-09-02 DIAGNOSIS — R55 Syncope and collapse: Secondary | ICD-10-CM | POA: Diagnosis present

## 2018-09-02 DIAGNOSIS — G90A Postural orthostatic tachycardia syndrome (POTS): Secondary | ICD-10-CM

## 2018-09-02 LAB — CBC
HCT: 38.2 % (ref 36.0–46.0)
Hemoglobin: 12.6 g/dL (ref 12.0–15.0)
MCH: 34.4 pg — ABNORMAL HIGH (ref 26.0–34.0)
MCHC: 33 g/dL (ref 30.0–36.0)
MCV: 104.4 fL — ABNORMAL HIGH (ref 80.0–100.0)
Platelets: 161 10*3/uL (ref 150–400)
RBC: 3.66 MIL/uL — ABNORMAL LOW (ref 3.87–5.11)
RDW: 12 % (ref 11.5–15.5)
WBC: 4.6 10*3/uL (ref 4.0–10.5)
nRBC: 0 % (ref 0.0–0.2)

## 2018-09-02 LAB — URINALYSIS, ROUTINE W REFLEX MICROSCOPIC
Bilirubin Urine: NEGATIVE
Glucose, UA: NEGATIVE mg/dL
Hgb urine dipstick: NEGATIVE
Ketones, ur: NEGATIVE mg/dL
Leukocytes,Ua: NEGATIVE
Nitrite: NEGATIVE
Protein, ur: NEGATIVE mg/dL
Specific Gravity, Urine: 1.006 (ref 1.005–1.030)
pH: 6 (ref 5.0–8.0)

## 2018-09-02 LAB — BASIC METABOLIC PANEL
Anion gap: 5 (ref 5–15)
BUN: 6 mg/dL (ref 6–20)
CO2: 23 mmol/L (ref 22–32)
Calcium: 9 mg/dL (ref 8.9–10.3)
Chloride: 111 mmol/L (ref 98–111)
Creatinine, Ser: 0.87 mg/dL (ref 0.44–1.00)
GFR calc Af Amer: >60 mL/min (ref >60)
GFR calc non Af Amer: >60 mL/min (ref >60)
Glucose, Bld: 113 mg/dL — ABNORMAL HIGH (ref 70–99)
Potassium: 4.4 mmol/L (ref 3.5–5.1)
Sodium: 139 mmol/L (ref 135–145)

## 2018-09-02 LAB — I-STAT BETA HCG BLOOD, ED (MC, WL, AP ONLY): I-stat hCG, quantitative: <5 m[IU]/mL (ref <5)

## 2018-09-02 LAB — CBG MONITORING, ED: Glucose-Capillary: 69 mg/dL — ABNORMAL LOW (ref 70–99)

## 2018-09-02 LAB — TSH: TSH: 2.033 u[IU]/mL (ref 0.350–4.500)

## 2018-09-02 MED ORDER — SODIUM CHLORIDE 0.9% FLUSH
3.0000 mL | Freq: Once | INTRAVENOUS | Status: AC
  Administered 2018-09-02: 3 mL via INTRAVENOUS

## 2018-09-02 MED ORDER — LACTATED RINGERS IV BOLUS
2000.0000 mL | Freq: Once | INTRAVENOUS | Status: AC
  Administered 2018-09-02: 21:00:00 2000 mL via INTRAVENOUS

## 2018-09-02 MED ORDER — ONDANSETRON HCL 4 MG/2ML IJ SOLN
4.0000 mg | Freq: Once | INTRAMUSCULAR | Status: AC
  Administered 2018-09-02: 4 mg via INTRAVENOUS
  Filled 2018-09-02: qty 2

## 2018-09-02 NOTE — Telephone Encounter (Signed)
Called pt to assess her needs.  She states her POTS sx have been more severe lately. She states her SBP can easily drop into the 60's. She is currently taking Sudafed for BP maintenance, but it is a q12hr dose which is lasting about 3 hrs. Once it wears off, her BP drops and her HR elevates to the 150's. She is unable to take her propranolol due to her BP. She confirms she is drinking her fluid and adding salt to her diet, but she is urinating frequently and is not sure if the additional fluid is sufficient. She is calling today for some advise on what she should do.  I conveyed to Jennifer [name] that Dr Caryl Comes is out of the office until later this week, however I will discuss with him first thing on Thurs. In the meantime, I encouraged her to drink fluids that had electrolyte balance as this may remain in her vascular system a little longer than sodium alone. If she becomes too symptomatic, I advised her to seek care in the ED for fluid management and HR control until Dr. Caryl Comes can give her some recommendation. I educated her on COVID precautions at [facility] as she is a high risk group.   She has verbalized understanding of the above and will await a call back later this week.

## 2018-09-02 NOTE — Discharge Instructions (Signed)
Continue to take your medications as prescribed and try eating small frequent meals.  All your lab work including your thyroid studies were normal today.

## 2018-09-02 NOTE — ED Notes (Signed)
ED Provider at bedside. 

## 2018-09-02 NOTE — ED Provider Notes (Signed)
[facility] Provider Note   CSN: [id number] Arrival date & time: 09/02/18  1806     History   Chief Complaint Chief Complaint  Patient presents with  . Near Syncope    HPI Jennifer [name] is a 53 y.o. female.     Patient is a 53 year old female with a history of PVCs, pots disease, rheumatoid arthritis and depression and anxiety who is presenting today with worsening palpitations, lightheadedness and near syncope.  Patient states Jennifer [name] suffered from pots for the last 6 years and has been recurrent syncope and hits her head often.  Jennifer [name] states for the last several weeks Jennifer [name] has been feeling worse with more episodes of palpitations and lightheadedness.  Jennifer [name] states her blood pressure typically drops to the 60s with standing and heart rates will go up to 150.  Jennifer [name] has had persistent nausea and has not been eating much but has been trying to drink.  Jennifer [name] takes propranolol for this and Sudafed as [facility] but states that the Sudafed helps for about 3 hours but then it wears off and Jennifer [name] cannot take anything else for another 12 hours and the propranolol drops her blood pressure.  Patient denies any fever, cough, shortness of breath, chest pain, abdominal pain, vomiting or diarrhea.  Jennifer [name] has not had any recent medication changes.  Jennifer [name] does follow-up with Dr. Graciela HusbandsKlein her cardiologist regularly and last saw him in early June.  When Jennifer [name] [facility] to [facility] they recommended Jennifer [name] go to the emergency room to ensure that her electrolytes were normal and see if Jennifer [name] can get some fluids to make her feel better.  The history is provided by the patient.  Near Syncope This is a recurrent problem. The problem occurs daily. The problem has been gradually worsening. Associated symptoms include headaches. Pertinent negatives include no chest pain, no abdominal pain and no shortness of breath. The symptoms are aggravated by standing.  Jennifer [name] has tried rest for the symptoms. The treatment provided no relief.    Past Medical History:  Diagnosis Date  . Anxiety   . Arthritis    inactive RA- no meds  . ASD (atrial septal defect)    Closure   . Depression   . Dysrhythmia    PVCs  . Headache(784.0)    migraines  . Hemochromatosis   . IBS (irritable bowel syndrome)   . Orthostatic syncope   . POTS (postural orthostatic tachycardia syndrome)   . Rheumatoid arthritis(714.0) 02/04/2012    Patient Active Problem List   Diagnosis Date Noted  . Seropositive rheumatoid arthritis of multiple sites (HCC) 12/29/2015  . SVT (supraventricular tachycardia) (HCC) 11/25/2015  . Suicide attempt by beta blocker overdose (HCC) 08/11/2013  . POTS (postural orthostatic tachycardia syndrome) 08/11/2013  . Drug overdose, intentional (HCC) 08/11/2013  . Fibromyalgia 10/19/2012  . Restless legs syndrome (RLS) 10/19/2012  . Insomnia 10/19/2012  . Orthostatic syncope/hypotension 10/17/2012  . Major depression, recurrent (HCC) 07/22/2012  . GAD (generalized anxiety disorder) 07/22/2012  . Panic attacks 07/22/2012  . DYSAUTONOMIA 04/07/2009  . HEMOCHROMATOSIS, HX OF 04/07/2009  . Irritable bowel syndrome 09/22/2007  . NAUSEA 09/22/2007  . ABDOMINAL PAIN, LEFT LOWER QUADRANT 09/22/2007  . MIGRAINES, HX OF 09/22/2007    Past Surgical History:  Procedure Laterality Date  . CHOLECYSTECTOMY  1989  . DILATION AND CURETTAGE OF UTERUS  2004  . LAPAROSCOPIC TUBAL LIGATION  10/26/2010   Procedure: LAPAROSCOPIC TUBAL LIGATION;  Surgeon: Zelphia Cairo;  Location: [facility];  Service: Gynecology;  Laterality: Bilateral;  . LAPAROSCOPY    . PATENT FORAMEN OVALE CLOSURE  2008     OB History   No obstetric history on file.      Home Medications    Prior to Admission medications   Medication Sig Start Date End Date Taking? Authorizing Provider  aspirin-acetaminophen-caffeine (EXCEDRIN MIGRAINE) [phone] MG tablet Take 1 tablet by mouth  daily.    [provider]  clonazePAM (KLONOPIN) 1 MG tablet Take 1 mg by mouth 2 (two) times daily. Up to 4 times daily prn for anxiety.    [provider]  cyclobenzaprine (FLEXERIL) 10 MG tablet Take 10 mg by mouth at bedtime.     [provider]  desvenlafaxine (PRISTIQ) 50 MG 24 hr tablet Take 1 tablet by mouth daily. 08/27/17   [provider]  ENBREL MINI 50 MG/ML SOCT Inject 15 mLs into the muscle once a week. 11/01/16   [provider]  fluvoxaMINE (LUVOX) 50 MG tablet  06/28/18   [provider]  ondansetron (ZOFRAN-ODT) 8 MG disintegrating tablet DISSOLVE 1 TABLET(8 MG) INSIDE CHEEK DAILY AS NEEDED FOR NAUSEA OR VOMITING 05/19/18   Duke Salvia, MD  propranolol (INDERAL) 20 MG tablet TAKE 1 TABLET BY MOUTH TWICE DAILY AS NEEDED FOR INCREASE HEART RATE 08/26/17   Duke Salvia, MD  topiramate (TOPAMAX) 100 MG tablet 300 mg as directed.  03/29/17   [provider]    Family History Family History  Problem Relation Age of Onset  . Hemochromatosis Mother   . Hemochromatosis Sister     Social History Social History   Tobacco Use  . Smoking status: Never Smoker  . Smokeless tobacco: Never Used  Substance Use Topics  . Alcohol use: Yes    Comment: occasionally.  . Drug use: No     Allergies   Florinef [fludrocortisone], Leflunomide, Methotrexate derivatives, Reglan [metoclopramide], Remicade [infliximab], Morphine and related, and Sulfonamide derivatives   Review of Systems Review of Systems  Respiratory: Negative for shortness of breath.   Cardiovascular: Positive for near-syncope. Negative for chest pain.  Gastrointestinal: Negative for abdominal pain.  Neurological: Positive for headaches.  All other systems reviewed and are negative.    Physical Exam Updated Vital Signs BP 109/74   Pulse 84   Temp 98.1 F (36.7 C) (Oral)   Resp 14   Ht 5\' 7"  (1.702 m)   Wt 68 kg   SpO2 100%   BMI 23.49 kg/m    Physical Exam Vitals signs and nursing note reviewed.  Constitutional:      General: Jennifer [name] is not in acute distress.    Appearance: Jennifer [name] is well-developed.  HENT:     Head: Normocephalic and atraumatic.  Eyes:     Pupils: Pupils are equal, round, and reactive to light.  Cardiovascular:     Rate and Rhythm: Normal rate and regular rhythm.     Heart sounds: Normal heart sounds. No murmur. No friction rub.  Pulmonary:     Effort: Pulmonary effort is normal.     Breath sounds: Normal breath sounds. No wheezing or rales.  Abdominal:     General: Bowel sounds are normal. There is no distension.     Palpations: Abdomen is soft.     Tenderness: There is no abdominal tenderness. There is no guarding or rebound.  Musculoskeletal: Normal range of motion.        General: No tenderness.  Right lower leg: No edema.     Left lower leg: No edema.     Comments: No edema  Skin:    General: Skin is warm and dry.     Findings: No rash.  Neurological:     General: No focal deficit present.     Mental Status: Jennifer [name] is alert and oriented to person, place, and time. Mental status is at baseline.     Cranial Nerves: No cranial nerve deficit.  Psychiatric:        Mood and Affect: Mood normal.        Behavior: Behavior normal.        Thought Content: Thought content normal.      ED Treatments / Results  Labs (all labs ordered are listed, but only abnormal results are displayed) Labs Reviewed  BASIC METABOLIC PANEL - Abnormal; Notable for the following components:      Result Value   Glucose, Bld 113 (*)    All other components within normal limits  CBC - Abnormal; Notable for the following components:   RBC 3.66 (*)    MCV 104.4 (*)    MCH 34.4 (*)    All other components within normal limits  URINALYSIS, ROUTINE W REFLEX MICROSCOPIC - Abnormal; Notable for the following components:   Color, Urine STRAW (*)    All other components within normal limits  CBG MONITORING, ED - Abnormal;  Notable for the following components:   Glucose-Capillary 69 (*)    All other components within normal limits  TSH  I-STAT BETA HCG BLOOD, ED ([facility], [facility], [facility] ONLY)    EKG EKG Interpretation  Date/Time:  Tuesday September 02 2018 18:04:46 EDT Ventricular Rate:  86 PR Interval:  124 QRS Duration: 68 QT Interval:  350 QTC Calculation: 418 R Axis:   79 Text Interpretation:  Normal sinus rhythm Normal ECG No significant change since last tracing Confirmed by Gwyneth Sprout ([id number]) on 09/02/2018 8:38:18 PM   Radiology No results found.  Procedures Procedures (including critical care time)  Medications Ordered in ED Medications  sodium chloride flush (NS) 0.9 % injection 3 mL (has no administration in time range)  lactated ringers bolus 2,000 mL (has no administration in time range)  ondansetron (ZOFRAN) injection 4 mg (has no administration in time range)     Initial Impression / Assessment and Plan / ED Course  I have reviewed the triage vital signs and the nursing notes.  Pertinent labs & imaging results that were available during my care of the patient were reviewed by me and considered in my medical decision making (see chart for details).       Patient presenting with symptoms of near syncope and worsening symptoms of her pots disease.  Patient states that anytime Jennifer [name] tries to get up and do anything at home Jennifer [name] has tachycardia and hypotension.  Jennifer [name] is mostly just laying in bed and has to have someone with her in the shower so Jennifer [name] does not pass out.  Jennifer [name] has not had any recent illness and has been following up with her doctors.  Jennifer [name] does take propanolol and Sudafed for her symptoms but it does not seem to be helping.  Jennifer [name] states Jennifer [name] is drinking fluids but is not been eating because Jennifer [name] is nauseated.  On exam patient's vital signs are normal with laying down but with standing her blood pressure does drop to this 80s systolic and Jennifer [name] becomes tachycardic in the 115.  Patient has no  infectious  symptoms concerning for COVID, sepsis or urinary tract infection.  Jennifer [name] has had no recent medication changes.  EKG is reassuring.  We will add a TSH but hCG, urine, CBC and CMP are within normal limits.  Will give IV fluids and then recheck orthostatics.  11:12 PM Patient's TSH is within normal limits.  After the first liter of fluid Jennifer [name] is already starting to feel better.  Heart rate is improved with orthostatics but still has blood pressure dropped to the 80s with standing.  Patient is already feeling better and was able to walk to the bathroom.  We will give the second liter but feel patient will then be stable for discharge. Final Clinical Impressions(s) / ED Diagnoses   Final diagnoses:  POTS (postural orthostatic tachycardia syndrome)  Orthostatic hypotension    ED Discharge Orders    None       Blanchie Dessert, MD 09/02/18 2312

## 2018-09-02 NOTE — ED Triage Notes (Addendum)
Pt arrives via EMS. Pt has hx of POTS syndrome. Pt has worsened and cannot get out of bed without feeling like she will pass out. Pt's next appointment is in [place] with her MD- so pt was advised to come to ED. No syncopal episode today. BP 114/74, Hr 90

## 2018-09-02 NOTE — ED Notes (Signed)
Pt given food

## 2018-09-04 ENCOUNTER — Other Ambulatory Visit: Payer: Self-pay

## 2018-09-04 ENCOUNTER — Telehealth (INDEPENDENT_AMBULATORY_CARE_PROVIDER_SITE_OTHER): Payer: Medicare PPO | Admitting: Internal Medicine

## 2018-09-04 ENCOUNTER — Encounter: Payer: Self-pay | Admitting: Internal Medicine

## 2018-09-04 VITALS — BP 69/42 | HR 111 | Ht 67.0 in | Wt 150.0 lb

## 2018-09-04 DIAGNOSIS — R000 Tachycardia, unspecified: Secondary | ICD-10-CM

## 2018-09-04 DIAGNOSIS — Q796 Ehlers-Danlos syndrome, unspecified: Secondary | ICD-10-CM | POA: Diagnosis not present

## 2018-09-04 DIAGNOSIS — I471 Supraventricular tachycardia: Secondary | ICD-10-CM

## 2018-09-04 DIAGNOSIS — G901 Familial dysautonomia [Riley-Day]: Secondary | ICD-10-CM | POA: Diagnosis not present

## 2018-09-04 DIAGNOSIS — I951 Orthostatic hypotension: Secondary | ICD-10-CM

## 2018-09-04 MED ORDER — PSEUDOEPHEDRINE HCL 30 MG PO TABS: 30.0000 mg | ORAL_TABLET | ORAL | 3 refills | Status: DC

## 2018-09-04 NOTE — Progress Notes (Signed)
Electrophysiology TeleHealth Note   Due to national recommendations of social distancing due to COVID 19, an audio/video telehealth visit is felt to be most appropriate for this patient at this time.  See MyChart message from today for the patient's consent to telehealth for [facility].   Date:  09/04/2018   ID:  Jennifer [name], DOB Oct 19, 1965, MRN [id number]  Location: patient's home  Provider location: [address] [place]  Evaluation Performed: Follow-up visit  PCP:  Farris Has, MD  Cardiologist:    Electrophysiologist:  SK   Chief Complaint:  Low BP and dysautonomia  History of Present Illness:    Jennifer [name] is a 53 y.o. female who presents via audio/video conferencing for a telehealth visit today.  Since last being seen in our clinic  the patient reports having continued to struggle with low BP and high HR  She has changed her antidepressants again and stress continues to be at very high levels.  She called [facility] and was referred to the ER earlier this week, notes and labs were reviewed-- she was hydrated with 2L and discharged with only modest and short lived improvement  HR >>150 BP 60-70s  The suda   Previous medications used:  Midodrine-y SE Florinef-y  SE Droxydopa-y SE  Mestinon-y SE Beta blockers-y Ivabradine-y Serotonin drugs-y Anticholinergics-y IV fluids -y Compression-   Recurrent falls with head trauma--   Increase stress of late    Switched to short acting sudafed 7/1       The patient denies symptoms of fevers, chills, cough, or new SOB worrisome for COVID 19.    Past Medical History:  Diagnosis Date  . Anxiety   . Arthritis    inactive RA- no meds  . ASD (atrial septal defect)    Closure   . Depression   . Dysrhythmia    PVCs  . Headache(784.0)    migraines  . Hemochromatosis   . IBS (irritable bowel syndrome)   . Orthostatic syncope   . POTS (postural orthostatic tachycardia syndrome)    . Rheumatoid arthritis(714.0) 02/04/2012    Past Surgical History:  Procedure Laterality Date  . CHOLECYSTECTOMY  1989  . DILATION AND CURETTAGE OF UTERUS  2004  . LAPAROSCOPIC TUBAL LIGATION  10/26/2010   Procedure: LAPAROSCOPIC TUBAL LIGATION;  Surgeon: Zelphia Cairo;  Location: [facility];  Service: Gynecology;  Laterality: Bilateral;  . LAPAROSCOPY    . PATENT FORAMEN OVALE CLOSURE  2008    Current Outpatient Medications  Medication Sig Dispense Refill  . acetaminophen (TYLENOL) 500 MG tablet Take 1,000 mg by mouth every 6 (six) hours as needed for moderate pain.    . citalopram (CELEXA) 10 MG tablet Take 10 mg by mouth daily.    . clonazePAM (KLONOPIN) 1 MG tablet Take 1 mg by mouth every 4 (four) hours as needed for anxiety. Can take up to 6    . cyclobenzaprine (FLEXERIL) 10 MG tablet Take 10 mg by mouth at bedtime.     Marland Kitchen desvenlafaxine (PRISTIQ) 50 MG 24 hr tablet Take 50 mg by mouth daily.     . diphenhydrAMINE (BENADRYL) 25 MG tablet Take 50 mg by mouth every 6 (six) hours as needed for itching or allergies (hives from Enbrel injection site).    Elgie Collard MINI 50 MG/ML SOCT Inject 15 mLs into the muscle once a week.    . ondansetron (ZOFRAN-ODT) 8 MG disintegrating tablet DISSOLVE 1 TABLET(8 MG) INSIDE CHEEK DAILY AS  NEEDED FOR NAUSEA OR VOMITING 30 tablet 3  . OVER THE COUNTER MEDICATION Take 1 Dose by mouth as needed (pain). CBD gummies for pain    . propranolol (INDERAL) 20 MG tablet TAKE 1 TABLET BY MOUTH TWICE DAILY AS NEEDED FOR INCREASE HEART RATE 180 tablet 2  . Pseudoephedrine HCl (SUDAFED 12 HOUR PO) Take 1 tablet by mouth every 12 (twelve) hours.    . topiramate (TOPAMAX) 100 MG tablet Take 200 mg by mouth at bedtime.   4   No current facility-administered medications for this visit.     Allergies:   Florinef [fludrocortisone], Leflunomide, Methotrexate derivatives, Reglan [metoclopramide], Remicade [infliximab], Morphine and related, and Sulfonamide derivatives    Social History:  The patient  reports that she has never smoked. She has never used smokeless tobacco. She reports current alcohol use. She reports that she does not use drugs.   Family History:  The patient's   family history includes Hemochromatosis in her mother and sister.   ROS:  Please see the history of present illness.   All other systems are personally reviewed and negative.    Exam:    Vital Signs:  BP (!) 69/42   Pulse (!) 111   Ht 5\' 7"  (1.702 m)   Wt 150 lb (68 kg)   BMI 23.49 kg/m     Well appearing, alert and conversant, regular work of breathing,  good skin color Eyes- anicteric, neuro- grossly intact, skin- no apparent rash or lesions or cyanosis, mouth- oral mucosa is pink   Labs/Other Tests and Data Reviewed:    Recent Labs: 03/10/2018: ALT 24 09/02/2018: BUN 6; Creatinine, Ser 0.87; Hemoglobin 12.6; Platelets 161; Potassium 4.4; Sodium 139; TSH 2.033   Wt Readings from Last 3 Encounters:  09/04/18 150 lb (68 kg)  09/02/18 150 lb (68 kg)  08/05/18 150 lb (68 kg)     Other studies personally reviewed: Additional studies/ records that were reviewed today include: As above       ASSESSMENT & PLAN:     Dysautonomia with hypotension and tachycardia  Rheumatoid arthritis  Depression-primary and secondary  Arachnodactyly  SVT  Ehlers-Danlos 3 Joint hypermobility  Nocturnal tachycardia  Continues to struggle with nocturnal tachycardia.  Describes heart rates in the 150s.  Mechanism is either primary or secondary; it is my impression that is the latter.  [facility] impression has been that it is the former.  Will take a ZIO Patch to look at onset and offset  Significant psychosocial stress.  [place] talk to psychiatry as to whether she should take her clonazepam on a regular basis.  We will decrease her propranolol from 20--10 mg at bedtime  Reviewed again compressive wear  We will undertake MRI imaging of her head because of multiple falls  and question concussion  We will increase fluid repletion and try liquid IV     COVID 19 screen The patient denies symptoms of COVID 19 at this time.  The importance of social distancing was discussed today.  Follow-up:  As Scheduled    Current medicines are reviewed at length with the patient today.   The patient does not have concerns regarding her medicines.    The following changes were made today:   Liquid IV Talk to [facility] re increasing clonazapam or take regularly Propranolol 10 mg hs Short acting sudafed Will try to arrange call with [facility] with Dr. Nino Parsley from [facility].  Suggested 1-yohimbine HCl approximately 5 mg 3 times daily.  Is  obtained from a health food store 2-octreotide 50 mg subcu 2-3 times a day 3-ZIO monitor to look for mechanism of tachycardia 4-decreasing propranolol and input from psychiatry    Labs/ tests ordered today include:   No orders of the defined types were placed in this encounter.   Future tests ( post COVID )     Patient Risk:  after full review of this patients clinical status, I feel that they are at moderate  risk at this time.  Today, I have spent  36  minutes with the patient with telehealth technology discussing the above.  Signed, Sherryl Manges, MD  09/04/2018 1:06 PM     [facility] [address] [place] [address] [phone] (office) [phone] (fax)

## 2018-09-04 NOTE — Patient Instructions (Signed)
Spoke with pt and she is aware of her new 30mg  sudafed script being send to her pharmacy. She will contact [facility] and discuss treatment options with her physician there.   We will follow up with her next week to determine a follow up day.

## 2018-09-08 MED ORDER — PYRIDOSTIGMINE BROMIDE 60 MG PO TABS: 30.0000 mg | ORAL_TABLET | Freq: Two times a day (BID) | ORAL | 3 refills | Status: DC

## 2018-09-08 NOTE — Telephone Encounter (Signed)
Spoke with pt regarding her mychart messages this weekend. I advised her I would contact Dr. Caryl Comes for some recommendation. She states she has been feeling very poor over the weekend with Bps in the the 50's. She is unsure of what she should do.   I told her she has the option to go to the ED, but she is apprehensive to do so. I will forward to Dr. Caryl Comes to review.

## 2018-09-09 ENCOUNTER — Telehealth: Payer: Medicare PPO | Admitting: Internal Medicine

## 2018-09-11 ENCOUNTER — Telehealth: Payer: Medicare PPO | Admitting: Internal Medicine

## 2018-09-12 NOTE — Telephone Encounter (Signed)
Called and spokw with the patient directly

## 2018-09-23 ENCOUNTER — Other Ambulatory Visit: Payer: Self-pay | Admitting: Internal Medicine

## 2018-09-23 MED ORDER — MIDODRINE HCL 2.5 MG PO TABS: 2.5000 mg | ORAL_TABLET | ORAL | 1 refills | Status: DC

## 2018-09-30 MED ORDER — PYRIDOSTIGMINE BROMIDE 60 MG PO TABS: 60.0000 mg | ORAL_TABLET | Freq: Two times a day (BID) | ORAL | 3 refills | Status: DC

## 2018-11-21 ENCOUNTER — Telehealth: Payer: Self-pay

## 2018-11-21 DIAGNOSIS — S0990XD Unspecified injury of head, subsequent encounter: Secondary | ICD-10-CM

## 2018-11-21 NOTE — Telephone Encounter (Signed)
-----   Message from Deboraha Sprang, MD sent at 11/21/2018 10:11 AM EDT ----- Regarding: RE: MRI Glad to order a brain MRI "falls"  Thanks SK    ----- Message ----- From: Dollene Primrose, RN Sent: 11/14/2018  11:55 AM EDT To: Deboraha Sprang, MD Subject: MRI                                            Hi Dr Merryl Hacker said she fell again and is suspicious of a concussion. I advised her to go to the ED, but she wants to know if you will order a brain MRI.  Let me know what you think!  Deeann Servidio

## 2018-11-21 NOTE — Telephone Encounter (Signed)
Called pt to make her aware of her MRI orders. She will await a call from scheduling to arrange. She states she is currently still on Mestinon and Strattera, discontinuing sudafed and midodrine.   She currently does not need anything additional.

## 2018-12-03 ENCOUNTER — Ambulatory Visit (HOSPITAL_COMMUNITY)
Admission: RE | Admit: 2018-12-03 | Discharge: 2018-12-03 | Disposition: A | Discharge status: Home / Self Care | Payer: Medicare PPO | Source: Ambulatory Visit | Attending: Internal Medicine | Admitting: Internal Medicine

## 2018-12-03 ENCOUNTER — Other Ambulatory Visit: Payer: Self-pay

## 2018-12-03 DIAGNOSIS — S0990XD Unspecified injury of head, subsequent encounter: Secondary | ICD-10-CM | POA: Diagnosis not present

## 2018-12-11 NOTE — Telephone Encounter (Signed)
Dr. Caryl Comes spoke to pt about brain MRI result

## 2019-01-02 ENCOUNTER — Telehealth (INDEPENDENT_AMBULATORY_CARE_PROVIDER_SITE_OTHER): Payer: Medicare PPO | Admitting: Family Medicine

## 2019-01-02 DIAGNOSIS — R059 Cough, unspecified: Secondary | ICD-10-CM

## 2019-01-02 DIAGNOSIS — G90A Postural orthostatic tachycardia syndrome (POTS): Secondary | ICD-10-CM

## 2019-01-02 DIAGNOSIS — Q7962 Hypermobile Ehlers-Danlos syndrome: Secondary | ICD-10-CM | POA: Diagnosis not present

## 2019-01-02 DIAGNOSIS — R05 Cough: Secondary | ICD-10-CM | POA: Diagnosis not present

## 2019-01-02 DIAGNOSIS — J0100 Acute maxillary sinusitis, unspecified: Secondary | ICD-10-CM

## 2019-01-02 DIAGNOSIS — Z7689 Persons encountering health services in other specified circumstances: Secondary | ICD-10-CM

## 2019-01-02 DIAGNOSIS — I498 Other specified cardiac arrhythmias: Secondary | ICD-10-CM

## 2019-01-02 DIAGNOSIS — M797 Fibromyalgia: Secondary | ICD-10-CM

## 2019-01-02 MED ORDER — AMOXICILLIN 500 MG PO CAPS: 500.0000 mg | ORAL_CAPSULE | Freq: Two times a day (BID) | ORAL | 0 refills | Status: DC

## 2019-01-02 MED ORDER — BENZONATATE 100 MG PO CAPS: 100.0000 mg | ORAL_CAPSULE | Freq: Two times a day (BID) | ORAL | 0 refills | Status: DC | PRN

## 2019-01-02 NOTE — Progress Notes (Signed)
Virtual Visit via Video Note  I connected with on 01/02/19 at  1:30 PM EDT by a video enabled telemedicine application 2/2 COVID-19 pandemic and verified that I am speaking with the correct person using two identifiers.  Location patient: home Location provider:work or home office Persons participating in the virtual visit: patient, provider  I discussed the limitations of evaluation and management by telemedicine and the availability of in person appointments. The patient expressed understanding and agreed to proceed.  HPI: Pt is a 53 yo female  Ehlers-Danlos type III, hemochromatosis, RA, fibromyalgia, POTS, SVT s/p ablation, inappropriate tachycardia, scoliosis, h/o PFO s/p closure, IBS, depression, anxiety.  RA -dx'd 8 yrs ago -positive RF and CCP, negative ANA -on embrel  Hemachromatosis -pt endorses family hx -dx'd after her uncle had a liver txp. -followed by Heme/Onc  Fibromyalgia  POTS -dx'd in 2019 -followed by Cardiology in town, Dr. [name].  Referred to [facility].  Followed there by Dr. Earney Hamburg -taking Atomoxetine 18 mg daily -h/o EPS with RF ablation for SVT -now with inappropriate tachycardia.  Take Propranolol 20 mg BID prn for tachycardia  H/o Scoliosis: -states is mild -asymptomatic -does exercises  Acute issues: -Increased head congestion, cough x 3 wks -increased pressure in head giving migraines -at times sore throat and hoarse voice. -Tried delsym, zyrtec, benadryl, tylenol -Denies ear pain/pressure  Past Surgical hx: PFO closure in 2009 Heart ablation 1 yr ago at [facility] cholecystectomy Tubal ligation and D/C  Allergies:  Numerous med allergies  Social Hx: Pt is permanently disabled 2/2 RA.  Pt has 3 sons (a 48 yo and 2 college age sons).  Pt denies alcohol, tobacco, drug use.  Pt states she is allowed 2 drinks per month while on Enbrel.  ROS: See pertinent positives and negatives per HPI.  Family Med hx Mom  Afib Dad- Afib, lung cancer PGM- colon cancer   Past Medical History:  Diagnosis Date  . Anxiety   . Arthritis    inactive RA- no meds  . ASD (atrial septal defect)    Closure   . Depression   . Dysrhythmia    PVCs  . Headache(784.0)    migraines  . Hemochromatosis   . IBS (irritable bowel syndrome)   . Orthostatic syncope   . POTS (postural orthostatic tachycardia syndrome)   . Rheumatoid arthritis(714.0) 02/04/2012    Past Surgical History:  Procedure Laterality Date  . CHOLECYSTECTOMY  1989  . DILATION AND CURETTAGE OF UTERUS  2004  . LAPAROSCOPIC TUBAL LIGATION  10/26/2010   Procedure: LAPAROSCOPIC TUBAL LIGATION;  Surgeon: Zelphia Cairo;  Location: [facility];  Service: Gynecology;  Laterality: Bilateral;  . LAPAROSCOPY    . PATENT FORAMEN OVALE CLOSURE  2008    Family History  Problem Relation Age of Onset  . Hemochromatosis Mother   . Hemochromatosis Sister      Current Outpatient Medications:  .  acetaminophen (TYLENOL) 500 MG tablet, Take 1,000 mg by mouth every 6 (six) hours as needed for moderate pain., Disp: , Rfl:  .  citalopram (CELEXA) 10 MG tablet, Take 10 mg by mouth daily., Disp: , Rfl:  .  clonazePAM (KLONOPIN) 1 MG tablet, Take 1 mg by mouth every 4 (four) hours as needed for anxiety. Can take up to 6, Disp: , Rfl:  .  cyclobenzaprine (FLEXERIL) 10 MG tablet, Take 10 mg by mouth at bedtime. , Disp: , Rfl:  .  desvenlafaxine (PRISTIQ) 50 MG 24 hr tablet, Take 50 mg by  mouth daily. , Disp: , Rfl:  .  diphenhydrAMINE (BENADRYL) 25 MG tablet, Take 50 mg by mouth every 6 (six) hours as needed for itching or allergies (hives from Enbrel injection site)., Disp: , Rfl:  .  ENBREL MINI 50 MG/ML SOCT, Inject 15 mLs into the muscle once a week., Disp: , Rfl:  .  midodrine (PROAMATINE) 2.5 MG tablet, Take 1 tablet (2.5 mg total) by mouth every 4 (four) hours. Up to 3 doses per day., Disp: 252 tablet, Rfl: 1 .  ondansetron (ZOFRAN-ODT) 8 MG disintegrating tablet,  DISSOLVE 1 TABLET(8 MG) INSIDE CHEEK DAILY AS NEEDED FOR NAUSEA OR VOMITING, Disp: 30 tablet, Rfl: 3 .  OVER THE COUNTER MEDICATION, Take 1 Dose by mouth as needed (pain). CBD gummies for pain, Disp: , Rfl:  .  propranolol (INDERAL) 20 MG tablet, TAKE 1 TABLET BY MOUTH TWICE DAILY AS NEEDED FOR INCREASE HEART RATE, Disp: 180 tablet, Rfl: 2 .  pseudoephedrine (SUDAFED) 30 MG tablet, Take 1 tablet (30 mg total) by mouth every 4 (four) hours while awake., Disp: 45 tablet, Rfl: 3 .  pyridostigmine (MESTINON) 60 MG tablet, Take 1 tablet (60 mg total) by mouth 2 (two) times a day., Disp: 90 tablet, Rfl: 3 .  topiramate (TOPAMAX) 100 MG tablet, Take 200 mg by mouth at bedtime. , Disp: , Rfl: 4  EXAM:  VITALS per patient if applicable: RR between 32-35 bpm  GENERAL: alert, oriented, appears thin, but well and in no acute distress  HEENT: atraumatic, conjunctiva clear, no obvious abnormalities on inspection of external nose and ears  NECK: normal movements of the head and neck  LUNGS: on inspection no signs of respiratory distress, breathing rate appears normal, no obvious gross SOB, gasping or wheezing  CV: no obvious cyanosis  MS: moves all visible extremities without noticeable abnormality  PSYCH/NEURO: pleasant and cooperative, no obvious depression or anxiety, speech and thought processing grossly intact  ASSESSMENT AND PLAN:  Discussed the following assessment and plan:  Acute non-recurrent maxillary sinusitis  -given duration of symptoms will start abx.  Advised to f/u next wk if symptoms continue despite treatment.   -pt given precautions - Plan: amoxicillin (AMOXIL) 500 MG capsule  Cough  - Plan: benzonatate (TESSALON) 100 MG capsule  Ehlers-Danlos syndrome type III -hypermobile. Features include POTS, IBS, HAs, chronic pain, mild aortic root dilation -continue supportive care  Hereditary hemochromatosis ([place]) -last LFTs normal on 03/10/18  -will continue to  monitor -continue f/u with Heme/Onc, Dr. Burr Medico  Fibromyalgia -stable  POTS -continue current med Atomoxetine 18 mg daily and high salt diet. -continue checking bp at home. -continue f/u with [facility], Dr. Hardie Pulley  Encounter to establish care -We reviewed the PMH, [place], FH, SH, Meds and Allergies. -We provided refills for any medications we will prescribe as needed. -We addressed current concerns per orders and patient instructions. -We have asked for records for pertinent exams, studies, vaccines and notes from previous providers. -We have advised patient to follow up per instructions below.  F/u in 1-2 months   I discussed the assessment and treatment plan with the patient. The patient was provided an opportunity to ask questions and all were answered. The patient agreed with the plan and demonstrated an understanding of the instructions.   The patient was advised to call back or seek an in-person evaluation if the symptoms worsen or if the condition fails to improve as anticipated.   Billie Ruddy, MD

## 2019-01-07 ENCOUNTER — Telehealth: Payer: Self-pay | Admitting: Family Medicine

## 2019-01-07 NOTE — Telephone Encounter (Signed)
Pt seen Dr. Volanda Napoleon

## 2019-01-07 NOTE — Telephone Encounter (Signed)
Pt states she was given  amoxicillin (AMOXIL) 500 MG capsule    benzonatate (TESSALON) 100 MG capsule   On Friday, but she still isn't feeling better.  Pt would like to know if she should continue with these medications or if they need to be changed.

## 2019-01-09 ENCOUNTER — Encounter: Payer: Self-pay | Admitting: Family Medicine

## 2019-01-09 ENCOUNTER — Other Ambulatory Visit: Payer: Self-pay | Admitting: Family Medicine

## 2019-01-09 DIAGNOSIS — J019 Acute sinusitis, unspecified: Secondary | ICD-10-CM

## 2019-01-09 MED ORDER — AMOXICILLIN-POT CLAVULANATE 875-125 MG PO TABS: 1.0000 | ORAL_TABLET | Freq: Two times a day (BID) | ORAL | 0 refills | Status: DC

## 2019-01-09 MED ORDER — GUAIFENESIN-DM 100-10 MG/5ML PO SYRP: 5.0000 mL | ORAL_SOLUTION | ORAL | 0 refills | Status: DC | PRN

## 2019-01-09 NOTE — Addendum Note (Signed)
Addended by: Grier Mitts R on: 01/09/2019 10:42 AM   Modules accepted: Orders

## 2019-01-09 NOTE — Telephone Encounter (Signed)
Spoke with pt voiced understanding that Dr Volanda Napoleon sent in some Antibiotics and cough medicine to her pharmacy.

## 2019-02-09 ENCOUNTER — Other Ambulatory Visit: Payer: Self-pay | Admitting: Internal Medicine

## 2019-02-09 NOTE — Telephone Encounter (Signed)
Pt's pharmacy is requesting a refill on ondansetron. Would Dr. Caryl Comes like to refill this medication? Please address

## 2019-02-10 MED ORDER — ONDANSETRON 8 MG PO TBDP: ORAL_TABLET | ORAL | 3 refills | Status: DC

## 2019-05-22 ENCOUNTER — Other Ambulatory Visit: Payer: Self-pay | Admitting: Internal Medicine

## 2019-05-27 ENCOUNTER — Telehealth: Payer: Self-pay | Admitting: Nurse Practitioner

## 2019-05-27 NOTE — Telephone Encounter (Signed)
Received a call from Ms. Sinko to reschedule her hem appt. She's a former pt of Dr. Cyndie Chime. She's been rescheduled to see Lacie on 4/8 at 1:45pm. And ;abs at 1:15pm.

## 2019-06-11 ENCOUNTER — Inpatient Hospital Stay: Payer: Medicare PPO

## 2019-06-11 ENCOUNTER — Other Ambulatory Visit: Payer: Self-pay | Admitting: Nurse Practitioner

## 2019-06-11 ENCOUNTER — Inpatient Hospital Stay: Payer: Medicare PPO | Admitting: Nurse Practitioner

## 2019-06-11 DIAGNOSIS — Z862 Personal history of diseases of the blood and blood-forming organs and certain disorders involving the immune mechanism: Secondary | ICD-10-CM

## 2019-07-09 ENCOUNTER — Other Ambulatory Visit: Payer: Self-pay | Admitting: Internal Medicine

## 2019-07-10 NOTE — Telephone Encounter (Signed)
OK to refill needs to schedule appointment for 09/2019

## 2019-07-10 NOTE — Telephone Encounter (Signed)
Pt's pharmacy is requesting a refill on pyridostigmine. Would Dr. [name] like to refill this medication? Please address.

## 2019-07-22 ENCOUNTER — Other Ambulatory Visit: Payer: Self-pay | Admitting: Internal Medicine

## 2019-08-18 ENCOUNTER — Ambulatory Visit: Payer: Medicare PPO | Admitting: Orthopaedic Surgery

## 2019-09-30 ENCOUNTER — Other Ambulatory Visit: Payer: Self-pay | Admitting: Internal Medicine

## 2019-10-16 ENCOUNTER — Other Ambulatory Visit: Payer: Self-pay | Admitting: Internal Medicine

## 2019-10-27 ENCOUNTER — Telehealth: Payer: Self-pay | Admitting: Hematology

## 2019-10-27 NOTE — Telephone Encounter (Signed)
Pt cld to r/s her new hem appt from March for hemochromatosis. Pt has been scheduled to see Dr. Mosetta Putt on 9/10 at 11am. Pt aware to arrive 15 minutes early.

## 2019-11-13 ENCOUNTER — Inpatient Hospital Stay: Payer: Medicare PPO | Admitting: Hematology

## 2020-01-01 ENCOUNTER — Other Ambulatory Visit: Payer: Self-pay | Admitting: *Deleted

## 2020-01-01 ENCOUNTER — Telehealth: Payer: Self-pay | Admitting: Internal Medicine

## 2020-01-01 MED ORDER — PYRIDOSTIGMINE BROMIDE 60 MG PO TABS: 60.0000 mg | ORAL_TABLET | Freq: Two times a day (BID) | ORAL | 1 refills | Status: DC

## 2020-01-01 NOTE — Telephone Encounter (Signed)
*  STAT* If patient is at the pharmacy, call can be transferred to refill team.   1. Which medications need to be refilled? (please list name of each medication and dose if known) pyridostigmine (MESTINON) 60 MG tablet [[id number]  2. Which pharmacy/location (including street and city if local pharmacy) is medication to be sent to? [facility]   3. Do they need a 30 day or 90 day supply? 90  Pt called and stated she has not been in because her father has cancer and past away a few weeks ago.

## 2020-02-11 ENCOUNTER — Telehealth: Payer: Self-pay

## 2020-02-11 ENCOUNTER — Other Ambulatory Visit: Payer: Self-pay

## 2020-02-11 ENCOUNTER — Telehealth (INDEPENDENT_AMBULATORY_CARE_PROVIDER_SITE_OTHER): Payer: Medicare PPO | Admitting: Internal Medicine

## 2020-02-11 VITALS — BP 74/42 | HR 150 | Ht 67.0 in | Wt 159.0 lb

## 2020-02-11 DIAGNOSIS — G901 Familial dysautonomia [Riley-Day]: Secondary | ICD-10-CM | POA: Diagnosis not present

## 2020-02-11 NOTE — Progress Notes (Signed)
Electrophysiology TeleHealth Note   Due to national recommendations of social distancing due to COVID 19, an audio/video telehealth visit is felt to be most appropriate for this patient at this time.  See MyChart message from today for the patient's consent to telehealth for [facility].   Date:  02/11/2020   ID:  Jennifer [name], DOB [birth year], MRN [id number]  Location: patient's home  Provider location: [address], [place]  Evaluation Performed: Follow-up visit  PCP:  Deeann Saint, MD  Cardiologist:    Electrophysiologist:  SK   Chief Complaint:  Low BP and dysautonomia  History of Present Illness:    Jennifer [name] is a 54 y.o. female who presents via audio/video conferencing for a telehealth visit today.  Since last being seen in our clinic  the patient reports having continued to struggle with high HR and low BP{   They worsened this summer and then significantly following the death of her father in December 01, 2022- devestating emotionally and she is "stoic" and BP and HR have been poorly tolerating   Has spent most of her time in bed or chair    She was seen   [facility] 12/2 with multiple complaints and intolerances to multiple med outlined, significant fatigue *and she recounts "no more options" More pain 2/2 rheumatoid  Nauseated w nausea --decreased appetite and decreased PO intake with decreased fluids  Wearing a compression garment feet to abdomen        Previous medications used:  Midodrine-y SE Florinef-y  SE Droxydopa-y SE  Mestinon-y SE Beta blockers-y Ivabradine-y Serotonin drugs-y Anticholinergics-y IV fluids -y Compression- y    The patient denies symptoms of fevers, chills, cough, or new SOB worrisome for COVID 19.    Past Medical History:  Diagnosis Date  . Anxiety   . Arthritis    inactive RA- no meds  . ASD (atrial septal defect)    Closure   . Depression   . Dysrhythmia    PVCs  . Headache(784.0)    migraines  .  Hemochromatosis   . IBS (irritable bowel syndrome)   . Orthostatic syncope   . POTS (postural orthostatic tachycardia syndrome)   . Rheumatoid arthritis(714.0) 02/04/2012    Past Surgical History:  Procedure Laterality Date  . CHOLECYSTECTOMY  1989  . DILATION AND CURETTAGE OF UTERUS  2004  . LAPAROSCOPIC TUBAL LIGATION  10/26/2010   Procedure: LAPAROSCOPIC TUBAL LIGATION;  Surgeon: Zelphia Cairo;  Location: [facility];  Service: Gynecology;  Laterality: Bilateral;  . LAPAROSCOPY    . PATENT FORAMEN OVALE CLOSURE  2008    Current Outpatient Medications  Medication Sig Dispense Refill  . acetaminophen (TYLENOL) 500 MG tablet Take 1,000 mg by mouth every 6 (six) hours as needed for moderate pain.    Marland Kitchen amoxicillin-clavulanate (AUGMENTIN) 875-125 MG tablet Take 1 tablet by mouth 2 (two) times daily. 20 tablet 0  . benzonatate (TESSALON) 100 MG capsule Take 1 capsule (100 mg total) by mouth 2 (two) times daily as needed for cough. 20 capsule 0  . citalopram (CELEXA) 10 MG tablet Take 10 mg by mouth daily.    . clonazePAM (KLONOPIN) 1 MG tablet Take 1 mg by mouth every 4 (four) hours as needed for anxiety. Can take up to 6    . cyclobenzaprine (FLEXERIL) 10 MG tablet Take 10 mg by mouth at bedtime.     Marland Kitchen desvenlafaxine (PRISTIQ) 50 MG 24 hr tablet Take 50 mg by mouth daily.     Marland Kitchen  diphenhydrAMINE (BENADRYL) 25 MG tablet Take 50 mg by mouth every 6 (six) hours as needed for itching or allergies (hives from Enbrel injection site).    Elgie Collard MINI 50 MG/ML SOCT Inject 15 mLs into the muscle once a week.    Marland Kitchen guaiFENesin-dextromethorphan (ROBITUSSIN DM) 100-10 MG/5ML syrup Take 5 mLs by mouth every 4 (four) hours as needed for cough. 118 mL 0  . midodrine (PROAMATINE) 2.5 MG tablet Take 1 tablet (2.5 mg total) by mouth every 4 (four) hours. Up to 3 doses per day. 252 tablet 1  . ondansetron (ZOFRAN-ODT) 8 MG disintegrating tablet DISSOLVE 1 TABLET(8 MG) INSIDE CHEEK DAILY AS NEEDED FOR NAUSEA OR  VOMITING 30 tablet 2  . OVER THE COUNTER MEDICATION Take 1 Dose by mouth as needed (pain). CBD gummies for pain    . propranolol (INDERAL) 20 MG tablet TAKE 1 TABLET BY MOUTH TWICE DAILY AS NEEDED FOR INCREASE HEART RATE 180 tablet 2  . pseudoephedrine (SUDAFED) 30 MG tablet Take 1 tablet (30 mg total) by mouth every 4 (four) hours while awake. 45 tablet 3  . pyridostigmine (MESTINON) 60 MG tablet Take 1 tablet (60 mg total) by mouth 2 (two) times daily. 60 tablet 1  . topiramate (TOPAMAX) 100 MG tablet Take 200 mg by mouth at bedtime.   4   No current facility-administered medications for this visit.    Allergies:   Florinef [fludrocortisone], Leflunomide, Methotrexate derivatives, Reglan [metoclopramide], Remicade [infliximab], Morphine and related, and Sulfonamide derivatives   Social History:  The patient  reports that she has never smoked. She has never used smokeless tobacco. She reports current alcohol use. She reports that she does not use drugs.   Family History:  The patient's   family history includes Hemochromatosis in her mother and sister.   ROS:  Please see the history of present illness.   All other systems are personally reviewed and negative.    Exam:    Vital Signs:  There were no vitals taken for this visit.    Well appearing, alert and conversant, regular work of breathing,  good skin color Eyes- anicteric, neuro- grossly intact, skin- no apparent rash or lesions or cyanosis, mouth- oral mucosa is pink   Labs/Other Tests and Data Reviewed:    Recent Labs: No results found for requested labs within last 8760 hours.   Wt Readings from Last 3 Encounters:  09/04/18 150 lb (68 kg)  09/02/18 150 lb (68 kg)  08/05/18 150 lb (68 kg)     Other studies personally reviewed: Additional studies/ records that were reviewed today include: As above       ASSESSMENT & PLAN:     Dysautonomia with hypotension and tachycardia  Rheumatoid  arthritis  Depression-primary and secondary  Arachnodactyly  SVT  Ehlers-Danlos 3 Joint hypermobility  Nocturnal tachycardia  Diarrhea  In setting of IBS  Pyridostigmine stopped and suggested she followup with her GI  Will push fluids and salt-=- which have been limited by nausea  Goal is 3 litres and 3 gms of sodium  Not exercising  Encouraged grief counseling related to loss of her dad--    Reviewed again compressive wear   Suggested that thigh sleeves and abdominal binder may be easier than tights in terms of fludis and urinating  She is following up with Dr Agustin Cree regarding meds  He told her that he had run out of options and ... devestating         followup 6 montyhs  Current medicines are reviewed at length with the patient today.   The patient does not have concerns regarding her medicines.  The following changes were made today:  none  Labs/ tests ordered today include: none No orders of the defined types were placed in this encounter.    Patient Risk:  after full review of this patients clinical status, I feel that they are at moderate risk at this time.  Today, I have spent 35  minutes with the patient with telehealth technology discussing the above.  Signed, Sherryl Manges, MD  02/11/2020 2:12 PM     [facility] [address] [place] [address] [phone] (office) [phone] (fax)

## 2020-02-11 NOTE — Patient Instructions (Signed)
Medication Instructions:  Your physician recommends that you continue on your current medications as directed. Please refer to the Current Medication list given to you today.  *If you need a refill on your cardiac medications before your next appointment, please call your pharmacy*   Lab Work: None ordered.  If you have labs (blood work) drawn today and your tests are completely normal, you will receive your results only by: Marland Kitchen MyChart Message (if you have MyChart) OR . A paper copy in the mail If you have any lab test that is abnormal or we need to change your treatment, we will call you to review the results.   Testing/Procedures: None ordered.    Follow-Up: At [facility], you and your health needs are our priority.  As part of our continuing mission to provide you with exceptional heart care, we have created designated Provider Care Teams.  These Care Teams include your primary Cardiologist (physician) and Advanced Practice Providers (APPs -  Physician Assistants and Nurse Practitioners) who all work together to provide you with the care you need, when you need it.  We recommend signing up for the patient portal called "MyChart".  Sign up information is provided on this After Visit Summary.  MyChart is used to connect with patients for Virtual Visits (Telemedicine).  Patients are able to view lab/test results, encounter notes, upcoming appointments, etc.  Non-urgent messages can be sent to your provider as well.   To learn more about what you can do with MyChart, go to [email or web address].    Your next appointment:   6 month(s)  The format for your next appointment:   In Person  Provider:   Sherryl Manges, MD   Other Instructions  Fluid and salt intake discussed by Dr [name].  Compressive wear such as thigh sleeves and abdominal binder

## 2020-02-11 NOTE — Telephone Encounter (Signed)
  Patient Consent for Virtual Visit         Jennifer [name] has provided verbal consent on 02/11/2020 for a virtual visit (video or telephone).   CONSENT FOR VIRTUAL VISIT FOR:  Jennifer [name]  By participating in this virtual visit I agree to the following:  I hereby voluntarily request, consent and authorize [facility] and its employed or contracted physicians, physician assistants, nurse practitioners or other licensed health care professionals (the Practitioner), to provide me with telemedicine health care services (the "Services") as deemed necessary by the treating Practitioner. I acknowledge and consent to receive the Services by the Practitioner via telemedicine. I understand that the telemedicine visit will involve communicating with the Practitioner through live audiovisual communication technology and the disclosure of certain medical information by electronic transmission. I acknowledge that I have been given the opportunity to request an in-person assessment or other available alternative prior to the telemedicine visit and am voluntarily participating in the telemedicine visit.  I understand that I have the right to withhold or withdraw my consent to the use of telemedicine in the course of my care at any time, without affecting my right to future care or treatment, and that the Practitioner or I may terminate the telemedicine visit at any time. I understand that I have the right to inspect all information obtained and/or recorded in the course of the telemedicine visit and may receive copies of available information for a reasonable fee.  I understand that some of the potential risks of receiving the Services via telemedicine include:  Marland Kitchen Delay or interruption in medical evaluation due to technological equipment failure or disruption; . Information transmitted may not be sufficient (e.g. poor resolution of images) to allow for appropriate medical decision making by the Practitioner;  and/or  . In rare instances, security protocols could fail, causing a breach of personal health information.  Furthermore, I acknowledge that it is my responsibility to provide information about my medical history, conditions and care that is complete and accurate to the best of my ability. I acknowledge that Practitioner's advice, recommendations, and/or decision may be based on factors not within their control, such as incomplete or inaccurate data provided by me or distortions of diagnostic images or specimens that may result from electronic transmissions. I understand that the practice of medicine is not an exact science and that Practitioner makes no warranties or guarantees regarding treatment outcomes. I acknowledge that a copy of this consent can be made available to me via my patient portal [facility] MyChart), or I can request a printed copy by calling the office of [facility].    I understand that my insurance will be billed for this visit.   I have read or had this consent read to me. . I understand the contents of this consent, which adequately explains the benefits and risks of the Services being provided via telemedicine.  . I have been provided ample opportunity to ask questions regarding this consent and the Services and have had my questions answered to my satisfaction. . I give my informed consent for the services to be provided through the use of telemedicine in my medical care

## 2020-02-20 IMAGING — MR MR HEAD W/O CM
9 of 10 series · 36 of 48 positions shown · non-contrast
Comparison: Head CT 10/18/2012, brain MRI 03/16/2010

CLINICAL DATA: Traumatic injury of head, subsequent encounter.
Assess for past/current concussion; head trauma, ataxia.

EXAM:
MRI HEAD WITHOUT CONTRAST
TECHNIQUE: Multiplanar, multiecho pulse sequences of the brain and surrounding
structures were obtained without intravenous contrast.

[Series 3: DWI · axial · 3.0mm · 1.09mm/px · z∈[-76,+75]mm · 9 of 104 slices shown (1 of 4)]
[im 1/104]
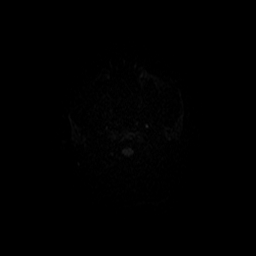
[im 13/104]
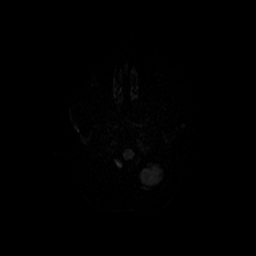
[im 26/104]
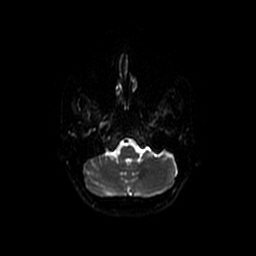
[im 39/104]
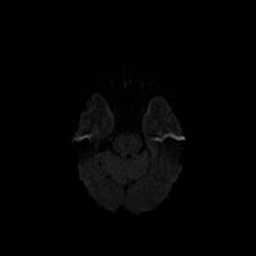
[im 52/104]
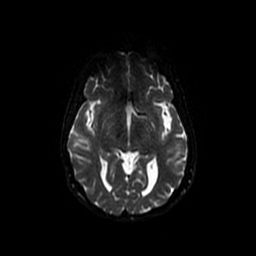
[im 65/104]
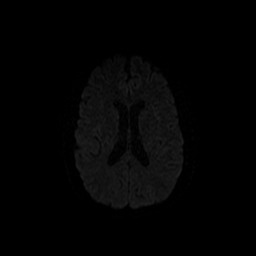
[im 78/104]
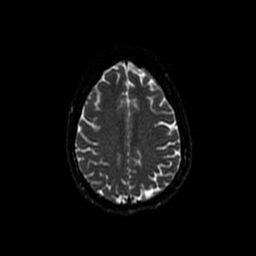
[im 91/104]
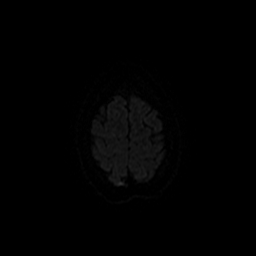
[im 104/104]
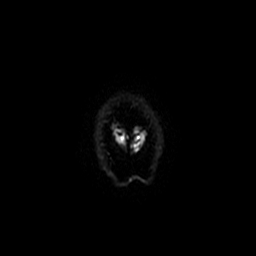

[Series 4: FLAIR · axial · 3.0mm · 0.47mm/px · z∈[-73,+76]mm · 3 of 26 slices shown]
[im 1/26]
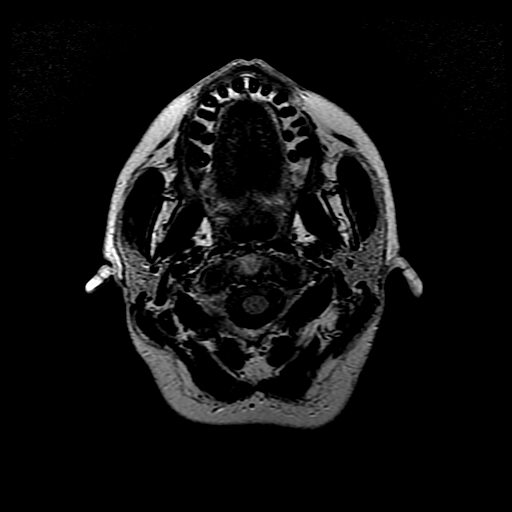
[im 13/26]
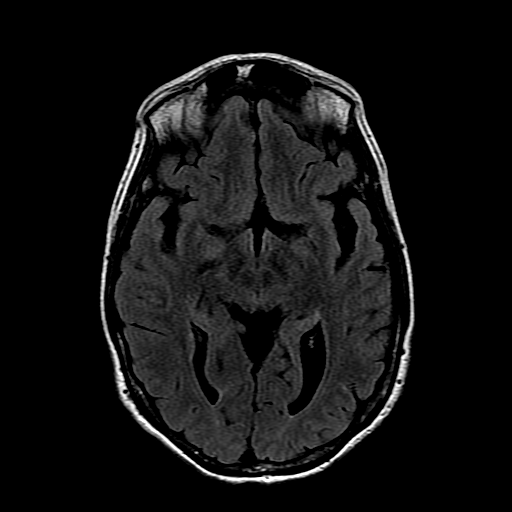
[im 26/26]
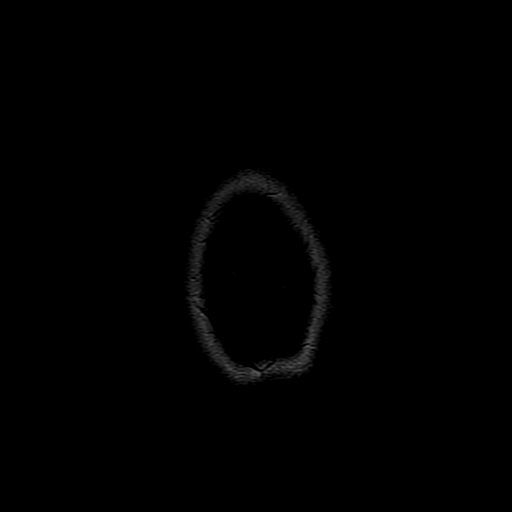

[Series 6: T2 · axial · 5.0mm · 0.47mm/px · z∈[-73,+76]mm · 3 of 26 slices shown (1 of 2)]
[im 1/26]
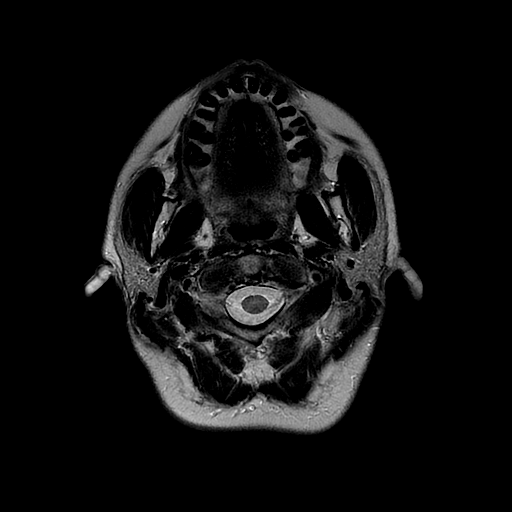
[im 13/26]
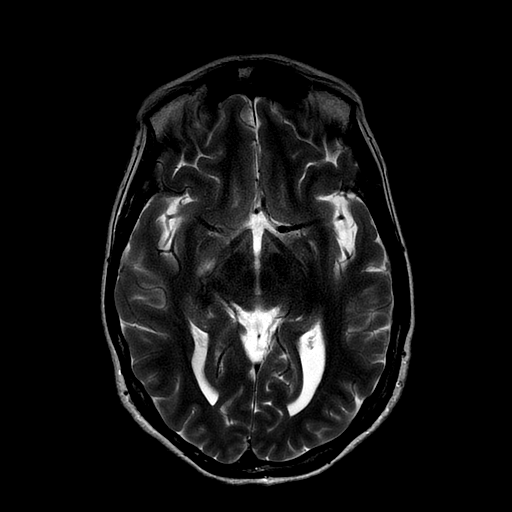
[im 26/26]
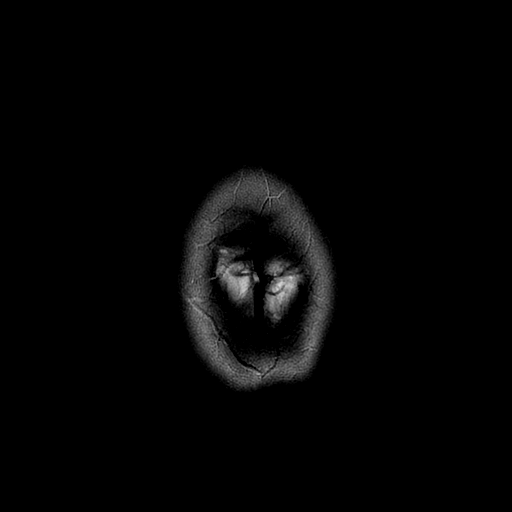

[Series 7: DWI · coronal · 5.0mm · 1.09mm/px · 7 of 72 slices shown (2 of 4)]
[im 1/72]
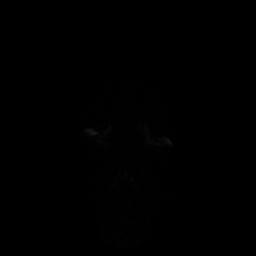
[im 12/72]
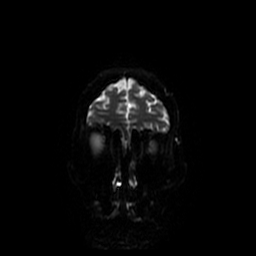
[im 24/72]
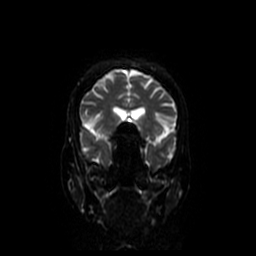
[im 36/72]
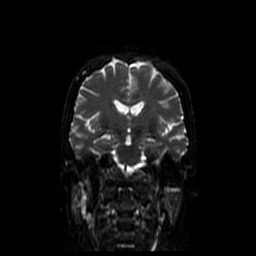
[im 48/72]
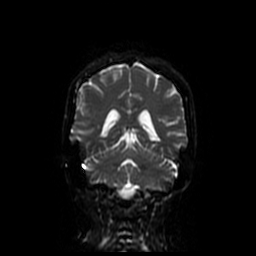
[im 60/72]
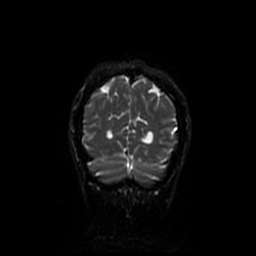
[im 72/72]
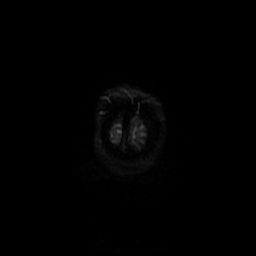

[Series 8: T1 · sagittal · 5.0mm · 0.47mm/px · 2 of 23 slices shown (1 of 2)]
[im 1/23]
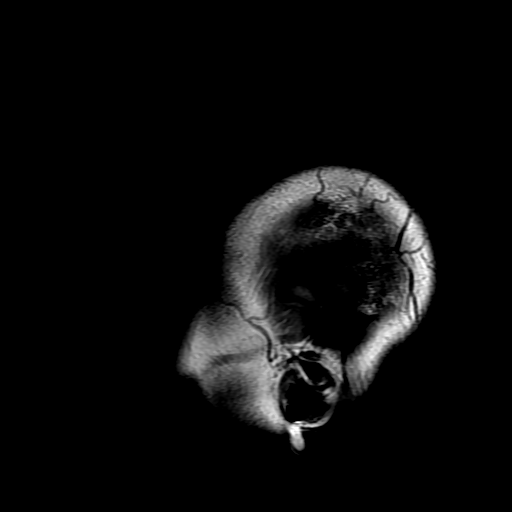
[im 23/23]
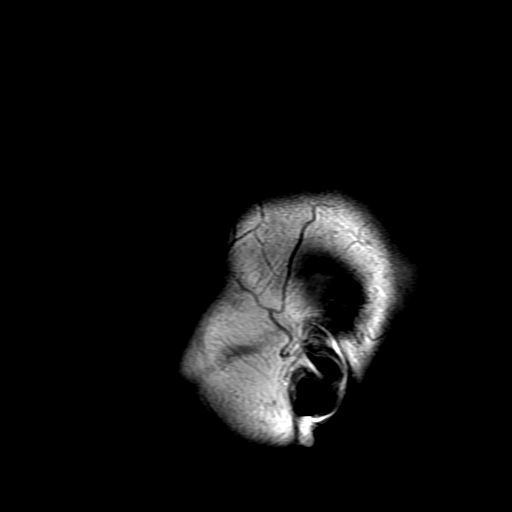

[Series 9: T1 · axial · 3.0mm · 0.47mm/px · 1 of 104 slices shown (2 of 2)]
[im 1/104]
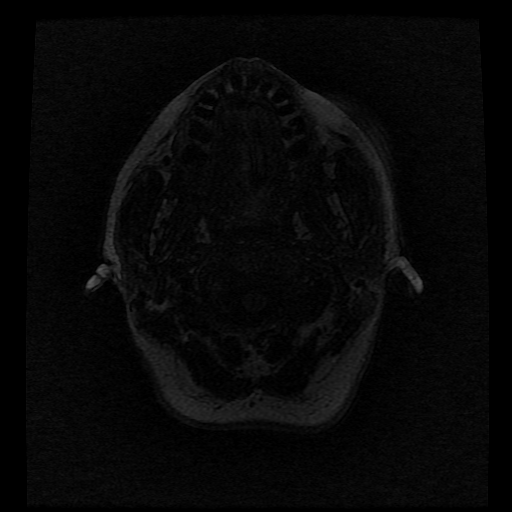

[Series 10: T2 · coronal · 5.0mm · 0.43mm/px · 3 of 30 slices shown (2 of 2)]
[im 1/30]
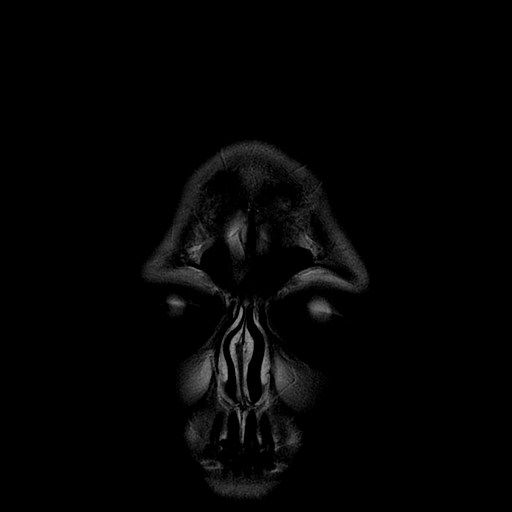
[im 15/30]
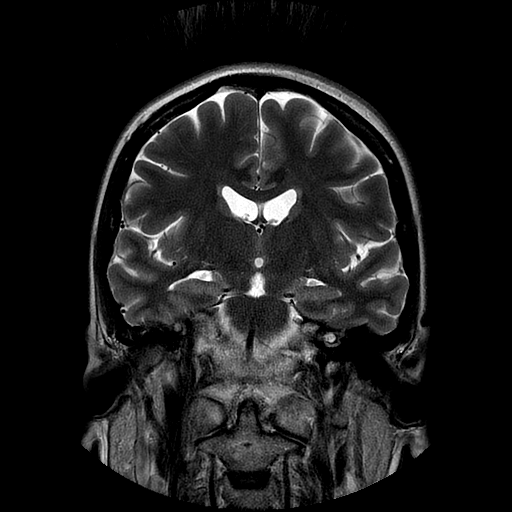
[im 30/30]
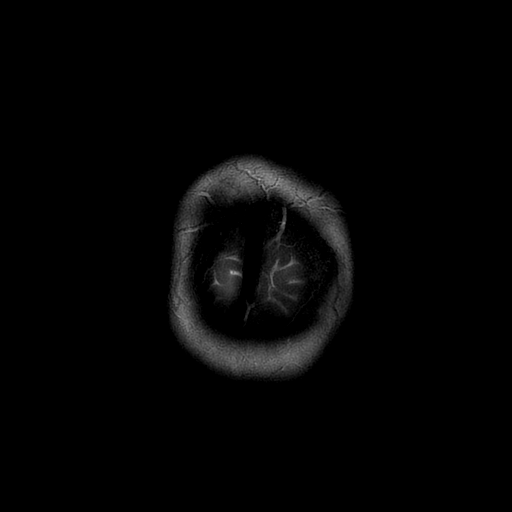

[Series 300: DWI · axial · 3.0mm · 1.09mm/px · z∈[-76,+75]mm · 5 of 52 slices shown (3 of 4)]
[im 1/52]
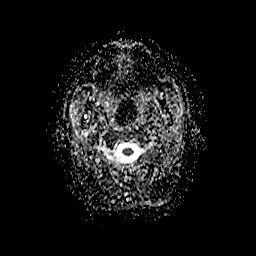
[im 13/52]
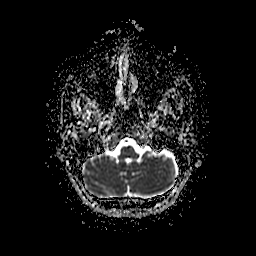
[im 26/52]
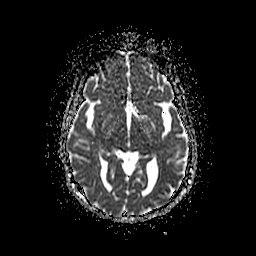
[im 39/52]
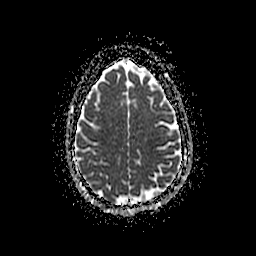
[im 52/52]
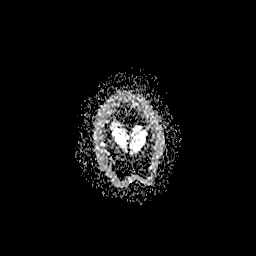

[Series 700: DWI · coronal · 5.0mm · 1.09mm/px · 3 of 36 slices shown (4 of 4)]
[im 1/36]
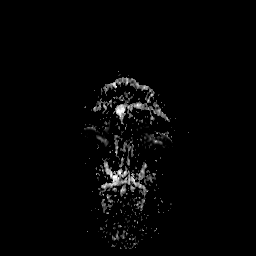
[im 18/36]
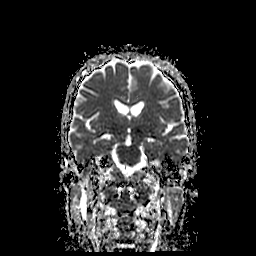
[im 36/36]
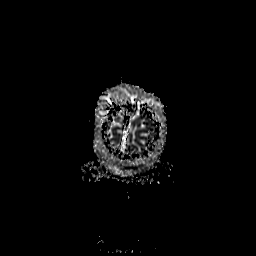

[36 of 48 positions shown; findings below may reference images not displayed]

FINDINGS: Brain:

There is no evidence of acute infarct.

No evidence of intracranial mass.

No midline shift or extra-axial fluid collection.

No chronic intracranial blood products.

A few scattered punctate foci of T2/FLAIR hyperintensity within the
cerebral white matter are not unexpected in a patient of this age.
No other focal parenchymal signal abnormality.

Cerebral volume is normal for age.

Vascular: Flow voids maintained within the proximal large arterial
vessels.

Skull and upper cervical spine: No focal marrow lesion.

Sinuses/Orbits: Visualized orbits demonstrate no acute abnormality.
No significant paranasal sinus disease or mastoid effusion.
IMPRESSION: Normal MRI appearance of the brain for age. No chronic intracranial
blood products or evidence of prior parenchymal contusion.

## 2020-03-21 ENCOUNTER — Other Ambulatory Visit: Payer: Self-pay | Admitting: Internal Medicine

## 2020-03-21 NOTE — Telephone Encounter (Signed)
Okay to refill? Please advise. Thanks, MI 

## 2020-06-21 DIAGNOSIS — Z136 Encounter for screening for cardiovascular disorders: Secondary | ICD-10-CM | POA: Diagnosis not present

## 2020-06-21 DIAGNOSIS — F331 Major depressive disorder, recurrent, moderate: Secondary | ICD-10-CM | POA: Diagnosis not present

## 2020-06-21 DIAGNOSIS — E538 Deficiency of other specified B group vitamins: Secondary | ICD-10-CM | POA: Diagnosis not present

## 2020-06-21 DIAGNOSIS — F4310 Post-traumatic stress disorder, unspecified: Secondary | ICD-10-CM | POA: Diagnosis not present

## 2020-06-21 DIAGNOSIS — E559 Vitamin D deficiency, unspecified: Secondary | ICD-10-CM | POA: Diagnosis not present

## 2020-06-21 DIAGNOSIS — F32A Depression, unspecified: Secondary | ICD-10-CM | POA: Diagnosis not present

## 2020-06-21 DIAGNOSIS — F411 Generalized anxiety disorder: Secondary | ICD-10-CM | POA: Diagnosis not present

## 2020-06-21 DIAGNOSIS — F332 Major depressive disorder, recurrent severe without psychotic features: Secondary | ICD-10-CM | POA: Diagnosis not present

## 2020-06-21 DIAGNOSIS — F339 Major depressive disorder, recurrent, unspecified: Secondary | ICD-10-CM | POA: Diagnosis not present

## 2020-08-16 ENCOUNTER — Ambulatory Visit: Payer: Medicare PPO | Admitting: Internal Medicine

## 2020-08-18 ENCOUNTER — Ambulatory Visit: Payer: Medicare PPO | Admitting: Internal Medicine

## 2020-08-18 ENCOUNTER — Ambulatory Visit: Payer: Medicare PPO | Admitting: Family Medicine

## 2020-08-18 ENCOUNTER — Other Ambulatory Visit: Payer: Self-pay

## 2020-08-18 ENCOUNTER — Encounter: Payer: Self-pay | Admitting: Internal Medicine

## 2020-08-18 VITALS — BP 90/60 | HR 72 | Ht 67.0 in | Wt 159.0 lb

## 2020-08-18 DIAGNOSIS — G901 Familial dysautonomia [Riley-Day]: Secondary | ICD-10-CM | POA: Diagnosis not present

## 2020-08-18 DIAGNOSIS — I951 Orthostatic hypotension: Secondary | ICD-10-CM

## 2020-08-18 DIAGNOSIS — I471 Supraventricular tachycardia: Secondary | ICD-10-CM | POA: Diagnosis not present

## 2020-08-18 NOTE — Patient Instructions (Signed)

## 2020-08-18 NOTE — Progress Notes (Signed)
Patient ID: Jennifer [name], female   DOB: Jul 17, 1965, 55 y.o.   MRN: [id number]       Patient Care Team: Deeann Saint, MD as PCP - General (Family Medicine) Levert Feinstein, MD as Consulting Physician (Oncology) Duke Salvia, MD as Consulting Physician (Cardiology)   HPI  [name] is a 55 y.o. female Seen in followup for dysautonomia manifested by hypotension and tachy palpitations in the context of rheumatoid arthritis hemochromatosis and Ehlers-Danlos syndrome; also felt to have SVT (See Below)   It was my intention to have her seen by Dr. Grayce Sessions or Dr. Bascom Levels at [facility]   She also has primary and secondary anxiety and depression.  She has previously been treated with Florinef and ProAmatine the former she did not tolerate because of migraines the latter was discontinued following tilt table testing at [facility] where she had predominant tachycardia; at that juncture her blood pressures were better and low-dose beta blockers were attempted. She failed to tolerate these.  She is also been seen by Dr. Vilinda Blanks at [facility], most recently 12/21; his last note described not taking yohimbine, Strattera,  Sudafed, fludrocortisone.  She is on pyridostigmine With daily diarrhea add midodrine which aggravate migraines and make her itch; orthostatic measurements at that visit had a increase in pulse from 65--118 with standing and stable blood pressure 94--101.  She is pursuing recumbent exercise bicycle as well as yoga. This is a been somewhat better. She has significant fatigue. She has not tolerated Wellbutrin in the past.  She is doing better since last visit.She is doing self care and receiving mental support at [facility] in [place]   Recently she been experiencing frequent palpitations at night.   She stop exercising on [place] due to lightheadedness sensation. She have been active doing yoga and riding the bicycle .  Her feeling fatigue is better now  Hydration  is repleted: she notes she have been taking liquid IV, Gatorade and propel water Sodium is replete  She is maintaining medications as directed     The patient denies chest pain, shortness of breath, nocturnal dyspnea, orthopnea or peripheral edema.There haven no syncope.    She have been spending a lot of time with family . Will be taking a Family Trip out of the country in Aug/22   DATE TEST EF   08/17 Echo  55-60%    DATE Cr K Hgb  6/20 6  4.4  12.6  12/21   12.0  04/22   11.6    Past Medical History:  Diagnosis Date   Anxiety    Arthritis    inactive RA- no meds   ASD (atrial septal defect)    Closure    Depression    Dysrhythmia    PVCs   Headache(784.0)    migraines   Hemochromatosis    IBS (irritable bowel syndrome)    Orthostatic syncope    POTS (postural orthostatic tachycardia syndrome)    Rheumatoid arthritis(714.0) 02/04/2012    Past Surgical History:  Procedure Laterality Date   CHOLECYSTECTOMY  1989   DILATION AND CURETTAGE OF UTERUS  2004   LAPAROSCOPIC TUBAL LIGATION  10/26/2010   Procedure: LAPAROSCOPIC TUBAL LIGATION;  Surgeon: Zelphia Cairo;  Location: [facility];  Service: Gynecology;  Laterality: Bilateral;   LAPAROSCOPY     PATENT FORAMEN OVALE CLOSURE  2008    Current Outpatient Medications  Medication Sig Dispense Refill   acetaminophen (TYLENOL) 500 MG tablet Take 1,000 mg by  mouth every 6 (six) hours as needed for moderate pain.     amoxicillin-clavulanate (AUGMENTIN) 875-125 MG tablet Take 1 tablet by mouth 2 (two) times daily. 20 tablet 0   benzonatate (TESSALON) 100 MG capsule Take 1 capsule (100 mg total) by mouth 2 (two) times daily as needed for cough. 20 capsule 0   citalopram (CELEXA) 10 MG tablet Take 10 mg by mouth daily.     clonazePAM (KLONOPIN) 1 MG tablet Take 1 mg by mouth every 4 (four) hours as needed for anxiety. Can take up to 6     cyclobenzaprine (FLEXERIL) 10 MG tablet Take 10 mg by mouth at bedtime.      desvenlafaxine (PRISTIQ) 50 MG 24 hr tablet Take 50 mg by mouth daily.      diphenhydrAMINE (BENADRYL) 25 MG tablet Take 50 mg by mouth every 6 (six) hours as needed for itching or allergies (hives from Enbrel injection site).     ENBREL MINI 50 MG/ML SOCT Inject 15 mLs into the muscle once a week.     guaiFENesin-dextromethorphan (ROBITUSSIN DM) 100-10 MG/5ML syrup Take 5 mLs by mouth every 4 (four) hours as needed for cough. 118 mL 0   midodrine (PROAMATINE) 2.5 MG tablet Take 1 tablet (2.5 mg total) by mouth every 4 (four) hours. Up to 3 doses per day. 252 tablet 1   ondansetron (ZOFRAN-ODT) 8 MG disintegrating tablet DISSOLVE 1 TABLET(8 MG) INSIDE CHEEK DAILY AS NEEDED FOR NAUSEA OR VOMITING 30 tablet 2   OVER THE COUNTER MEDICATION Take 1 Dose by mouth as needed (pain). CBD gummies for pain     propranolol (INDERAL) 20 MG tablet TAKE 1 TABLET BY MOUTH TWICE DAILY AS NEEDED FOR INCREASE HEART RATE (Patient taking differently: 10 mg. Takes TID and 10mg -20mg  prn) 180 tablet 2   pseudoephedrine (SUDAFED) 30 MG tablet Take 1 tablet (30 mg total) by mouth every 4 (four) hours while awake. 45 tablet 3   topiramate (TOPAMAX) 100 MG tablet Take 200 mg by mouth at bedtime.   4   No current facility-administered medications for this visit.    Allergies  Allergen Reactions   Florinef [Fludrocortisone] Other (See Comments)    Severe migraine   Leflunomide Diarrhea   Methotrexate Derivatives Hives   Reglan [Metoclopramide]     Jerky    Remicade [Infliximab] Hives, Diarrhea and Other (See Comments)    Increase heart rate,decrease blood pressure   Morphine And Related Hives and Rash    Rxn to IV morphine; has never had PO morphine   Sulfonamide Derivatives Rash   Physical Exam: BP 90/60 (BP Location: Left Arm, Patient Position: Sitting, Cuff Size: Normal)   Pulse 72   Ht 5\' 7"  (1.702 m)   Wt 159 lb (72.1 kg)   SpO2 98%   BMI 24.90 kg/m  Well developed and well nourished in no acute  distress HENT normal Neck supple with JVP-flat Lungs Clear Device pocket well healed; without hematoma or erythema.  There is no tethering  Regular rate and rhythm, No  gallop No  murmur Abd-soft with active BS No Clubbing cyanosis No LE edema Skin-warm and dry A & Oriented  Grossly normal sensory and motor function  EKG: Sinus at 72  13/07/38  Assessment and  Plan  Dysautonomia  Rheumatoid arthritis  Depression-primary and secondary  Hemochromatosis-homozygous  Arachnodactyly  Mental health is much improved following her having gone to [place].  She will be doing EMDR for her PTSD and is looking forward  to that  She is more physically active and this is probably contributing to overall reduction in symptoms related to her dysautonomia.   Continues with nocturnal palpitations.  She is using propranolol 20 mg at night which we will continue with 10 mg as needed.  Unable to tolerate most of the vasoactive drugs.  She is continuing with sodium and fluid repletion with liquid IV and is now beginning to be able to exercise      I,Stephanie Williams,acting as a scribe for Sherryl Manges, MD.,have documented all relevant documentation on the behalf of Sherryl Manges, MD,as directed by  Sherryl Manges, MD while in the presence of Sherryl Manges, MD.  I, Sherryl Manges, MD, have reviewed all documentation for this visit. The documentation on 08/18/20 for the exam, diagnosis, procedures, and orders are all accurate and complete.

## 2020-08-23 ENCOUNTER — Other Ambulatory Visit: Payer: Self-pay

## 2020-08-24 ENCOUNTER — Ambulatory Visit: Payer: Medicare PPO | Admitting: Family Medicine

## 2020-09-07 ENCOUNTER — Ambulatory Visit: Payer: Medicare PPO | Admitting: Family Medicine

## 2020-09-26 ENCOUNTER — Telehealth: Payer: Self-pay | Admitting: Family Medicine

## 2020-09-26 NOTE — Telephone Encounter (Signed)
Left message for patient to call back and schedule Medicare Annual Wellness Visit (AWV) either virtually or in office.   AWVI 05/04/2018 please schedule at anytime with [facility] Nurse Health Advisor 1 or 2   This should be a 45 minute visit.

## 2020-09-28 ENCOUNTER — Ambulatory Visit: Payer: Medicare PPO | Admitting: Family Medicine

## 2020-09-28 ENCOUNTER — Encounter: Payer: Self-pay | Admitting: Family Medicine

## 2020-09-28 ENCOUNTER — Other Ambulatory Visit: Payer: Self-pay

## 2020-09-28 DIAGNOSIS — M0579 Rheumatoid arthritis with rheumatoid factor of multiple sites without organ or systems involvement: Secondary | ICD-10-CM | POA: Diagnosis not present

## 2020-09-28 DIAGNOSIS — I498 Other specified cardiac arrhythmias: Secondary | ICD-10-CM | POA: Diagnosis not present

## 2020-09-28 DIAGNOSIS — G90A Postural orthostatic tachycardia syndrome (POTS): Secondary | ICD-10-CM

## 2020-09-28 DIAGNOSIS — I471 Supraventricular tachycardia, unspecified: Secondary | ICD-10-CM

## 2020-09-28 DIAGNOSIS — R630 Anorexia: Secondary | ICD-10-CM

## 2020-09-28 DIAGNOSIS — M5442 Lumbago with sciatica, left side: Secondary | ICD-10-CM | POA: Diagnosis not present

## 2020-09-28 DIAGNOSIS — M5441 Lumbago with sciatica, right side: Secondary | ICD-10-CM

## 2020-09-28 DIAGNOSIS — R5383 Other fatigue: Secondary | ICD-10-CM

## 2020-09-28 NOTE — Progress Notes (Signed)
Subjective:    Patient ID: Jennifer [name], female    DOB: [birth year], 55 y.o.   MRN: [id number]  No chief complaint on file.   HPI Patient is a 55 yo female with pmh sig for hemochromatosis, POTS, fibromyalgia, IBS, depression, anxiety, RLS, RA who was seen today for f/u.  Pt had an elevated ferritin level 373 in March.  Endroses dry eyes, dry pruritic skin.  Tried to schedule appt with Hematology, advised needed a referral as her provider retired.  Pt in perimenopause, LMP Oct.  Bleeding was slightly heavier than spotting.  Followed by provider at [facility] yearly for POTS.  Inappropriate tachycardia felt mostly at night and laying down.  Trying to drink at least 64 ounces of water per day and 1 g of salt.  Past Medical History:  Diagnosis Date   Anxiety    Arthritis    inactive RA- no meds   ASD (atrial septal defect)    Closure    Depression    Dysrhythmia    PVCs   Headache(784.0)    migraines   Hemochromatosis    IBS (irritable bowel syndrome)    Orthostatic syncope    POTS (postural orthostatic tachycardia syndrome)    Rheumatoid arthritis(714.0) 02/04/2012    Allergies  Allergen Reactions   Florinef [Fludrocortisone] Other (See Comments)    Severe migraine   Leflunomide Diarrhea   Methotrexate Derivatives Hives   Reglan [Metoclopramide]     Jerky    Remicade [Infliximab] Hives, Diarrhea and Other (See Comments)    Increase heart rate,decrease blood pressure   Morphine And Related Hives and Rash    Rxn to IV morphine; has never had PO morphine   Sulfonamide Derivatives Rash    ROS General: Denies fever, chills, night sweats, changes in weight  +fatigue, changes in appetite HEENT: Denies headaches, ear pain, changes in vision, rhinorrhea, sore throat +dry eyes CV: Denies CP, palpitations, SOB, orthopnea  +tachycardia Pulm: Denies SOB, cough, wheezing GI: Denies abdominal pain, nausea, vomiting, diarrhea, constipation GU: Denies dysuria, hematuria, frequency,  vaginal discharge Msk: Denies muscle cramps  +fibromyalgia, jt pain Neuro: Denies weakness, numbness, tingling Skin: Denies rashes, bruising  +dry, pruritic skin Psych: Denies depression, anxiety, hallucinations    Objective:    Blood pressure 100/80, pulse 81, temperature 98 F (36.7 C), temperature source Oral, weight 157 lb 6.4 oz (71.4 kg), SpO2 98 %.  Gen. Pleasant, well-nourished, in no distress, normal affect   HEENT: [place]/AT, face symmetric, conjunctiva clear, no scleral icterus, PERRLA, EOMI, nares patent without drainage Lungs: no accessory muscle use, CTAB, no wheezes or rales Cardiovascular: RRR, no m/r/g, no peripheral edema Musculoskeletal: No deformities, no cyanosis or clubbing, normal tone Neuro:  A&Ox3, CN II-XII intact, normal gait Skin:  Warm, no lesions/ rash.  No bronze discoloration of skin.   Wt Readings from Last 3 Encounters:  09/28/20 157 lb 6.4 oz (71.4 kg)  08/18/20 159 lb (72.1 kg)  02/11/20 159 lb (72.1 kg)    Lab Results  Component Value Date   WBC 4.6 09/02/2018   HGB 12.6 09/02/2018   HCT 38.2 09/02/2018   PLT 161 09/02/2018   GLUCOSE 113 (H) 09/02/2018   ALT 24 03/10/2018   AST 23 03/10/2018   NA 139 09/02/2018   K 4.4 09/02/2018   CL 111 09/02/2018   CREATININE 0.87 09/02/2018   BUN 6 09/02/2018   CO2 23 09/02/2018   TSH 2.033 09/02/2018    Assessment/Plan:  Hereditary hemochromatosis (HCC)  -  previously stable.  Pt reports elevated ferritin 4 months ago. -will obtain labs. -for elevation in labs hepatic u/s and phlebotomy.   - Plan: CMP, CBC with Differential/Platelet, Hemoglobin A1c, IBC + Ferritin, IBC + Ferritin  POTS (postural orthostatic tachycardia syndrome)  -continue increased fluid and sodium intake -continue f/u with specialist at [facility] yrly - Plan: TSH, T4, Free  Other fatigue -possibly related to chronic conditions such as fibromyalgia, depression, hypotension, RA -will obtain labs to assess thyroid  function.  - Plan: TSH, T4, Free  SVT (supraventricular tachycardia) (HCC) -stable -continue propranolol   Right-sided low back pain with bilateral sciatica, unspecified chronicity  - Plan: POCT urinalysis dipstick  Seropositive rheumatoid arthritis of multiple sites (HCC) -continue embrel -continue f/u with Rheumatology  Decreased appetite -possibly 2/2 GERD, IBS, medications -consider small meals throughout the day or supplemental drinks. -f/u with GI  F/u prn  Abbe Amsterdam, MD

## 2020-09-29 DIAGNOSIS — Z79899 Other long term (current) drug therapy: Secondary | ICD-10-CM | POA: Diagnosis not present

## 2020-09-29 DIAGNOSIS — M0579 Rheumatoid arthritis with rheumatoid factor of multiple sites without organ or systems involvement: Secondary | ICD-10-CM | POA: Diagnosis not present

## 2020-09-29 DIAGNOSIS — R5383 Other fatigue: Secondary | ICD-10-CM | POA: Diagnosis not present

## 2020-09-29 DIAGNOSIS — R5382 Chronic fatigue, unspecified: Secondary | ICD-10-CM | POA: Diagnosis not present

## 2020-09-29 DIAGNOSIS — Z6824 Body mass index (BMI) 24.0-24.9, adult: Secondary | ICD-10-CM | POA: Diagnosis not present

## 2020-09-29 DIAGNOSIS — M797 Fibromyalgia: Secondary | ICD-10-CM | POA: Diagnosis not present

## 2020-09-29 LAB — COMPREHENSIVE METABOLIC PANEL
ALT: 13 U/L (ref 0–35)
AST: 15 U/L (ref 0–37)
Albumin: 3.6 g/dL (ref 3.5–5.2)
Alkaline Phosphatase: 54 U/L (ref 39–117)
BUN: 7 mg/dL (ref 6–23)
CO2: 23 mEq/L (ref 19–32)
Calcium: 8.5 mg/dL (ref 8.4–10.5)
Chloride: 107 mEq/L (ref 96–112)
Creatinine, Ser: 0.87 mg/dL (ref 0.40–1.20)
GFR: 76.37 mL/min (ref >60.00)
Glucose, Bld: 97 mg/dL (ref 70–99)
Potassium: 3.6 mEq/L (ref 3.5–5.1)
Sodium: 137 mEq/L (ref 135–145)
Total Bilirubin: 0.3 mg/dL (ref 0.2–1.2)
Total Protein: 6.1 g/dL (ref 6.0–8.3)

## 2020-09-29 LAB — CBC WITH DIFFERENTIAL/PLATELET
Basophils Absolute: 0 10*3/uL (ref 0.0–0.1)
Basophils Relative: 0.8 % (ref 0.0–3.0)
Eosinophils Absolute: 0.1 10*3/uL (ref 0.0–0.7)
Eosinophils Relative: 1.8 % (ref 0.0–5.0)
HCT: 36 % (ref 36.0–46.0)
Hemoglobin: 12 g/dL (ref 12.0–15.0)
Lymphocytes Relative: 36.6 % (ref 12.0–46.0)
Lymphs Abs: 2.1 10*3/uL (ref 0.7–4.0)
MCHC: 33.3 g/dL (ref 30.0–36.0)
MCV: 103.5 fl — ABNORMAL HIGH (ref 78.0–100.0)
Monocytes Absolute: 0.5 10*3/uL (ref 0.1–1.0)
Monocytes Relative: 8.8 % (ref 3.0–12.0)
Neutro Abs: 3 10*3/uL (ref 1.4–7.7)
Neutrophils Relative %: 52 % (ref 43.0–77.0)
Platelets: 133 10*3/uL — ABNORMAL LOW (ref 150.0–400.0)
RBC: 3.48 Mil/uL — ABNORMAL LOW (ref 3.87–5.11)
RDW: 12.6 % (ref 11.5–15.5)
WBC: 5.9 10*3/uL (ref 4.0–10.5)

## 2020-09-29 LAB — T4, FREE: Free T4: 0.6 ng/dL (ref 0.60–1.60)

## 2020-09-29 LAB — IBC + FERRITIN
Ferritin: 198.4 ng/mL (ref 10.0–291.0)
Iron: 195 ug/dL — ABNORMAL HIGH (ref 42–145)
Saturation Ratios: 90.4 % — ABNORMAL HIGH (ref 20.0–50.0)
Transferrin: 154 mg/dL — ABNORMAL LOW (ref 212.0–360.0)

## 2020-09-29 LAB — HEMOGLOBIN A1C: Hgb A1c MFr Bld: 5.6 % (ref 4.6–6.5)

## 2020-09-29 LAB — TSH: TSH: 2.16 u[IU]/mL (ref 0.35–5.50)

## 2020-10-18 ENCOUNTER — Encounter: Payer: Self-pay | Admitting: Family Medicine

## 2020-11-02 ENCOUNTER — Ambulatory Visit (INDEPENDENT_AMBULATORY_CARE_PROVIDER_SITE_OTHER): Payer: Medicare PPO

## 2020-11-02 DIAGNOSIS — Z1211 Encounter for screening for malignant neoplasm of colon: Secondary | ICD-10-CM | POA: Diagnosis not present

## 2020-11-02 DIAGNOSIS — Z0000 Encounter for general adult medical examination without abnormal findings: Secondary | ICD-10-CM | POA: Diagnosis not present

## 2020-11-02 DIAGNOSIS — Z1231 Encounter for screening mammogram for malignant neoplasm of breast: Secondary | ICD-10-CM

## 2020-11-02 NOTE — Patient Instructions (Addendum)
Jennifer [name] , Thank you for taking time to come for your Medicare Wellness Visit. I appreciate your ongoing commitment to your health goals. Please review the following plan we discussed and let me know if I can assist you in the future.   Screening recommendations/referrals: Colonoscopy: Order placed 11/02/20  Mammogram: Order placed 11/02/20 please contact [phone] to make appt at your convience Recommended yearly ophthalmology/optometry visit for glaucoma screening and checkup Recommended yearly dental visit for hygiene and checkup  Vaccinations: Influenza vaccine: Due Pneumococcal vaccine: Due Tdap vaccine: Due Shingles vaccine: Shingrix discussed. Please contact your pharmacy for coverage information.   Covid-19: Completed 8/18, 11/26/19 & 06/15/20  Advanced directives: Please bring a copy of your health care power of attorney and living will to the office at your convenience.  Conditions/risks identified: exercise more   Next appointment: Follow up in one year for your annual wellness visit.   Preventive Care 40-64 Years, Female Preventive care refers to lifestyle choices and visits with your health care provider that can promote health and wellness. What does preventive care include? A yearly physical exam. This is also called an annual well check. Dental exams once or twice a year. Routine eye exams. Ask your health care provider how often you should have your eyes checked. Personal lifestyle choices, including: Daily care of your teeth and gums. Regular physical activity. Eating a healthy diet. Avoiding tobacco and drug use. Limiting alcohol use. Practicing safe sex. Taking low-dose aspirin daily starting at age 31. Taking vitamin and mineral supplements as recommended by your health care provider. What happens during an annual well check? The services and screenings done by your health care provider during your annual well check will depend on your age, overall health,  lifestyle risk factors, and family history of disease. Counseling  Your health care provider may ask you questions about your: Alcohol use. Tobacco use. Drug use. Emotional well-being. Home and relationship well-being. Sexual activity. Eating habits. Work and work Statistician. Method of birth control. Menstrual cycle. Pregnancy history. Screening  You may have the following tests or measurements: Height, weight, and BMI. Blood pressure. Lipid and cholesterol levels. These may be checked every 5 years, or more frequently if you are over 43 years old. Skin check. Lung cancer screening. You may have this screening every year starting at age 22 if you have a 30-pack-year history of smoking and currently smoke or have quit within the past 15 years. Fecal occult blood test (FOBT) of the stool. You may have this test every year starting at age 21. Flexible sigmoidoscopy or colonoscopy. You may have a sigmoidoscopy every 5 years or a colonoscopy every 10 years starting at age 46. Hepatitis C blood test. Hepatitis B blood test. Sexually transmitted disease (STD) testing. Diabetes screening. This is done by checking your blood sugar (glucose) after you have not eaten for a while (fasting). You may have this done every 1-3 years. Mammogram. This may be done every 1-2 years. Talk to your health care provider about when you should start having regular mammograms. This may depend on whether you have a family history of breast cancer. BRCA-related cancer screening. This may be done if you have a family history of breast, ovarian, tubal, or peritoneal cancers. Pelvic exam and Pap test. This may be done every 3 years starting at age 58. Starting at age 66, this may be done every 5 years if you have a Pap test in combination with an HPV test. Bone density scan. This  is done to screen for osteoporosis. You may have this scan if you are at high risk for osteoporosis. Discuss your test results, treatment  options, and if necessary, the need for more tests with your health care provider. Vaccines  Your health care provider may recommend certain vaccines, such as: Influenza vaccine. This is recommended every year. Tetanus, diphtheria, and acellular pertussis (Tdap, Td) vaccine. You may need a Td booster every 10 years. Zoster vaccine. You may need this after age 40. Pneumococcal 13-valent conjugate (PCV13) vaccine. You may need this if you have certain conditions and were not previously vaccinated. Pneumococcal polysaccharide (PPSV23) vaccine. You may need one or two doses if you smoke cigarettes or if you have certain conditions. Talk to your health care provider about which screenings and vaccines you need and how often you need them. This information is not intended to replace advice given to you by your health care provider. Make sure you discuss any questions you have with your health care provider. Document Released: 03/18/2015 Document Revised: 11/09/2015 Document Reviewed: 12/21/2014 Elsevier Interactive Patient Education  2017 [place] Prevention in the Home Falls can cause injuries. They can happen to people of all ages. There are many things you can do to make your home safe and to help prevent falls. What can I do on the outside of my home? Regularly fix the edges of walkways and driveways and fix any cracks. Remove anything that might make you trip as you walk through a door, such as a raised step or threshold. Trim any bushes or trees on the path to your home. Use bright outdoor lighting. Clear any walking paths of anything that might make someone trip, such as rocks or tools. Regularly check to see if handrails are loose or broken. Make sure that both sides of any steps have handrails. Any raised decks and porches should have guardrails on the edges. Have any leaves, snow, or ice cleared regularly. Use sand or salt on walking paths during winter. Clean up any  spills in your garage right away. This includes oil or grease spills. What can I do in the bathroom? Use night lights. Install grab bars by the toilet and in the tub and shower. Do not use towel bars as grab bars. Use non-skid mats or decals in the tub or shower. If you need to sit down in the shower, use a plastic, non-slip stool. Keep the floor dry. Clean up any water that spills on the floor as soon as it happens. Remove soap buildup in the tub or shower regularly. Attach bath mats securely with double-sided non-slip rug tape. Do not have throw rugs and other things on the floor that can make you trip. What can I do in the bedroom? Use night lights. Make sure that you have a light by your bed that is easy to reach. Do not use any sheets or blankets that are too big for your bed. They should not hang down onto the floor. Have a firm chair that has side arms. You can use this for support while you get dressed. Do not have throw rugs and other things on the floor that can make you trip. What can I do in the kitchen? Clean up any spills right away. Avoid walking on wet floors. Keep items that you use a lot in easy-to-reach places. If you need to reach something above you, use a strong step stool that has a grab bar. Keep electrical cords out  of the way. Do not use floor polish or wax that makes floors slippery. If you must use wax, use non-skid floor wax. Do not have throw rugs and other things on the floor that can make you trip. What can I do with my stairs? Do not leave any items on the stairs. Make sure that there are handrails on both sides of the stairs and use them. Fix handrails that are broken or loose. Make sure that handrails are as long as the stairways. Check any carpeting to make sure that it is firmly attached to the stairs. Fix any carpet that is loose or worn. Avoid having throw rugs at the top or bottom of the stairs. If you do have throw rugs, attach them to the floor  with carpet tape. Make sure that you have a light switch at the top of the stairs and the bottom of the stairs. If you do not have them, ask someone to add them for you. What else can I do to help prevent falls? Wear shoes that: Do not have high heels. Have rubber bottoms. Are comfortable and fit you well. Are closed at the toe. Do not wear sandals. If you use a stepladder: Make sure that it is fully opened. Do not climb a closed stepladder. Make sure that both sides of the stepladder are locked into place. Ask someone to hold it for you, if possible. Clearly mark and make sure that you can see: Any grab bars or handrails. First and last steps. Where the edge of each step is. Use tools that help you move around (mobility aids) if they are needed. These include: Canes. Walkers. Scooters. Crutches. Turn on the lights when you go into a dark area. Replace any light bulbs as soon as they burn out. Set up your furniture so you have a clear path. Avoid moving your furniture around. If any of your floors are uneven, fix them. If there are any pets around you, be aware of where they are. Review your medicines with your doctor. Some medicines can make you feel dizzy. This can increase your chance of falling. Ask your doctor what other things that you can do to help prevent falls. This information is not intended to replace advice given to you by your health care provider. Make sure you discuss any questions you have with your health care provider. Document Released: 12/16/2008 Document Revised: 07/28/2015 Document Reviewed: 03/26/2014 Elsevier Interactive Patient Education  2017 [employer].

## 2020-11-02 NOTE — Progress Notes (Signed)
Virtual Visit via Telephone Note  I connected with  Jennifer [name] on 11/02/20 at  1:00 PM EDT by telephone and verified that I am speaking with the correct person using two identifiers.  Location: Patient: Home Provider: Office Persons participating in the virtual visit: patient/Nurse Health Advisor   I discussed the limitations, risks, security and privacy concerns of performing an evaluation and management service by telephone and the availability of in person appointments. The patient expressed understanding and agreed to proceed.  Interactive audio and video telecommunications were attempted between this nurse and patient, however failed, due to patient having technical difficulties OR patient did not have access to video capability.  We continued and completed visit with audio only.  Some vital signs may be absent or patient reported.   Marzella Schlein, LPN   Subjective:   Jennifer [name] is a 55 y.o. female who presents for an Initial Medicare Annual Wellness Visit.  Review of Systems     Cardiac Risk Factors include: Other (see comment) (low blood pressure and fibromyalgia stated)     Objective:    Today's Vitals   11/02/20 1309  PainSc: 9    There is no height or weight on file to calculate BMI.  Advanced Directives 11/02/2020 09/02/2018 11/25/2015 11/24/2015 08/11/2013 10/17/2012 10/19/2010  Does Patient Have a Medical Advance Directive? Yes No Yes No;Yes Patient has advance directive, copy not in chart Patient has advance directive, copy not in chart Patient has advance directive, copy in chart  Type of Advance Directive Healthcare Power of [place];Living will - Living will Healthcare Power of [place];Living will Healthcare Power of [place];Living will Healthcare Power of Attorney Living will  Does patient want to make changes to medical advance directive? - - No - Patient declined - No change requested - -  Copy of Healthcare Power of Attorney in Chart? No - copy  requested - No - copy requested No - copy requested Copy requested from family Copy requested from family -  Pre-existing out of facility DNR order (yellow form or pink MOST form) - - - - No No -  Some encounter information is confidential and restricted. Go to Review Flowsheets activity to see all data.    Current Medications (verified) Outpatient Encounter Medications as of 11/02/2020  Medication Sig   acetaminophen (TYLENOL) 500 MG tablet Take 1,000 mg by mouth every 6 (six) hours as needed for moderate pain.   citalopram (CELEXA) 10 MG tablet Take 10 mg by mouth daily.   clonazePAM (KLONOPIN) 1 MG tablet Take 1 mg by mouth every 4 (four) hours as needed for anxiety. Can take up to 6   cyclobenzaprine (FLEXERIL) 10 MG tablet Take 10 mg by mouth at bedtime.   desvenlafaxine (PRISTIQ) 50 MG 24 hr tablet Take 50 mg by mouth daily.    diphenhydrAMINE (BENADRYL) 25 MG tablet Take 50 mg by mouth every 6 (six) hours as needed for itching or allergies (hives from Enbrel injection site).   ENBREL MINI 50 MG/ML SOCT Inject 15 mLs into the muscle once a week.   ondansetron (ZOFRAN-ODT) 8 MG disintegrating tablet DISSOLVE 1 TABLET(8 MG) INSIDE CHEEK DAILY AS NEEDED FOR NAUSEA OR VOMITING   propranolol (INDERAL) 20 MG tablet TAKE 1 TABLET BY MOUTH TWICE DAILY AS NEEDED FOR INCREASE HEART RATE (Patient taking differently: 10 mg. Takes TID and -  prn)   topiramate (TOPAMAX) 100 MG tablet Take 200 mg by mouth at bedtime.    No facility-administered encounter medications on  file as of 11/02/2020.    Allergies (verified) Florinef [fludrocortisone], Leflunomide, Methotrexate derivatives, Reglan [metoclopramide], Remicade [infliximab], Morphine and related, and Sulfonamide derivatives   History: Past Medical History:  Diagnosis Date   Anxiety    Arthritis    inactive RA- no meds   ASD (atrial septal defect)    Closure    Depression    Dysrhythmia    PVCs   Headache(784.0)    migraines    Hemochromatosis    IBS (irritable bowel syndrome)    Orthostatic syncope    POTS (postural orthostatic tachycardia syndrome)    Rheumatoid arthritis(714.0) 02/04/2012   Past Surgical History:  Procedure Laterality Date   CHOLECYSTECTOMY  1989   DILATION AND CURETTAGE OF UTERUS  2004   LAPAROSCOPIC TUBAL LIGATION  10/26/2010   Procedure: LAPAROSCOPIC TUBAL LIGATION;  Surgeon: Zelphia Cairo;  Location: [facility];  Service: Gynecology;  Laterality: Bilateral;   LAPAROSCOPY     PATENT FORAMEN OVALE CLOSURE  2008   Family History  Problem Relation Age of Onset   Hemochromatosis Mother    Hemochromatosis Sister    Social History   Socioeconomic History   Marital status: Divorced    Spouse name: Not on file   Number of children: Not on file   Years of education: Not on file   Highest education level: Not on file  Occupational History   Not on file  Tobacco Use   Smoking status: Never   Smokeless tobacco: Never  Vaping Use   Vaping Use: Never used  Substance and Sexual Activity   Alcohol use: Yes    Comment: occasionally.   Drug use: No   Sexual activity: Not on file  Other Topics Concern   Not on file  Social History Narrative   Not on file   Social Determinants of Health   Financial Resource Strain: Medium Risk   Difficulty of Paying Living Expenses: Somewhat hard  Food Insecurity: No Food Insecurity   Worried About Running Out of Food in the Last Year: Never true   Ran Out of Food in the Last Year: Never true  Transportation Needs: No Transportation Needs   Lack of Transportation (Medical): No   Lack of Transportation (Non-Medical): No  Physical Activity: Insufficiently Active   Days of Exercise per Week: 2 days   Minutes of Exercise per Session: 30 min  Stress: Stress Concern Present   Feeling of Stress : Rather much  Social Connections: Moderately Isolated   Frequency of Communication with Friends and Family: More than three times a week   Frequency of Social  Gatherings with Friends and Family: Three times a week   Attends Religious Services: More than 4 times per year   Active Member of Clubs or Organizations: No   Attends Banker Meetings: Never   Marital Status: Divorced    Tobacco Counseling Counseling given: Not Answered   Clinical Intake:  Pre-visit preparation completed: Yes  Pain : 0-10 Pain Score: 9  Pain Type: Chronic pain Pain Location: Generalized Pain Descriptors / Indicators: Aching, Other (Comment) (nerve pain)     Nutritional Risks: None Diabetes: No  How often do you need to have someone help you when you read instructions, pamphlets, or other written materials from your doctor or pharmacy?: 1 - Never  Diabetic?No  Interpreter Needed?: No  Information entered by :: Lanier Ensign, LPN   Activities of Daily Living In your present state of health, do you have any difficulty performing the following  activities: 11/02/2020  Hearing? N  Vision? N  Difficulty concentrating or making decisions? N  Walking or climbing stairs? N  Dressing or bathing? N  Doing errands, shopping? N  Preparing Food and eating ? N  Using the Toilet? N  In the past six months, have you accidently leaked urine? N  Do you have problems with loss of bowel control? N  Managing your Medications? N  Managing your Finances? N  Housekeeping or managing your Housekeeping? N  Some recent data might be hidden    Patient Care Team: Deeann Saint, MD as PCP - General (Family Medicine) Levert Feinstein, MD as Consulting Physician (Oncology) Duke Salvia, MD as Consulting Physician (Cardiology)  Indicate any recent Medical Services you may have received from other than [facility] providers in the past year (date may be approximate).     Assessment:   This is a routine wellness examination for [facility].  Hearing/Vision screen Hearing Screening - Comments:: Pt denies any hearing Vision Screening - Comments:: Pt has no  vision coverage at this time  Dietary issues and exercise activities discussed: Current Exercise Habits: Home exercise routine, Type of exercise: Other - see comments (quebe and stationary bike), Time (Minutes): 30, Frequency (Times/Week): 3, Weekly Exercise (Minutes/Week): 90   Goals Addressed             This Visit's Progress    Patient Stated       Exercise more        Depression Screen PHQ 2/9 Scores 11/02/2020  PHQ - 2 Score 1  Some encounter information is confidential and restricted. Go to Review Flowsheets activity to see all data.    Fall Risk Fall Risk  11/02/2020  Falls in the past year? 1  Number falls in past yr: 1  Injury with Fall? 1  Comment hit head and back  Risk for fall due to : History of fall(s);Impaired balance/gait;Impaired mobility;Impaired vision  Follow up Falls prevention discussed  Some encounter information is confidential and restricted. Go to Review Flowsheets activity to see all data.    FALL RISK PREVENTION PERTAINING TO THE HOME:  Any stairs in or around the home? Yes  If so, are there any without handrails? No  Home free of loose throw rugs in walkways, pet beds, electrical cords, etc? Yes  Adequate lighting in your home to reduce risk of falls? Yes   ASSISTIVE DEVICES UTILIZED TO PREVENT FALLS:  Life alert? No  Use of a cane, walker or w/c? No  Grab bars in the bathroom? Yes  Shower chair or bench in shower? Yes  Elevated toilet seat or a handicapped toilet? Yes   TIMED UP AND GO:  Was the test performed? No .  Cognitive Function:     6CIT Screen 11/02/2020  What Year? 0 points  What month? 0 points  What time? 0 points  Count back from 20 0 points  Months in reverse 0 points  Repeat phrase 0 points  Total Score 0    Immunizations Immunization History  Administered Date(s) Administered   PFIZER Comirnaty(Gray Top)Covid-19 Tri-Sucrose Vaccine 10/21/2019, 11/26/2019, 06/15/2020   PPD Test 06/27/2020    TDAP  status: Due, Education has been provided regarding the importance of this vaccine. Advised may receive this vaccine at local pharmacy or Health Dept. Aware to provide a copy of the vaccination record if obtained from local pharmacy or Health Dept. Verbalized acceptance and understanding.  Flu Vaccine status: Due, Education has been provided regarding  the importance of this vaccine. Advised may receive this vaccine at local pharmacy or Health Dept. Aware to provide a copy of the vaccination record if obtained from local pharmacy or Health Dept. Verbalized acceptance and understanding.  Pneumococcal vaccine status: Due, Education has been provided regarding the importance of this vaccine. Advised may receive this vaccine at local pharmacy or Health Dept. Aware to provide a copy of the vaccination record if obtained from local pharmacy or Health Dept. Verbalized acceptance and understanding.  Covid-19 vaccine status: Completed vaccines  Qualifies for Shingles Vaccine? Yes   Zostavax completed No   Shingrix Completed?: No.    Education has been provided regarding the importance of this vaccine. Patient has been advised to call insurance company to determine out of pocket expense if they have not yet received this vaccine. Advised may also receive vaccine at local pharmacy or Health Dept. Verbalized acceptance and understanding.  Screening Tests Health Maintenance  Topic Date Due   Pneumococcal Vaccine 41-91 Years old (1 - PCV) Never done   Hepatitis C Screening  Never done   TETANUS/TDAP  Never done   Zoster Vaccines- Shingrix (1 of 2) Never done   PAP SMEAR-Modifier  Never done   COLONOSCOPY (Pts 45-26yrs Insurance coverage will need to be confirmed)  Never done   MAMMOGRAM  Never done   COVID-19 Vaccine (4 - Booster for Pfizer series) 09/14/2020   INFLUENZA VACCINE  10/03/2020   HIV Screening  Completed   HPV VACCINES  Aged Out    Health Maintenance  Health Maintenance Due  Topic Date Due    Pneumococcal Vaccine 57-55 Years old (1 - PCV) Never done   Hepatitis C Screening  Never done   TETANUS/TDAP  Never done   Zoster Vaccines- Shingrix (1 of 2) Never done   PAP SMEAR-Modifier  Never done   COLONOSCOPY (Pts 45-101yrs Insurance coverage will need to be confirmed)  Never done   MAMMOGRAM  Never done   COVID-19 Vaccine (4 - Booster for Pfizer series) 09/14/2020   INFLUENZA VACCINE  10/03/2020    Colorectal cancer screening: Referral to GI placed 11/02/20. Pt aware the office will call re: appt.  Mammogram status: Ordered 11/02/20. Pt provided with contact info and advised to call to schedule appt.       Additional Screening:  Hepatitis C Screening: does qualify;   Vision Screening: Recommended annual ophthalmology exams for early detection of glaucoma and other disorders of the eye. Is the patient up to date with their annual eye exam?  No  Who is the provider or what is the name of the office in which the patient attends annual eye exams? Pt needs insurance to follow up  If pt is not established with a provider, would they like to be referred to a provider to establish care? Yes .   Dental Screening: Recommended annual dental exams for proper oral hygiene  Community Resource Referral / Chronic Care Management: CRR required this visit?  No   CCM required this visit?  No      Plan:     I have personally reviewed and noted the following in the patient's chart:   Medical and social history Use of alcohol, tobacco or illicit drugs  Current medications and supplements including opioid prescriptions. Patient is not currently taking opioid prescriptions. Functional ability and status Nutritional status Physical activity Advanced directives List of other physicians Hospitalizations, surgeries, and ER visits in previous 12 months Vitals Screenings to include cognitive, depression, and  falls Referrals and appointments  In addition, I have reviewed and discussed  with patient certain preventive protocols, quality metrics, and best practice recommendations. A written personalized care plan for preventive services as well as general preventive health recommendations were provided to patient.     Marzella Schlein, LPN   [id number]   Nurse Notes: None

## 2020-12-15 ENCOUNTER — Ambulatory Visit: Payer: Medicare PPO | Admitting: Student

## 2020-12-15 ENCOUNTER — Ambulatory Visit (INDEPENDENT_AMBULATORY_CARE_PROVIDER_SITE_OTHER): Payer: Medicare PPO

## 2020-12-15 ENCOUNTER — Ambulatory Visit: Payer: Medicare PPO

## 2020-12-15 ENCOUNTER — Encounter: Payer: Self-pay | Admitting: Student

## 2020-12-15 ENCOUNTER — Other Ambulatory Visit: Payer: Self-pay

## 2020-12-15 VITALS — BP 95/75 | HR 72 | Ht 67.0 in | Wt 158.8 lb

## 2020-12-15 DIAGNOSIS — R110 Nausea: Secondary | ICD-10-CM | POA: Diagnosis not present

## 2020-12-15 DIAGNOSIS — G901 Familial dysautonomia [Riley-Day]: Secondary | ICD-10-CM

## 2020-12-15 DIAGNOSIS — F411 Generalized anxiety disorder: Secondary | ICD-10-CM | POA: Diagnosis not present

## 2020-12-15 DIAGNOSIS — I471 Supraventricular tachycardia: Secondary | ICD-10-CM | POA: Diagnosis not present

## 2020-12-15 DIAGNOSIS — I951 Orthostatic hypotension: Secondary | ICD-10-CM | POA: Diagnosis not present

## 2020-12-15 NOTE — Patient Instructions (Signed)
Medication Instructions:  Your physician recommends that you continue on your current medications as directed. Please refer to the Current Medication list given to you today.  *If you need a refill on your cardiac medications before your next appointment, please call your pharmacy*   Lab Work: None If you have labs (blood work) drawn today and your tests are completely normal, you will receive your results only by: MyChart Message (if you have MyChart) OR A paper copy in the mail If you have any lab test that is abnormal or we need to change your treatment, we will call you to review the results.   Testing/Procedures: Your physician has requested that you have an echocardiogram. Echocardiography is a painless test that uses sound waves to create images of your heart. It provides your doctor with information about the size and shape of your heart and how well your heart's chambers and valves are working. This procedure takes approximately one hour. There are no restrictions for this procedure.   Follow-Up: At [facility], you and your health needs are our priority.  As part of our continuing mission to provide you with exceptional heart care, we have created designated Provider Care Teams.  These Care Teams include your primary Cardiologist (physician) and Advanced Practice Providers (APPs -  Physician Assistants and Nurse Practitioners) who all work together to provide you with the care you need, when you need it.   Other Instructions  ZIO XT- Long Term Monitor Instructions  Your physician has requested you wear a ZIO patch monitor for 7 days.  This is a single patch monitor. [employer] supplies one patch monitor per enrollment. Additional stickers are not available. Please do not apply patch if you will be having a Nuclear Stress Test,  Echocardiogram, Cardiac CT, MRI, or Chest Xray during the period you would be wearing the  monitor. The patch cannot be worn during these tests. You  cannot remove and re-apply the  ZIO XT patch monitor.  Your ZIO patch monitor will be mailed 3 day USPS to your address on file. It may take 3-5 days  to receive your monitor after you have been enrolled.  Once you have received your monitor, please review the enclosed instructions. Your monitor  has already been registered assigning a specific monitor serial # to you.  Billing and Patient Assistance Program Information  We have supplied [employer] with any of your insurance information on file for billing purposes. [employer] offers a sliding scale Patient Assistance Program for patients that do not have  insurance, or whose insurance does not completely cover the cost of the ZIO monitor.  You must apply for the Patient Assistance Program to qualify for this discounted rate.  To apply, please call [employer] at [phone], select option 4, select option 2, ask to apply for  Patient Assistance Program. Meredeth Ide will ask your household income, and how many people  are in your household. They will quote your out-of-pocket cost based on that information.  [employer] will also be able to set up a 79-month, interest-free payment plan if needed.  Applying the monitor   Shave hair from upper left chest.  Hold abrader disc by orange tab. Rub abrader in 40 strokes over the upper left chest as  indicated in your monitor instructions.  Clean area with 4 enclosed alcohol pads. Let dry.  Apply patch as indicated in monitor instructions. Patch will be placed under collarbone on left  side of chest with arrow pointing upward.  Rub patch  adhesive wings for 2 minutes. Remove white label marked "1". Remove the white  label marked "2". Rub patch adhesive wings for 2 additional minutes.  While looking in a mirror, press and release button in center of patch. A small green light will  flash 3-4 times. This will be your only indicator that the monitor has been turned on.  Do not shower for the first 24 hours. You may  shower after the first 24 hours.  Press the button if you feel a symptom. You will hear a small click. Record Date, Time and  Symptom in the Patient Logbook.  When you are ready to remove the patch, follow instructions on the last 2 pages of Patient  Logbook. Stick patch monitor onto the last page of Patient Logbook.  Place Patient Logbook in the blue and white box. Use locking tab on box and tape box closed  securely. The blue and white box has prepaid postage on it. Please place it in the mailbox as  soon as possible. Your physician should have your test results approximately 7 days after the  monitor has been mailed back to [facility].  Call [facility] Customer Care at [phone] if you have questions regarding  your ZIO XT patch monitor. Call them immediately if you see an orange light blinking on your  monitor.  If your monitor falls off in less than 4 days, contact our Monitor department at [phone].  If your monitor becomes loose or falls off after 4 days call [employer] at [phone] for  suggestions on securing your monitor

## 2020-12-15 NOTE — Progress Notes (Unsigned)
7 day Zio XT monitor place on patient in office  Dr. [name] to read

## 2020-12-15 NOTE — Progress Notes (Signed)
PCP:  Deeann Saint, MD Primary Cardiologist: None Electrophysiologist: None   Jennifer [name] is a 55 y.o. female well known to Dr. [name] who is seen in followup for dysautonomia manifested by hypotension and tachy palpitations in the context of rheumatoid arthritis hemochromatosis and Ehlers-Danlos syndrome; also felt to have SVT (See Below)    Dr [name] had planned to have her seen by Dr. Grayce Sessions or Dr. Bascom Levels at [facility]   She also has primary and secondary anxiety and depression.   She has previously been treated with Florinef and ProAmatine the former she did not tolerate because of migraines the latter was discontinued following tilt table testing at [facility] where she had predominant tachycardia; at that juncture her blood pressures were better and low-dose beta blockers were attempted. She failed to tolerate these.   She is also been seen by Dr. Vilinda Blanks at [facility], most recently 12/21; his last note described not taking yohimbine, Strattera,  Sudafed, fludrocortisone.  She is on pyridostigmine With daily diarrhea add midodrine which aggravate migraines and make her itch; orthostatic measurements at that visit had a increase in pulse from 65--118 with standing and stable blood pressure 94--101.   She is pursuing recumbent exercise bicycle as well as yoga. This is a been somewhat better. She has significant fatigue. She has not tolerated Wellbutrin in the past.   She is doing better since last visit.She is doing self care and receiving mental support at [facility] in [place]    Recently she been experiencing frequent palpitations at night.    She stop exercising on [employer] due to lightheadedness sensation. She have been active doing yoga and riding the bicycle .   Her feeling fatigue is better now   Hydration is repleted: she notes she have been taking liquid IV, Gatorade and propel water Sodium is replete   She is maintaining medications as directed    TODAY  For  the past 8 weeks she feels her POTs has flared up significantly. She continues to drink 80 oz + of fluid a day and tat least 2 g of sodium. She is up frequently in the night to urinate.  BP never higher than the 90s. Her new arm BP cuff has read as low as 58/45.  She has not had frank syncope in "several months".  She called [facility] for these symptoms 11/2020 as well and was recommended Desmopressin nasal spray. She researched the side effects and does not want to risk the adverse effects for the small chance of mild symptom relief.   She wears compression garments as "compression leggings" that come up past her belly button.  LMP 12/2019 with possible spotting 04/2020, so perimenopause on-going as well.   She feels her balance is terrible.    HRs up to 140-150s as she gets into the evening hours. Takes 10 mg propranolol so her palpitations and HR don't keep her awake.   In the morning she feels completely empty after the meds have warn and she is been urinating all night.      DATE TEST EF    08/17 Echo  55-60%      Past Medical History:  Diagnosis Date   Anxiety    Arthritis    inactive RA- no meds   ASD (atrial septal defect)    Closure    Depression    Dysrhythmia    PVCs   Headache(784.0)    migraines   Hemochromatosis    IBS (irritable bowel  syndrome)    Orthostatic syncope    POTS (postural orthostatic tachycardia syndrome)    Rheumatoid arthritis(714.0) 02/04/2012   Past Surgical History:  Procedure Laterality Date   CHOLECYSTECTOMY  1989   DILATION AND CURETTAGE OF UTERUS  2004   LAPAROSCOPIC TUBAL LIGATION  10/26/2010   Procedure: LAPAROSCOPIC TUBAL LIGATION;  Surgeon: Zelphia Cairo;  Location: [facility];  Service: Gynecology;  Laterality: Bilateral;   LAPAROSCOPY     PATENT FORAMEN OVALE CLOSURE  2008    Current Outpatient Medications  Medication Sig Dispense Refill   acetaminophen (TYLENOL) 500 MG tablet Take 1,000 mg by mouth every 6 (six) hours as needed for  moderate pain.     citalopram (CELEXA) 10 MG tablet Take 10 mg by mouth daily.     clonazePAM (KLONOPIN) 1 MG tablet Take 1 mg by mouth every 4 (four) hours as needed for anxiety. Can take up to 6     cyclobenzaprine (FLEXERIL) 10 MG tablet Take 10 mg by mouth at bedtime.     desvenlafaxine (PRISTIQ) 50 MG 24 hr tablet Take 50 mg by mouth daily.      diphenhydrAMINE (BENADRYL) 25 MG tablet Take 50 mg by mouth every 6 (six) hours as needed for itching or allergies (hives from Enbrel injection site).     ENBREL MINI 50 MG/ML SOCT Inject 15 mLs into the muscle once a week.     ondansetron (ZOFRAN-ODT) 8 MG disintegrating tablet DISSOLVE 1 TABLET(8 MG) INSIDE CHEEK DAILY AS NEEDED FOR NAUSEA OR VOMITING 30 tablet 2   propranolol (INDERAL) 20 MG tablet TAKE 1 TABLET BY MOUTH TWICE DAILY AS NEEDED FOR INCREASE HEART RATE (Patient taking differently: 10 mg. Takes TID and 10mg -20mg  prn) 180 tablet 2   topiramate (TOPAMAX) 100 MG tablet Take 200 mg by mouth at bedtime.   4   No current facility-administered medications for this visit.    Allergies  Allergen Reactions   Florinef [Fludrocortisone] Other (See Comments)    Severe migraine   Leflunomide Diarrhea   Methotrexate Derivatives Hives   Reglan [Metoclopramide]     Jerky    Remicade [Infliximab] Hives, Diarrhea and Other (See Comments)    Increase heart rate,decrease blood pressure   Morphine And Related Hives and Rash    Rxn to IV morphine; has never had PO morphine   Sulfonamide Derivatives Rash    Social History   Socioeconomic History   Marital status: Divorced    Spouse name: Not on file   Number of children: Not on file   Years of education: Not on file   Highest education level: Not on file  Occupational History   Not on file  Tobacco Use   Smoking status: Never   Smokeless tobacco: Never  Vaping Use   Vaping Use: Never used  Substance and Sexual Activity   Alcohol use: Yes    Comment: occasionally.   Drug use: No    Sexual activity: Not on file  Other Topics Concern   Not on file  Social History Narrative   Not on file   Social Determinants of Health   Financial Resource Strain: Medium Risk   Difficulty of Paying Living Expenses: Somewhat hard  Food Insecurity: No Food Insecurity   Worried About Running Out of Food in the Last Year: Never true   Ran Out of Food in the Last Year: Never true  Transportation Needs: No Transportation Needs   Lack of Transportation (Medical): No   Lack of Transportation (Non-Medical):  No  Physical Activity: Insufficiently Active   Days of Exercise per Week: 2 days   Minutes of Exercise per Session: 30 min  Stress: Stress Concern Present   Feeling of Stress : Rather much  Social Connections: Moderately Isolated   Frequency of Communication with Friends and Family: More than three times a week   Frequency of Social Gatherings with Friends and Family: Three times a week   Attends Religious Services: More than 4 times per year   Active Member of Clubs or Organizations: No   Attends Banker Meetings: Never   Marital Status: Divorced  Catering manager Violence: Not At Risk   Fear of Current or Ex-Partner: No   Emotionally Abused: No   Physically Abused: No   Sexually Abused: No     Review of Systems: All other systems reviewed and are otherwise negative except as noted above.  Physical Exam: Vitals:   12/15/20 1013  BP: 95/75  Pulse: 72  SpO2: 99%  Weight: 158 lb 12.8 oz (72 kg)  Height: 5\' 7"  (1.702 m)   Orthostatic VS for the past 24 hrs (Last 3 readings):  BP- Lying Pulse- Lying BP- Sitting Pulse- Sitting BP- Standing at 0 minutes Pulse- Standing at 0 minutes BP- Standing at 3 minutes Pulse- Standing at 3 minutes  12/15/20 1027 96/68 75 95/75 80 (!) 84/58 119 (!) 88/58 113     GEN- The patient is well appearing, alert and oriented x 3 today.   HEENT: normocephalic, atraumatic; sclera clear, conjunctiva pink; hearing intact; oropharynx  clear; neck supple, no JVP Lymph- no cervical lymphadenopathy Lungs- Clear to ausculation bilaterally, normal work of breathing.  No wheezes, rales, rhonchi Heart- Regular rate and rhythm, no murmurs, rubs or gallops, PMI not laterally displaced GI- soft, non-tender, non-distended, bowel sounds present, no hepatosplenomegaly Extremities- no clubbing, cyanosis, or edema; DP/PT/radial pulses 2+ bilaterally MS- no significant deformity or atrophy Skin- warm and dry, no rash or lesion Psych- euthymic mood, full affect Neuro- strength and sensation are intact  EKG is ordered. Personal review of EKG from today shows 72 bpm, NSR, normal intervals  Additional studies reviewed include: Previous EP office notes., [facility] Notes,   Assessment and Plan:  Assessment and  Plan   Dysautonomia   Rheumatoid arthritis   Depression-primary and secondary   Hemochromatosis-homozygous   Arachnodactyly  Peri-menopause - LMP Oct 2021   Orthostatics with tachycardia but no BP drop.  Following with a counselor for her mental health.    Physical activity is very limited right now due to her exacerbated symptoms.    Continues to have nocturnal palpitations for which propranolol provides some relief.    Unable to tolerate most of the vasoactive drugs.  She is continuing with sodium and fluid repletion with liquid IV.   Will update echo and 7 day monitor to assess her tachy-arryhtmias and make sure there is nothing we can target. The possibility of cardiomyopathy due to her ongoing intermittent tachycardia is of great concern to her.   She understands that neither test is likely to reveal treatment options, but our hope is that we can provide re-assurance and clarity.  Nov 2021, PA-C  12/15/20 8:49 AM

## 2020-12-23 ENCOUNTER — Ambulatory Visit (HOSPITAL_COMMUNITY): Payer: Medicare PPO

## 2020-12-28 ENCOUNTER — Ambulatory Visit: Payer: Medicare PPO | Admitting: Internal Medicine

## 2020-12-28 DIAGNOSIS — I471 Supraventricular tachycardia: Secondary | ICD-10-CM | POA: Diagnosis not present

## 2021-01-02 ENCOUNTER — Other Ambulatory Visit: Payer: Self-pay

## 2021-01-02 ENCOUNTER — Ambulatory Visit (HOSPITAL_COMMUNITY): Payer: Medicare PPO | Attending: Internal Medicine

## 2021-01-02 DIAGNOSIS — I471 Supraventricular tachycardia: Secondary | ICD-10-CM | POA: Diagnosis not present

## 2021-01-02 LAB — ECHOCARDIOGRAM COMPLETE
Area-P 1/2: 3.42 cm2
S' Lateral: 2.4 cm

## 2021-01-05 ENCOUNTER — Telehealth (INDEPENDENT_AMBULATORY_CARE_PROVIDER_SITE_OTHER): Payer: Medicare PPO | Admitting: Internal Medicine

## 2021-01-05 VITALS — BP 90/55 | HR 132 | Ht 67.0 in | Wt 156.0 lb

## 2021-01-05 DIAGNOSIS — I471 Supraventricular tachycardia: Secondary | ICD-10-CM | POA: Diagnosis not present

## 2021-01-05 DIAGNOSIS — G90A Postural orthostatic tachycardia syndrome (POTS): Secondary | ICD-10-CM | POA: Diagnosis not present

## 2021-01-05 DIAGNOSIS — F3289 Other specified depressive episodes: Secondary | ICD-10-CM

## 2021-01-05 DIAGNOSIS — G901 Familial dysautonomia [Riley-Day]: Secondary | ICD-10-CM

## 2021-01-05 DIAGNOSIS — Q8740 Marfan's syndrome, unspecified: Secondary | ICD-10-CM

## 2021-01-05 DIAGNOSIS — Q7962 Hypermobile Ehlers-Danlos syndrome: Secondary | ICD-10-CM | POA: Diagnosis not present

## 2021-01-05 DIAGNOSIS — I959 Hypotension, unspecified: Secondary | ICD-10-CM

## 2021-01-05 DIAGNOSIS — F329 Major depressive disorder, single episode, unspecified: Secondary | ICD-10-CM | POA: Diagnosis not present

## 2021-01-05 DIAGNOSIS — M069 Rheumatoid arthritis, unspecified: Secondary | ICD-10-CM | POA: Diagnosis not present

## 2021-01-05 NOTE — Progress Notes (Signed)
Electrophysiology TeleHealth Note   Due to national recommendations of social distancing due to COVID 19, an audio/video telehealth visit is felt to be most appropriate for this patient at this time.  See MyChart message from today for the patient's consent to telehealth for [facility].   Date:  01/06/2021   ID:  Jennifer [name], DOB 02/07/1966, MRN [id number]  Location: patient's home  Provider location: [address] [place]  Evaluation Performed: Follow-up visit  PCP:  Billie Ruddy, MD  Cardiologist:    Electrophysiologist:  SK   Chief Complaint:  Low BP and dysautonomia  History of Present Illness:    Jennifer [name] is a 55 y.o. female who presents via audio/video conferencing for a telehealth visit today.  Since last being seen in our clinic  the patient reports having continued to struggle with high HR and low BP.    They worsened this summer and then significantly following the death of her father in November 28, 2022- devestating emotionally and she is "stoic" and BP and HR have been poorly tolerating   Has spent most of her time in bed or chair    She was seen   [facility] 12/2 with multiple complaints and intolerances to multiple med outlined, significant fatigue *and she recounts "no more options" More pain 2/2 rheumatoid  Nauseated w nausea --decreased appetite and decreased PO intake with decreased fluids  Wearing a compression garment feet to abdomen  Seen 10/22 while I was away ([place]) with complaints of blood pressures in the 50s-80s and heart rates up to the 150s.  Orthostatics in the office that day pulse 75--119 and blood pressure 96--84; symptom events were associated with PACs the overall burden of which was about 5.9% and were evenly distributed over the 24-hour period.   Describing nocturnal palpitations, interestingly, she awakens at night her heart rates are 80 or so she stands up and her heart rates are 130.  She frequently ends up waking at night  to urinate.  Her blood pressures have been gradually increasing.  Once in the past but another time not in the past a pause of her Enbrel and a resumption of it were temporally associated with profound hypotension.  Previous medications used:  Midodrine-y SE Florinef-y  SE Droxydopa-y SE  Mestinon-y SE Beta blockers-y Ivabradine-y Serotonin drugs-y Anticholinergics-y IV fluids -y Compression- y    The patient denies symptoms of fevers, chills, cough, or new SOB worrisome for COVID 19.    Past Medical History:  Diagnosis Date   Anxiety    Arthritis    inactive RA- no meds   ASD (atrial septal defect)    Closure    Depression    Dysrhythmia    PVCs   Headache(784.0)    migraines   Hemochromatosis    IBS (irritable bowel syndrome)    Orthostatic syncope    POTS (postural orthostatic tachycardia syndrome)    Rheumatoid arthritis(714.0) 02/04/2012    Past Surgical History:  Procedure Laterality Date   CHOLECYSTECTOMY  1989   DILATION AND CURETTAGE OF UTERUS  2004   LAPAROSCOPIC TUBAL LIGATION  10/26/2010   Procedure: LAPAROSCOPIC TUBAL LIGATION;  Surgeon: Marylynn Pearson;  Location: [facility];  Service: Gynecology;  Laterality: Bilateral;   LAPAROSCOPY     PATENT FORAMEN OVALE CLOSURE  2008    Current Outpatient Medications  Medication Sig Dispense Refill   acetaminophen (TYLENOL) 500 MG tablet Take 1,000 mg by mouth every 6 (six) hours as  needed for moderate pain.     citalopram (CELEXA) 10 MG tablet Take 10 mg by mouth daily.     clonazePAM (KLONOPIN) 1 MG tablet Take 1 mg by mouth every 4 (four) hours as needed for anxiety. Can take up to 6     cyclobenzaprine (FLEXERIL) 10 MG tablet Take 10 mg by mouth at bedtime.     desvenlafaxine (PRISTIQ) 50 MG 24 hr tablet Take 50 mg by mouth daily.      diphenhydrAMINE (BENADRYL) 25 MG tablet Take 50 mg by mouth every 6 (six) hours as needed for itching or allergies (hives from Enbrel injection site).     ENBREL MINI 50 MG/ML  SOCT Inject 15 mLs into the muscle once a week.     ondansetron (ZOFRAN-ODT) 8 MG disintegrating tablet DISSOLVE 1 TABLET(8 MG) INSIDE CHEEK DAILY AS NEEDED FOR NAUSEA OR VOMITING 30 tablet 2   propranolol (INDERAL) 20 MG tablet TAKE 1 TABLET BY MOUTH TWICE DAILY AS NEEDED FOR INCREASE HEART RATE (Patient taking differently: 10 mg. Takes TID and 10mg -20mg  prn) 180 tablet 2   topiramate (TOPAMAX) 100 MG tablet Take 200 mg by mouth at bedtime.   4   No current facility-administered medications for this visit.    Allergies:   Florinef [fludrocortisone], Leflunomide, Methotrexate derivatives, Reglan [metoclopramide], Remicade [infliximab], Morphine and related, and Sulfonamide derivatives     Exam:    Vital Signs:  BP (!) 90/55 Comment: recheck  Pulse (!) 132 Comment: recheck  Ht 5\' 7"  (1.702 m)   Wt 156 lb (70.8 kg)   BMI 24.43 kg/m         Labs/Other Tests and Data Reviewed:    Recent Labs: 09/28/2020: ALT 13; BUN 7; Creatinine, Ser 0.87; Hemoglobin 12.0; Platelets 133.0; Potassium 3.6; Sodium 137; TSH 2.16   Wt Readings from Last 3 Encounters:  01/05/21 156 lb (70.8 kg)  12/15/20 158 lb 12.8 oz (72 kg)  09/28/20 157 lb 6.4 oz (71.4 kg)     Other studies personally reviewed: Additional studies/ records that were reviewed today include: As above       ASSESSMENT & PLAN:     Dysautonomia with hypotension and tachycardia  Rheumatoid arthritis  Depression-primary and secondary  Arachnodactyly  SVT  Ehlers-Danlos 3 Joint hypermobility  Nocturnal tachycardia  Nocturnal tachycardia seems to be orthostatic.  We discussed sleep hygiene including decreasing fluid intake 3 or 4 hours before bedtime and then also the possibility of using amitriptyline to try to help her stay asleep through the night.  We will try very low-dose; it can rarely aggravate orthostatic intolerance but if it helps with through the night we will be far better off.  Continue salt and water  repletion.  I am not sure what else to do for her hypotension and that she has been intolerant of the many of the medications as outlined above.  I think she told me she has follow-up scheduled at [facility] and will see if they have any other insights      COVID 19 screen The patient denies symptoms of COVID 19 at this time.  The importance of social distancing was discussed today.  Follow-up:  40M  telehealth visit      Current medicines are reviewed at length with the patient today.   The patient does not have concerns regarding her medicines.  The following changes were made today:  ADD AMITRIPTYLINE 10 MG QHS   Labs/ tests ordered today include:  No orders of  the defined types were placed in this encounter.   Future tests ( post COVID )   in   months  Patient Risk:  after full review of this patients clinical status, I feel that they are at moderate risk at this time.  Today, I have spent 15  minutes with the patient with telehealth technology discussing the above.    Signed, Sherryl Manges, MD  01/06/2021 4:12 PM     [facility] [address] [place] [address] [phone] (office) [phone] (fax)

## 2021-01-05 NOTE — Patient Instructions (Signed)
Medication Instructions:  °Your physician recommends that you continue on your current medications as directed. Please refer to the Current Medication list given to you today. ° ° °*If you need a refill on your cardiac medications before your next appointment, please call your pharmacy* ° ° °Lab Work: °None ordered. ° °If you have labs (blood work) drawn today and your tests are completely normal, you will receive your results only by: °MyChart Message (if you have MyChart) OR °A paper copy in the mail °If you have any lab test that is abnormal or we need to change your treatment, we will call you to review the results. ° ° °Testing/Procedures: °None ordered. ° ° ° °Follow-Up: °At [facility], you and your health needs are our priority.  As part of our continuing mission to provide you with exceptional heart care, we have created designated Provider Care Teams.  These Care Teams include your primary Cardiologist (physician) and Advanced Practice Providers (APPs -  Physician Assistants and Nurse Practitioners) who all work together to provide you with the care you need, when you need it. ° °We recommend signing up for the patient portal called "MyChart".  Sign up information is provided on this After Visit Summary.  MyChart is used to connect with patients for Virtual Visits (Telemedicine).  Patients are able to view lab/test results, encounter notes, upcoming appointments, etc.  Non-urgent messages can be sent to your provider as well.   °To learn more about what you can do with MyChart, go to [email or web address].   ° °Your next appointment:   °To be determined °

## 2021-01-09 MED ORDER — AMITRIPTYLINE HCL 10 MG PO TABS: 10.0000 mg | ORAL_TABLET | Freq: Every day | ORAL | 2 refills | Status: DC

## 2021-01-12 ENCOUNTER — Other Ambulatory Visit: Payer: Self-pay

## 2021-01-12 MED ORDER — ONDANSETRON 8 MG PO TBDP: ORAL_TABLET | ORAL | 5 refills | Status: DC

## 2021-01-12 NOTE — Telephone Encounter (Signed)
OK to refill

## 2021-01-12 NOTE — Telephone Encounter (Signed)
[facility] pharmacy is requesting a refill on ondansetron. Would Dr. [name] like to refill this medication? Please address

## 2021-02-20 ENCOUNTER — Encounter: Payer: Self-pay | Admitting: Internal Medicine

## 2021-02-21 MED ORDER — AMITRIPTYLINE HCL 25 MG PO TABS: 25.0000 mg | ORAL_TABLET | Freq: Every day | ORAL | 3 refills | Status: DC

## 2021-03-01 ENCOUNTER — Telehealth: Payer: Self-pay | Admitting: Family Medicine

## 2021-03-01 NOTE — Telephone Encounter (Signed)
Patient calling in with respiratory symptoms: Shortness of breath, chest pain, palpitations or other red words send to Triage  Does the patient have a fever over 100, cough, congestion, sore throat, runny nose, lost of taste/smell (please list symptoms that patient has)?cough, migraine, fatigue. Pt is immunocomprise   What date did symptoms start?03-01-2021 (If over 5 days ago, pt may be scheduled for in person visit)  Have you tested for Covid in the last 5 days? Yes   If yes, was it positive []  OR negative [] ? If positive in the last 5 days, please schedule virtual visit now. If negative, schedule for an in person OV with the next available provider if PCP has no openings. Please also let patient know they will be tested again (follow the script below)  "you will have to arrive prior to your appt time to be Covid tested. Please park in back of office at the cone & call [phone] to let the staff know you have arrived. A staff member will meet you at your car to do a rapid covid test. Once the test has resulted you will be notified by phone of your results to determine if appt will remain an in person visit or be converted to a virtual/phone visit. If you arrive less than before your appt time, your visit will be automatically converted to virtual & any recommended testing will happen AFTER the visit."  Pt has virtual with dr [phone] 03-02-2021 THINGS TO REMEMBER  If no availability for virtual visit in office,  please schedule another [place] office  If no availability at another [place] office, please instruct patient that they can schedule an evisit or virtual visit through their mychart account. Visits up to 8pm  patients can be seen in office 5 days after positive COVID test

## 2021-03-02 ENCOUNTER — Telehealth: Payer: Medicare PPO | Admitting: Family Medicine

## 2021-03-02 ENCOUNTER — Encounter: Payer: Self-pay | Admitting: Family Medicine

## 2021-03-02 VITALS — BP 87/50 | HR 155 | Wt 160.0 lb

## 2021-03-02 DIAGNOSIS — U071 COVID-19: Secondary | ICD-10-CM

## 2021-03-02 MED ORDER — PROMETHAZINE-DM 6.25-15 MG/5ML PO SYRP: 5.0000 mL | ORAL_SOLUTION | Freq: Four times a day (QID) | ORAL | 0 refills | Status: DC | PRN

## 2021-03-02 MED ORDER — PROMETHAZINE-DM 6.25-15 MG/5ML PO SYRP: 5.0000 mL | ORAL_SOLUTION | Freq: Two times a day (BID) | ORAL | 0 refills | Status: DC | PRN

## 2021-03-02 NOTE — Progress Notes (Addendum)
Virtual Visit via Video Note  I connected with Jennifer [name]  on 03/02/21 at  4:40 PM EST by a video enabled telemedicine application and verified that I am speaking with the correct person using two identifiers.  Location patient: [place] Location provider:work or home office Persons participating in the virtual visit: patient, provider  I discussed the limitations and requested verbal permission for telemedicine visit. The patient expressed understanding and agreed to proceed.   HPI:  Acute telemedicine visit for Covid19: -Onset: about 2 days ago; tested positive for covid -Symptoms include: nasal congestion, cough - some discomfort when coughs, headache -mom and niece also sick with covid and one of her sons was positive over christmas -Denies: fever, SOB, NVD -is drinking fluids -Pertinent past medical history: see below, has had covid before; reports has RA and POTS -Pertinent medication allergies: Allergies  Allergen Reactions   Florinef [Fludrocortisone] Other (See Comments)    Severe migraine   Leflunomide Diarrhea   Methotrexate Derivatives Hives   Reglan [Metoclopramide]     Jerky    Remicade [Infliximab] Hives, Diarrhea and Other (See Comments)    Increase heart rate,decrease blood pressure   Morphine And Related Hives and Rash    Rxn to IV morphine; has never had PO morphine   Sulfonamide Derivatives Rash  -COVID-19 vaccine status: 2 doses and a booster, has not had most recent booster Immunization History  Administered Date(s) Administered   PFIZER Comirnaty(Gray Top)Covid-19 Tri-Sucrose Vaccine 10/21/2019, 11/26/2019, 06/15/2020   PPD Test 06/27/2020     ROS: See pertinent positives and negatives per HPI.  Past Medical History:  Diagnosis Date   Anxiety    Arthritis    inactive RA- no meds   ASD (atrial septal defect)    Closure    Depression    Dysrhythmia    PVCs   Headache(784.0)    migraines   Hemochromatosis    IBS (irritable bowel syndrome)     Orthostatic syncope    POTS (postural orthostatic tachycardia syndrome)    Rheumatoid arthritis(714.0) 02/04/2012    Past Surgical History:  Procedure Laterality Date   CHOLECYSTECTOMY  1989   DILATION AND CURETTAGE OF UTERUS  2004   LAPAROSCOPIC TUBAL LIGATION  10/26/2010   Procedure: LAPAROSCOPIC TUBAL LIGATION;  Surgeon: Zelphia Cairo;  Location: [facility];  Service: Gynecology;  Laterality: Bilateral;   LAPAROSCOPY     PATENT FORAMEN OVALE CLOSURE  2008     Current Outpatient Medications:    acetaminophen (TYLENOL) 500 MG tablet, Take 1,000 mg by mouth every 6 (six) hours as needed for moderate pain., Disp: , Rfl:    amitriptyline (ELAVIL) 25 MG tablet, Take 1 tablet (25 mg total) by mouth at bedtime., Disp: 90 tablet, Rfl: 3   clonazePAM (KLONOPIN) 1 MG tablet, Take 1 mg by mouth every 4 (four) hours as needed for anxiety. Can take up to 6, Disp: , Rfl:    cyclobenzaprine (FLEXERIL) 10 MG tablet, Take 10 mg by mouth at bedtime., Disp: , Rfl:    desvenlafaxine (PRISTIQ) 50 MG 24 hr tablet, Take 50 mg by mouth daily. , Disp: , Rfl:    diphenhydrAMINE (BENADRYL) 25 MG tablet, Take 50 mg by mouth every 6 (six) hours as needed for itching or allergies (hives from Enbrel injection site)., Disp: , Rfl:    ENBREL MINI 50 MG/ML SOCT, Inject 15 mLs into the muscle once a week., Disp: , Rfl:    ondansetron (ZOFRAN-ODT) 8 MG disintegrating tablet, DISSOLVE 1 TABLET(8 MG) INSIDE  CHEEK DAILY AS NEEDED FOR NAUSEA OR VOMITING, Disp: 30 tablet, Rfl: 5   promethazine-dextromethorphan (PROMETHAZINE-DM) 6.25-15 MG/5ML syrup, Take 5 mLs by mouth 4 (four) times daily as needed for cough., Disp: 118 mL, Rfl: 0   propranolol (INDERAL) 20 MG tablet, TAKE 1 TABLET BY MOUTH TWICE DAILY AS NEEDED FOR INCREASE HEART RATE (Patient taking differently: 10 mg. Takes TID and 10mg -20mg  prn), Disp: 180 tablet, Rfl: 2   topiramate (TOPAMAX) 100 MG tablet, Take 200 mg by mouth at bedtime. , Disp: , Rfl: 4  EXAM:  VITALS  per patient if applicable: reports her BP and BP have been in her normal range - reports has POTS and sees cardiologist for this.   GENERAL: alert, oriented, appears well and in no acute distress  HEENT: atraumatic, conjunttiva clear, no obvious abnormalities on inspection of external nose and ears  NECK: normal movements of the head and neck  LUNGS: on inspection no signs of respiratory distress, breathing rate appears normal, no obvious gross SOB, gasping or wheezing  CV: no obvious cyanosis  MS: moves all visible extremities without noticeable abnormality  PSYCH/NEURO: pleasant and cooperative, no obvious depression or anxiety, speech and thought processing grossly intact  ASSESSMENT AND PLAN:  Discussed the following assessment and plan:  COVID-19   Discussed treatment options and risk of drug interactions, ideal treatment window, potential complications, isolation and precautions for COVID-19.  After lengthy discussion, the patient declined referral for Covid outpatient treatment at this time. The patient did want a prescription for cough, and preferred syrup - discussed risks/interactions with her meds. Not taking some of her psych meds and agrees to avoid benzo when taking the cough med. Other symptomatic care measures summarized in patient instructions. Advised to seek prompt virtual visit or in person care if worsening, new symptoms arise, or if is not improving with treatment as expected per our conversation of expected course. Discussed options for follow up care. I discussed the assessment and treatment plan with the patient. The patient was provided an opportunity to ask questions and all were answered. The patient agreed with the plan and demonstrated an understanding of the instructions.     , DO

## 2021-03-02 NOTE — Patient Instructions (Addendum)
HOME CARE TIPS:  -I sent the medication(s) we discussed to your pharmacy: Meds ordered this encounter  Medications   promethazine-dextromethorphan (PROMETHAZINE-DM) 6.25-15 MG/5ML syrup    Sig: Take 5 mLs by mouth 4 (four) times daily as needed for cough.    Dispense:  118 mL    Refill:  0   Be careful taking this medication with other sedating drugs and do not use if causes any side effects. Can cause drowsiness.   -can use tylenol if needed for fevers, aches and pains per instructions  -can use nasal saline a few times per day if you have nasal congestion  -stay hydrated, drink plenty of fluids and eat small healthy meals - avoid dairy  -can take 1000 IU ( ) Vit D3 and 100-500 mg of Vit C daily per instructions  -If the Covid test is positive, check out the [facility] website for more information on home care, transmission and treatment for COVID19  -follow up with your doctor in 2-3 days unless improving and feeling better  -stay home while sick, except to seek medical care. If you have COVID19, you will likely be contagious for 7-10 days. Flu or Influenza is likely contagious for about 7 days. Other respiratory viral infections remain contagious for 5-10+ days depending on the virus and many other factors. Wear a good mask that fits snugly (such as N95 or KN95) if around others to reduce the risk of transmission.  It was nice to meet you today, and I really hope you are feeling better soon. I help [facility] out with telemedicine visits on Tuesdays and Thursdays and am happy to help if you need a follow up virtual visit on those days. Otherwise, if you have any concerns or questions following this visit please schedule a follow up visit with your Primary Care doctor or seek care at a local urgent care clinic to avoid delays in care.    Seek in person care or schedule a follow up video visit promptly if your symptoms worsen, new concerns arise or you are not improving with treatment.  Call 911 and/or seek emergency care if your symptoms are severe or life threatening.    Promethazine; Dextromethorphan Oral Solution What is this medication? PROMETHAZINE; DEXTROMETHORPHAN (proe METH a zeen; dex troe meth OR fan) is a cough suppressant and an antihistamine. It is used to treat coughing due to colds or allergies. This medicine will not treat an infection. This medicine may be used for other purposes; ask your health care provider or pharmacist if you have questions. COMMON BRAND NAME(S): Phen Tuss DM What should I tell my care team before I take this medication? They need to know if you have any of the following conditions: blockage in your bowel diabetes eczema glaucoma heart disease liver disease low blood counts, like low white cell, platelet, or red cell counts lung or breathing disease, like asthma Parkinson's disease prostate disease seizures stomach or intestine problems trouble passing urine an unusual or allergic reaction to promethazine, dextromethorphan, other medicines, foods, dyes, or preservatives pregnant or trying to get pregnant breast-feeding How should I use this medication? Take this medicine by mouth with a glass of water. Follow the directions on the prescription label. Use a specially marked spoon or container to measure your medicine. Household spoons are not accurate. Take your doses at regular intervals. Do not take your medicine more often than directed. Talk to your pediatrician regarding the use of this medicine in children. Special care may be needed.  Do not use this medicine in children less than 56 years of age. Patients over 32 years old may have a stronger reaction and need a smaller dose. Overdosage: If you think you have taken too much of this medicine contact a poison control center or emergency room at once. NOTE: This medicine is only for you. Do not share this medicine with others. What if I miss a dose? If you miss a dose, take  it as soon as you can. If it is almost time for your next dose, take only that dose. Do not take double or extra doses. What may interact with this medication? Do not take this medicine with any of the following medications: MAOIs like Carbex, Eldepryl, Marplan, Nardil, and Parnate This medicine may also interact with the following medications: alcohol or alcohol-containing products antihistamines for allergy, cough, and cold atropine certain medicines for anxiety or sleep certain medicines for bladder problems like oxybutynin, tolterodine certain medicines for depression like amitriptyline, fluoxetine, sertraline certain medicines for Parkinson's disease like benztropine, trihexyphenidyl certain medicines for stomach problems like dicyclomine, hyoscyamine certain medicines for travel sickness like scopolamine epinephrine general anesthetics like halothane, isoflurane, methoxyflurane, propofol ipratropium medicines for depression, anxiety or psychotic disturbances medicines for high blood pressure medicines for seizures like phenobarbital, primidone, phenytoin medicines that relax muscles for surgery metoclopramide narcotic medicines for pain This list may not describe all possible interactions. Give your health care provider a list of all the medicines, herbs, non-prescription drugs, or dietary supplements you use. Also tell them if you smoke, drink alcohol, or use illegal drugs. Some items may interact with your medicine. What should I watch for while using this medication? Tell your doctor if your symptoms do not improve or if they get worse. You may get drowsy or dizzy. Do not drive, use machinery, or do anything that needs mental alertness until you know how this medicine affects you. Do not stand or sit up quickly, especially if you are an older patient. This reduces the risk of dizzy or fainting spells. Alcohol may interfere with the effect of this medicine. Avoid alcoholic  drinks. This medicine can make you more sensitive to the sun. Keep out of the sun. If you cannot avoid being in the sun, wear protective clothing and use sunscreen. Do not use sun lamps or tanning beds/booths. What side effects may I notice from receiving this medication? Side effects that you should report to your doctor or health care professional as soon as possible: allergic reactions like skin rash, itching or hives, swelling of the face, lips, or tongue changes in vision confusion fever, chills, sore throat restlessness seizures signs and symptoms of high blood sugar such as being more thirsty or hungry or having to urinate more than normal. You may also feel very tired or have blurry vision. signs and symptoms of liver injury like dark yellow or brown urine; general ill feeling or flu-like symptoms; light-colored stools; loss of appetite; nausea; right upper belly pain; unusually weak or tired; yellowing of the eyes or skin signs and symptoms of low blood pressure like dizziness; feeling faint or lightheaded, falls; unusually weak or tired trouble passing urine or change in the amount of urine trouble swallowing uncontrollable movements of the arms, face, head, mouth, neck, or upper body unusual bruising or bleeding unusually weak or tired Side effects that usually do not require medical attention (report to your doctor or health care professional if they continue or are bothersome): drowsiness dizziness dry mouth nausea upset  stomach This list may not describe all possible side effects. Call your doctor for medical advice about side effects. You may report side effects to FDA at 1-800-FDA-1088. Where should I keep my medication? Keep out of the reach of children. Store at room temperature between 20 and 25 degrees C (68 and 77 degrees F). Protect from light. Throw away any unused medicine after the expiration date. NOTE: This sheet is a summary. It may not cover all possible  information. If you have questions about this medicine, talk to your doctor, pharmacist, or health care provider.  2022 Elsevier/Gold Standard (2020-11-08 00:00:00)

## 2021-04-25 ENCOUNTER — Telehealth: Payer: Medicare Other | Admitting: Internal Medicine

## 2021-04-25 ENCOUNTER — Other Ambulatory Visit: Payer: Self-pay

## 2021-04-25 ENCOUNTER — Encounter: Payer: Self-pay | Admitting: Internal Medicine

## 2021-05-10 DIAGNOSIS — N760 Acute vaginitis: Secondary | ICD-10-CM | POA: Diagnosis not present

## 2021-05-10 DIAGNOSIS — R8279 Other abnormal findings on microbiological examination of urine: Secondary | ICD-10-CM | POA: Diagnosis not present

## 2021-05-10 DIAGNOSIS — R300 Dysuria: Secondary | ICD-10-CM | POA: Diagnosis not present

## 2021-06-15 DIAGNOSIS — Z79899 Other long term (current) drug therapy: Secondary | ICD-10-CM | POA: Diagnosis not present

## 2021-06-15 DIAGNOSIS — M797 Fibromyalgia: Secondary | ICD-10-CM | POA: Diagnosis not present

## 2021-06-15 DIAGNOSIS — Q796 Ehlers-Danlos syndrome, unspecified: Secondary | ICD-10-CM | POA: Diagnosis not present

## 2021-06-15 DIAGNOSIS — M0579 Rheumatoid arthritis with rheumatoid factor of multiple sites without organ or systems involvement: Secondary | ICD-10-CM | POA: Diagnosis not present

## 2021-06-16 ENCOUNTER — Encounter: Payer: Self-pay | Admitting: Internal Medicine

## 2021-06-16 ENCOUNTER — Ambulatory Visit (INDEPENDENT_AMBULATORY_CARE_PROVIDER_SITE_OTHER): Payer: Medicare Other | Admitting: Internal Medicine

## 2021-06-16 VITALS — BP 84/65 | HR 123 | Ht 67.0 in | Wt 165.0 lb

## 2021-06-16 DIAGNOSIS — G901 Familial dysautonomia [Riley-Day]: Secondary | ICD-10-CM | POA: Diagnosis not present

## 2021-06-16 DIAGNOSIS — I471 Supraventricular tachycardia: Secondary | ICD-10-CM

## 2021-06-16 MED ORDER — PROPRANOLOL HCL 20 MG PO TABS: ORAL_TABLET | ORAL | 2 refills | Status: DC

## 2021-06-16 NOTE — Progress Notes (Signed)
?  ? ?Electrophysiology TeleHealth Note ? ? ?Due to national recommendations of social distancing due to [place] 19, an audio/video telehealth visit is felt to be most appropriate for this patient at this time.  See MyChart message from today for the patient's consent to telehealth for [facility]. ? ? ?Date:  06/16/2021  ? ?ID:  Jennifer [name], DOB 10-03-1965, MRN [id number]  Location: patient's home ? ?Provider location: [address], [place] ? ?Evaluation Performed: Follow-up visit ? ?PCP:  Billie Ruddy, MD  ?Cardiologist:    ?Electrophysiologist:  SK  ? ?Chief Complaint:  Low BP and dysautonomia ? ?History of Present Illness:   ? ?Jennifer [name] is a 56 y.o. female who presents via [employer], civil (consulting) for a telehealth visit today.  Since last being seen in our clinic  the patient reports having continued to struggle with high HR and low BP.    They worsened this summer and then significantly following the death of her father in 2022-11-28- devestating emotionally and she is "stoic" and BP and HR have been poorly tolerating  ? ?Has spent most of her time in bed or chair ? ?  ?She was seen   [facility] 12/2 with multiple complaints and intolerances to multiple med outlined, significant fatigue *and she recounts "no more options" ?More pain 2/2 rheumatoid  ?Nauseated w nausea ?--decreased appetite and decreased PO intake with decreased fluids  ?Wearing a compression garment feet to abdomen ? ?Seen 10/22 while I was away ([place]) with complaints of blood pressures in the 50s-80s and heart rates up to the 150s.  Orthostatics in the office that day pulse 75--119 and blood pressure 96--84; symptom events were associated with PACs the overall burden of which was about 5.9% and were evenly distributed over the 24-hour period. ?  ?Describing nocturnal palpitations, interestingly, she awakens at night her heart rates are 80 or so she stands up and her heart rates are 130.  She frequently ends up waking at night  to urinate. ? ?Syncope 12/22 in the bathroom.   ? ?Using propranolol as needed for  ? ?Previous medications used: ? ?Midodrine-y SE ?Florinef-y  SE ?Droxydopa-y SE  ?Mestinon-y SE ?Beta blockers-y ?Ivabradine-y ?Serotonin drugs-y ?Anticholinergics-y ?IV fluids -y ?Compression- y ?  ?Still struggling with tachypalp during the night ? ?Sleeping better  ? ?The patient denies symptoms of fevers, chills, cough, or new SOB worrisome for COVID 19.   ? ?Past Medical History:  ?Diagnosis Date  ? Anxiety   ? Arthritis   ? inactive RA- no meds  ? ASD (atrial septal defect)   ? Closure   ? Depression   ? Dysrhythmia   ? PVCs  ? Headache(784.0)   ? migraines  ? Hemochromatosis   ? IBS (irritable bowel syndrome)   ? Orthostatic syncope   ? POTS (postural orthostatic tachycardia syndrome)   ? Rheumatoid arthritis(714.0) 02/04/2012  ? ? ?Past Surgical History:  ?Procedure Laterality Date  ? CHOLECYSTECTOMY  1989  ? DILATION AND CURETTAGE OF UTERUS  2004  ? LAPAROSCOPIC TUBAL LIGATION  10/26/2010  ? Procedure: LAPAROSCOPIC TUBAL LIGATION;  Surgeon: Marylynn Pearson;  Location: [facility];  Service: Gynecology;  Laterality: Bilateral;  ? LAPAROSCOPY    ? PATENT FORAMEN OVALE CLOSURE  2008  ? ? ?Current Outpatient Medications  ?Medication Sig Dispense Refill  ? acetaminophen (TYLENOL) 500 MG tablet Take 1,000 mg by mouth every 6 (six) hours as needed for moderate pain.    ? amitriptyline (ELAVIL)  25 MG tablet Take 1 tablet (25 mg total) by mouth at bedtime. 90 tablet 3  ? clonazePAM (KLONOPIN) 1 MG tablet Take 1 mg by mouth every 4 (four) hours as needed for anxiety. Can take up to 6    ? cyclobenzaprine (FLEXERIL) 10 MG tablet Take 10 mg by mouth at bedtime.    ? desvenlafaxine (PRISTIQ) 50 MG 24 hr tablet Take 50 mg by mouth daily.     ? diphenhydrAMINE (BENADRYL) 25 MG tablet Take 50 mg by mouth every 6 (six) hours as needed for itching or allergies (hives from Enbrel injection site).    ? ENBREL MINI 50 MG/ML SOCT Inject 15 mLs into the  muscle once a week.    ? ondansetron (ZOFRAN-ODT) 8 MG disintegrating tablet DISSOLVE 1 TABLET(8 MG) INSIDE CHEEK DAILY AS NEEDED FOR NAUSEA OR VOMITING 30 tablet 5  ? propranolol (INDERAL) 20 MG tablet TAKE 1 TABLET BY MOUTH TWICE DAILY AS NEEDED FOR INCREASE HEART RATE (Patient taking differently: 10 mg. Takes 10 mg bid) 180 tablet 2  ? topiramate (TOPAMAX) 100 MG tablet Take 200 mg by mouth at bedtime.   4  ? ?No current facility-administered medications for this visit.  ? ? ?Allergies:   Florinef [fludrocortisone], Infliximab, Leflunomide, Methotrexate derivatives, Reglan [metoclopramide], Sulfacetamide-prednisolone, Morphine and related, and Sulfonamide derivatives  ? ? ? ?Exam:   ? ?Vital Signs:  BP (!) 84/65   Pulse (!) 123   Ht 5\' 7"  (1.702 m)   Wt 165 lb (74.8 kg)   BMI 25.84 kg/m?    ? ?  ? ? ?Labs/Other Tests and Data Reviewed:   ? ?Recent Labs: ?09/28/2020: ALT 13; BUN 7; Creatinine, Ser 0.87; Hemoglobin 12.0; Platelets 133.0; Potassium 3.6; Sodium 137; TSH 2.16  ? ?Wt Readings from Last 3 Encounters:  ?06/16/21 165 lb (74.8 kg)  ?03/02/21 160 lb (72.6 kg)  ?01/05/21 156 lb (70.8 kg)  ?  ? ?Other studies personally reviewed: ?Additional studies/ records that were reviewed today include: As above   ?  ? ? ?ASSESSMENT & PLAN:   ? ?Dysautonomia with hypotension and tachycardia ? ?Rheumatoid arthritis ? ?Depression-primary and secondary ? ?Arachnodactyly ? ?SVT ? ?Ehlers-Danlos 3 Joint hypermobility ? ?Nocturnal tachycardia ? ?Nocturnal tachycardia  is awakening--reviewing the [facility] note however outlines very frequent nightmares related to PTSD.  It makes me wonder whether his nocturnal tachycardia is in fact not orthostatic but altogether secondary to her nightmares.  If this is the case, perhaps therapy directed at the nightmares i.e. prazosin or may be clonidine in addition to the cognitive things and EMDR might go a long way or even a short way in mitigating some of the symptoms. ? ?Her daytime  hypotension remains an issue.  Part of it is reactive.  I have encouraged her to use her compression wear regularly. ?  ? ?Continue salt and water repletion. ? ?Discussed medication options as outlined below. ? ? ?  ? ? ? ? ?  ?COVID 19 screen ?The patient denies symptoms of COVID 19 at this time.  The importance of social distancing was discussed today. ? ?Follow-up:  89M  telehealth visit   ?  ? ?Current medicines are reviewed at length with the patient today.   ?The patient does not have concerns regarding her medicines.  The following changes were made today:  continue amitriptyline 25 hs ?Clonidine discuss with [place] ?Prasozin discuss with [facility] for nightmares; might she benefit from higher doses of amitrypitiline  ? ?Labs/ tests  ordered today include: none ?  ? ? ? ?Future tests ( post COVID )  ? in   months ? ?Patient Risk:  after full review of this patients clinical status, I feel that they are at moderate risk at this time. ?  ? ?Signed, ?Virl Axe, MD  ?06/16/2021 5:42 PM    ? ?[facility] ?[address] ?[address] ?[place] [address] ?[phone] (office) ?[phone]) ? ?

## 2021-06-16 NOTE — Patient Instructions (Addendum)
Medication Instructions:  ?Your physician recommends that you continue on your current medications as directed. Please refer to the Current Medication list given to you today. ? ?*If you need a refill on your cardiac medications before your next appointment, please call your pharmacy* ? ? ?Lab Work: ?None ordered. ? ?If you have labs (blood work) drawn today and your tests are completely normal, you will receive your results only by: ?MyChart Message (if you have MyChart) OR ?A paper copy in the mail ?If you have any lab test that is abnormal or we need to change your treatment, we will call you to review the results. ? ? ?Testing/Procedures: ?None ordered. ? ? ? ?Follow-Up: ?At [facility], you and your health needs are our priority.  As part of our continuing mission to provide you with exceptional heart care, we have created designated Provider Care Teams.  These Care Teams include your primary Cardiologist (physician) and Advanced Practice Providers (APPs -  Physician Assistants and Nurse Practitioners) who all work together to provide you with the care you need, when you need it. ? ?We recommend signing up for the patient portal called "MyChart".  Sign up information is provided on this After Visit Summary.  MyChart is used to connect with patients for Virtual Visits (Telemedicine).  Patients are able to view lab/test results, encounter notes, upcoming appointments, etc.  Non-urgent messages can be sent to your provider as well.   ?To learn more about what you can do with MyChart, go to [email or web address].   ? ?Your next appointment:   ?3 months with Dr Graciela HusbandsKlein - I will have his scheduler call you to schedule this appointment. ? ?Important Information About Sugar ? ? ? ? ?  ?

## 2021-06-23 DIAGNOSIS — R5383 Other fatigue: Secondary | ICD-10-CM | POA: Diagnosis not present

## 2021-06-23 DIAGNOSIS — Q7962 Hypermobile Ehlers-Danlos syndrome: Secondary | ICD-10-CM | POA: Diagnosis not present

## 2021-06-23 DIAGNOSIS — R419 Unspecified symptoms and signs involving cognitive functions and awareness: Secondary | ICD-10-CM | POA: Diagnosis not present

## 2021-06-23 DIAGNOSIS — I950 Idiopathic hypotension: Secondary | ICD-10-CM | POA: Diagnosis not present

## 2021-08-04 ENCOUNTER — Encounter: Payer: Self-pay | Admitting: Internal Medicine

## 2021-08-09 NOTE — Telephone Encounter (Signed)
Jennifer [name] I would have her take the clonidine 0.1mg   1x day for 5 days then every other day for 8 days ( 4 doses) And why dont we have her either come in for an ECG or try and send a strip from her home technology Thanks SK

## 2021-09-14 ENCOUNTER — Telehealth: Payer: Medicare Other | Admitting: Internal Medicine

## 2021-09-27 ENCOUNTER — Ambulatory Visit: Payer: Medicare Other | Admitting: Family Medicine

## 2021-09-28 ENCOUNTER — Telehealth (INDEPENDENT_AMBULATORY_CARE_PROVIDER_SITE_OTHER): Payer: Medicare Other | Admitting: Internal Medicine

## 2021-09-28 ENCOUNTER — Encounter: Payer: Self-pay | Admitting: Internal Medicine

## 2021-09-28 VITALS — BP 67/52 | HR 130 | Ht 67.0 in | Wt 166.0 lb

## 2021-09-28 DIAGNOSIS — G901 Familial dysautonomia [Riley-Day]: Secondary | ICD-10-CM | POA: Diagnosis not present

## 2021-09-28 DIAGNOSIS — I951 Orthostatic hypotension: Secondary | ICD-10-CM | POA: Diagnosis not present

## 2021-09-28 DIAGNOSIS — I471 Supraventricular tachycardia: Secondary | ICD-10-CM

## 2021-09-28 NOTE — Patient Instructions (Addendum)
Medication Instructions:  Your physician recommends that you continue on your current medications as directed. Please refer to the Current Medication list given to you today.   May try non extended low dose Clonidine  *If you need a refill on your cardiac medications before your next appointment, please call your pharmacy*   Lab Work:  Aldosterone Level with your next lab draw  If you have labs (blood work) drawn today and your tests are completely normal, you will receive your results only by: MyChart Message (if you have MyChart) OR A paper copy in the mail If you have any lab test that is abnormal or we need to change your treatment, we will call you to review the results.   Testing/Procedures: None ordered.    Follow-Up: At [facility], you and your health needs are our priority.  As part of our continuing mission to provide you with exceptional heart care, we have created designated Provider Care Teams.  These Care Teams include your primary Cardiologist (physician) and Advanced Practice Providers (APPs -  Physician Assistants and Nurse Practitioners) who all work together to provide you with the care you need, when you need it.  We recommend signing up for the patient portal called "MyChart".  Sign up information is provided on this After Visit Summary.  MyChart is used to connect with patients for Virtual Visits (Telemedicine).  Patients are able to view lab/test results, encounter notes, upcoming appointments, etc.  Non-urgent messages can be sent to your provider as well.   To learn more about what you can do with MyChart, go to [email or web address].    Your next appointment:   3 months with Dr [name] Luiz Blare Visit - 01/04/2022 at 345pm  Important Information About Sugar

## 2021-09-28 NOTE — Progress Notes (Signed)
Electrophysiology TeleHealth Note   Due to national recommendations of social distancing due to COVID 19, an audio/video telehealth visit is felt to be most appropriate for this patient at this time.  See MyChart message from today for the patient's consent to telehealth for [facility].   Date:  09/28/2021   ID:  Jennifer [name], DOB September 03, 1965, MRN [id number]  Location: patient's home  Provider location: [address], [place]  Evaluation Performed: Follow-up visit  PCP:  Deeann Saint, MD  Cardiologist:    Electrophysiologist:  SK   Chief Complaint:  Low BP and dysautonomia  History of Present Illness:    Jennifer [name] is a 56 y.o. female who presents via audio/video conferencing for a telehealth visit today.  Since last being seen in our clinic  the patient reports having continued to struggle with high HR and low BP.    They worsened this summer and then significantly following the death of her father in 2022-12-25- devestating emotionally and she is "stoic" and BP and HR have been poorly tolerating   Has spent most of her time in bed or chair    She was seen   [facility] 12/2 with multiple complaints and intolerances to multiple med outlined, significant fatigue *and she recounts "no more options" More pain 2/2 rheumatoid  Nauseated w nausea --decreased appetite and decreased PO intake with decreased fluids  Wearing a compression garment feet to abdomen  Seen 10/22 while I was away ([place]) with complaints of blood pressures in the 50s-80s and heart rates up to the 150s.  Orthostatics in the office that day pulse 75--119 and blood pressure 96--84; symptom events were associated with PACs the overall burden of which was about 5.9% and were evenly distributed over the 24-hour period.   Describing nocturnal palpitations, interestingly, she awakens at night her heart rates are 80 or so she stands up and her heart rates are 130.  She frequently ends up waking at night  to urinate.  Syncope 12/22 in the bathroom.      Previous medications used:  Midodrine-y SE Florinef-y  SE Droxydopa-y SE  Mestinon-y SE Beta blockers-y Ivabradine-y Zofran -y Serotonin drugs-y Anticholinergics-y IV fluids -y Compression- y   With tachycardia>. Tried clonidine with "zombie">.weaned off [place] recommended florinef and midodrine  she declined 2/2 prior side effects  Now on propranolol 3 tid Fluid intake about 2.5 L Raised HOB/compression Doing Wall pilates   has CUBII but not working   Very fatigued no change over months >> no change w Propranolol Feels like BP bottom out, and sometimes cannot be recorded ( below 60/40)  2x/week  Worried about weight gain Sleep is better on the amitriptyline  Increasing brain fog, has sold car.   Using apple watch--    Past Medical History:  Diagnosis Date   Anxiety    Arthritis    inactive RA- no meds   ASD (atrial septal defect)    Closure    Depression    Dysrhythmia    PVCs   Headache(784.0)    migraines   Hemochromatosis    IBS (irritable bowel syndrome)    Orthostatic syncope    POTS (postural orthostatic tachycardia syndrome)    Rheumatoid arthritis(714.0) 02/04/2012    Past Surgical History:  Procedure Laterality Date   CHOLECYSTECTOMY  1989   DILATION AND CURETTAGE OF UTERUS  2004   LAPAROSCOPIC TUBAL LIGATION  10/26/2010   Procedure: LAPAROSCOPIC TUBAL LIGATION;  Surgeon:  Zelphia Cairo;  Location: [facility];  Service: Gynecology;  Laterality: Bilateral;   LAPAROSCOPY     PATENT FORAMEN OVALE CLOSURE  2008    Current Outpatient Medications  Medication Sig Dispense Refill   acetaminophen (TYLENOL) 500 MG tablet Take 1,000 mg by mouth every 6 (six) hours as needed for moderate pain.     amitriptyline (ELAVIL) 25 MG tablet Take 1 tablet (25 mg total) by mouth at bedtime. 90 tablet 3   clonazePAM (KLONOPIN) 1 MG tablet Take 1 mg by mouth every 4 (four) hours as needed for anxiety. Can take up to  6     cyclobenzaprine (FLEXERIL) 10 MG tablet Take 10 mg by mouth at bedtime.     desvenlafaxine (PRISTIQ) 50 MG 24 hr tablet Take 50 mg by mouth daily.      diphenhydrAMINE (BENADRYL) 25 MG tablet Take 50 mg by mouth every 6 (six) hours as needed for itching or allergies (hives from Enbrel injection site).     ENBREL MINI 50 MG/ML SOCT Inject 15 mLs into the muscle once a week.     ondansetron (ZOFRAN-ODT) 8 MG disintegrating tablet DISSOLVE 1 TABLET(8 MG) INSIDE CHEEK DAILY AS NEEDED FOR NAUSEA OR VOMITING 30 tablet 5   propranolol (INDERAL) 20 MG tablet TAKE 1 TABLET BY MOUTH TWICE DAILY AS NEEDED FOR INCREASE HEART RATE 180 tablet 2   topiramate (TOPAMAX) 100 MG tablet Take 200 mg by mouth at bedtime.   4   No current facility-administered medications for this visit.    Allergies:   Florinef [fludrocortisone], Infliximab, Leflunomide, Methotrexate derivatives, Reglan [metoclopramide], Sulfacetamide-prednisolone, Morphine and related, and Sulfonamide derivatives     Exam:    Vital Signs:  BP (!) 67/52 Comment: patient provided  Pulse (!) 130 Comment: patient provided  Ht 5\' 7"  (1.702 m)   Wt 166 lb (75.3 kg) Comment: patient provided  BMI 26.00 kg/m         Labs/Other Tests and Data Reviewed:    Recent Labs: 09/28/2020: ALT 13; BUN 7; Creatinine, Ser 0.87; Hemoglobin 12.0; Platelets 133.0; Potassium 3.6; Sodium 137; TSH 2.16   Wt Readings from Last 3 Encounters:  09/28/21 166 lb (75.3 kg)  06/16/21 165 lb (74.8 kg)  03/02/21 160 lb (72.6 kg)     Other studies personally reviewed: Additional studies/ records that were reviewed today include: As above       ASSESSMENT & PLAN:    Dysautonomia with hypotension and tachycardia  Rheumatoid arthritis  Depression-primary and secondary  Arachnodactyly  SVT  Ehlers-Danlos 3 Joint hypermobility  Nocturnal tachycardia  Nocturnal tachycardia  is awakening--reviewing the [place] note however outlines very frequent  nightmares related to PTSD.  It makes me wonder whether his nocturnal tachycardia is in fact not orthostatic but altogether secondary to her nightmares.  If this is the case, perhaps therapy directed at the nightmares i.e. prazosin or may be clonidine in addition to the cognitive things and EMDR might go a long way or even a short way in mitigating some of the symptoms.  Her daytime hypotension remains an issue.  Part of it is reactive.  I have encouraged her to use her compression wear regularly.    Continue salt and water repletion.  Discussed medication options as outlined below.  Try CUBII  Try low dose clonidine non extended       COVID 19 screen The patient denies symptoms of COVID 19 at this time.  The importance of social distancing was discussed today.  Follow-up:  47M  telehealth visit      Current medicines are reviewed at length with the patient today.   The patient does not have concerns regarding her medicines.  The following changes were made today:  continue amitriptyline 25 hs   Draw aldosterone level Paper PMID [id number]  assoc of hypotension and hemachromatoisis    Future tests ( post COVID )   in   months  Patient Risk:  after full review of this patients clinical status, I feel that they are at moderate risk at this time.    Signed, Sherryl Manges, MD  09/28/2021 2:00 PM     [facility] [address] [place] [address] [phone] (office) [phone] (fax)

## 2021-10-05 ENCOUNTER — Ambulatory Visit (INDEPENDENT_AMBULATORY_CARE_PROVIDER_SITE_OTHER): Payer: Medicare Other | Admitting: Family Medicine

## 2021-10-05 VITALS — BP 94/74 | HR 92 | Temp 97.6°F | Wt 168.2 lb

## 2021-10-05 DIAGNOSIS — Z780 Asymptomatic menopausal state: Secondary | ICD-10-CM

## 2021-10-05 DIAGNOSIS — R635 Abnormal weight gain: Secondary | ICD-10-CM | POA: Diagnosis not present

## 2021-10-05 DIAGNOSIS — Z8669 Personal history of other diseases of the nervous system and sense organs: Secondary | ICD-10-CM

## 2021-10-05 DIAGNOSIS — G90A Postural orthostatic tachycardia syndrome (POTS): Secondary | ICD-10-CM | POA: Diagnosis not present

## 2021-10-05 DIAGNOSIS — M0579 Rheumatoid arthritis with rheumatoid factor of multiple sites without organ or systems involvement: Secondary | ICD-10-CM

## 2021-10-05 NOTE — Progress Notes (Signed)
Subjective:    Patient ID: Jennifer [name], female    DOB: [birth year], 56 y.o.   MRN: [id number]  Chief Complaint  Patient presents with   Fatigue   Weight Gain    HPI Patient is a 56 yo female with pmh sig for POTS, IBS, SVT s/p ablation, h/o PFO s/p closure, dysautonomia, RA, anxiety, depression, hemochromatosis, Ehlers-Danlos type III, fibromyalgia, migraines, RLS who was seen today for f/u and ferritin.  Pt had regular f/u with [facility]for POTS.  Also had f/u with local Cardiologist.  Pt states Cards advised her to have Aldosterone checked.  Pt currently taking propranolol multiple times per day for tachycardia.  Taking Clonidine 0.1 mg qhs.  Clonidine ER made pt feel like a "zombie".  Pt has concerns about wt gain due to menopause. Pt's h/o POTS/dysautonomia/SVT makes it difficulty for pt to exercise.  Pt interested in wt loss medications.  Pt inquires about use of Delta 9 syrup for chronic pain.  Currently taking 8 tabs of Tylenol extra strength and 4-6 Motrin most days.  Weaning off Topiramate for migraines.  Past Medical History:  Diagnosis Date   Anxiety    Arthritis    inactive RA- no meds   ASD (atrial septal defect)    Closure    Depression    Dysrhythmia    PVCs   Headache(784.0)    migraines   Hemochromatosis    IBS (irritable bowel syndrome)    Orthostatic syncope    POTS (postural orthostatic tachycardia syndrome)    Rheumatoid arthritis(714.0) 02/04/2012    Allergies  Allergen Reactions   Florinef [Fludrocortisone] Other (See Comments)    Severe migraine   Infliximab Hives, Diarrhea and Other (See Comments)    Increase heart rate,decrease blood pressure Other reaction(s): hives   Leflunomide Diarrhea   Methotrexate Derivatives Hives   Reglan [Metoclopramide]     Jerky    Sulfacetamide-Prednisolone     Other reaction(s): hives   Morphine And Related Hives and Rash    Rxn to IV morphine; has never had PO morphine   Sulfonamide Derivatives Rash     ROS General: Denies fever, chills, night sweats, changes in appetite  +wt gain, fatigue HEENT: Denies headaches, ear pain, changes in vision, rhinorrhea, sore throat CV: Denies CP, palpitations, SOB, orthopnea  +tachycardia, hypotension Pulm: Denies SOB, cough, wheezing GI: Denies abdominal pain, nausea, vomiting, diarrhea, constipation GU: Denies dysuria, hematuria, frequency, vaginal discharge Msk: Denies muscle cramps  +joint pain, myalgias Neuro: Denies weakness, numbness, tingling Skin: Denies rashes, bruising Psych: Denies depression, anxiety, hallucinations     Objective:    Blood pressure 94/74, pulse 92, temperature 97.6 F (36.4 C), temperature source Oral, weight 168 lb 3.2 oz (76.3 kg), SpO2 92 %.  Gen. Pleasant, thin, appears fatigued, in no distress, normal affect   HEENT: [place]/AT, face symmetric, conjunctiva clear, no scleral icterus, PERRLA, EOMI, nares patent without drainage Lungs: no accessory muscle use, CTAB, no wheezes or rales Cardiovascular: RRR, no m/r/g, no peripheral edema Musculoskeletal: No deformities, no cyanosis or clubbing, normal tone Neuro:  A&Ox3, CN II-XII intact, normal gait Skin:  Warm, no lesions/ rash   Wt Readings from Last 3 Encounters:  10/05/21 168 lb 3.2 oz (76.3 kg)  09/28/21 166 lb (75.3 kg)  06/16/21 165 lb (74.8 kg)    Lab Results  Component Value Date   WBC 5.9 09/28/2020   HGB 12.0 09/28/2020   HCT 36.0 09/28/2020   PLT 133.0 (L) 09/28/2020   GLUCOSE  97 09/28/2020   ALT 13 09/28/2020   AST 15 09/28/2020   NA 137 09/28/2020   K 3.6 09/28/2020   CL 107 09/28/2020   CREATININE 0.87 09/28/2020   BUN 7 09/28/2020   CO2 23 09/28/2020   TSH 2.16 09/28/2020   HGBA1C 5.6 09/28/2020    Assessment/Plan:  POTS (postural orthostatic tachycardia syndrome) -continue po fluids and sodium -continue current medicaitons -continue f/u with current providers  Hereditary hemochromatosis (HCC)  - Plan: Iron, TIBC and  Ferritin Panel, CBC with Differential/Platelet  Weight gain -advised against wt loss medications at this time given h/o allergies and current hx. -continue wall piliates as tolerated.   Menopause  Seropositive rheumatoid arthritis of multiple sites (HCC) -continue enbrel -continue f/u with Rheumatology  Hx of migraines -continue topimax wean  In regards to aldosterone test, pt advised in clinic that several factors can affect testing result.  Sample would need to be obtained early am.  Pt would need to be upright for a certain amount of time before drawing sample.  It is recommended that beta blockers be stopped for several wks prior to obtaining test.  Pt is currently requiring propranolol multiple times a day.  An isolated Aldosterone measurement without appropriate prep is of little clinical value.     Also when evaluating for possible adrenal disorders, renin and bmp need to be drawn at the same time aldosterone is done.  Renin samples are liable and variations in handling can affect results as well as diuretic use, dietary sodium intake, posture, etc.  Given the above factors that can affect test it appears it would have less value than intended.  If there is still a desire to peruse test, would have requesting provider obtain it.  F/u prn  Abbe Amsterdam, MD

## 2021-10-10 ENCOUNTER — Encounter: Payer: Self-pay | Admitting: Family Medicine

## 2021-10-11 ENCOUNTER — Ambulatory Visit: Payer: Medicare Other | Admitting: Family Medicine

## 2021-10-17 ENCOUNTER — Encounter: Payer: Self-pay | Admitting: Internal Medicine

## 2021-10-17 DIAGNOSIS — Z862 Personal history of diseases of the blood and blood-forming organs and certain disorders involving the immune mechanism: Secondary | ICD-10-CM

## 2021-10-17 DIAGNOSIS — G901 Familial dysautonomia [Riley-Day]: Secondary | ICD-10-CM

## 2021-10-17 DIAGNOSIS — I471 Supraventricular tachycardia: Secondary | ICD-10-CM

## 2021-10-17 DIAGNOSIS — I951 Orthostatic hypotension: Secondary | ICD-10-CM

## 2021-10-18 NOTE — Progress Notes (Incomplete)
Subjective:    Patient ID: Windle Guard Keetch, female    DOB: Feb 28, 1966, 56 y.o.   MRN: [id number]  Chief Complaint  Patient presents with  . Fatigue  . Weight Gain    HPI Patient is a 56 yo female with pmh sig for POTS, IBS, SVT s/p ablation, h/o PFO s/p closure, dysautonomia, RA, anxiety, depression, hemochromatosis, Ehlers-Danlos type III, fibromyalgia, migraines, RLS who was seen today for f/u and ferritin.  Pt had regular f/u with [facility]for POTS.  Also had f/u with local Cardiologist.  Pt states Cards advised her to have Aldosterone checked.  Pt currently taking propranolol multiple times per day for tachycardia.  Taking Clonidine 0.1 mg qhs.  Clonidine ER made pt feel like a "zombie".  Pt has concerns about wt gain due to menopause. Pt's h/o POTS/dysautonomia/SVT makes it difficulty for pt to exercise.  Pt interested in wt loss medications.  Pt inquires about use of Delta 9 syrup for chronic pain.  Currently taking 8 tabs of Tylenol extra strength and 4-6 Motrin most days.  Weaning off Topiramate for migraines.  Past Medical History:  Diagnosis Date  . Anxiety   . Arthritis    inactive RA- no meds  . ASD (atrial septal defect)    Closure   . Depression   . Dysrhythmia    PVCs  . Headache(784.0)    migraines  . Hemochromatosis   . IBS (irritable bowel syndrome)   . Orthostatic syncope   . POTS (postural orthostatic tachycardia syndrome)   . Rheumatoid arthritis(714.0) 02/04/2012    Allergies  Allergen Reactions  . Florinef [Fludrocortisone] Other (See Comments)    Severe migraine  . Infliximab Hives, Diarrhea and Other (See Comments)    Increase heart rate,decrease blood pressure Other reaction(s): hives  . Leflunomide Diarrhea  . Methotrexate Derivatives Hives  . Reglan [Metoclopramide]     Jerky   . Sulfacetamide-Prednisolone     Other reaction(s): hives  . Morphine And Related Hives and Rash    Rxn to IV morphine; has never had PO morphine  .  Sulfonamide Derivatives Rash    ROS General: Denies fever, chills, night sweats, changes in appetite  +wt gain, fatigue HEENT: Denies headaches, ear pain, changes in vision, rhinorrhea, sore throat CV: Denies CP, palpitations, SOB, orthopnea  +tachycardia, hypotension Pulm: Denies SOB, cough, wheezing GI: Denies abdominal pain, nausea, vomiting, diarrhea, constipation GU: Denies dysuria, hematuria, frequency, vaginal discharge Msk: Denies muscle cramps  +joint pain, myalgias Neuro: Denies weakness, numbness, tingling Skin: Denies rashes, bruising Psych: Denies depression, anxiety, hallucinations     Objective:    Blood pressure 94/74, pulse 92, temperature 97.6 F (36.4 C), temperature source Oral, weight 168 lb 3.2 oz (76.3 kg), SpO2 92 %.  Gen. Pleasant, thin, appears fatigued, in no distress, normal affect   HEENT: [place]/AT, face symmetric, conjunctiva clear, no scleral icterus, PERRLA, EOMI, nares patent without drainage Lungs: no accessory muscle use, CTAB, no wheezes or rales Cardiovascular: RRR, no m/r/g, no peripheral edema Musculoskeletal: No deformities, no cyanosis or clubbing, normal tone Neuro:  A&Ox3, CN II-XII intact, normal gait Skin:  Warm, no lesions/ rash   Wt Readings from Last 3 Encounters:  10/05/21 168 lb 3.2 oz (76.3 kg)  09/28/21 166 lb (75.3 kg)  06/16/21 165 lb (74.8 kg)    Lab Results  Component Value Date   WBC 5.9 09/28/2020   HGB 12.0 09/28/2020   HCT 36.0 09/28/2020   PLT 133.0 (L) 09/28/2020   GLUCOSE  97 09/28/2020   ALT 13 09/28/2020   AST 15 09/28/2020   NA 137 09/28/2020   K 3.6 09/28/2020   CL 107 09/28/2020   CREATININE 0.87 09/28/2020   BUN 7 09/28/2020   CO2 23 09/28/2020   TSH 2.16 09/28/2020   HGBA1C 5.6 09/28/2020    Assessment/Plan:  POTS (postural orthostatic tachycardia syndrome) -continue po fluids and sodium -continue current medicaitons -continue f/u with current providers  Hereditary hemochromatosis (HCC)  -  Plan: Iron, TIBC and Ferritin Panel, CBC with Differential/Platelet  Weight gain -advised against wt loss medications at this time given h/o allergies and current hx. -continue wall piliates as tolerated.   Menopause  Seropositive rheumatoid arthritis of multiple sites (HCC) -continue enbrel -continue f/u with Rheumatology  Hx of migraines -continue topimax wean  In regards to aldosterone test, pt advised in clinic that several factors can affect testing result.  An isolated Aldosterone measurement is  F/u prn  Abbe Amsterdam, MD

## 2021-10-19 ENCOUNTER — Encounter: Payer: Self-pay | Admitting: Family Medicine

## 2021-10-20 ENCOUNTER — Other Ambulatory Visit: Payer: Medicare Other

## 2021-10-24 ENCOUNTER — Other Ambulatory Visit: Payer: Medicare Other

## 2021-10-27 ENCOUNTER — Encounter: Payer: Self-pay | Admitting: Internal Medicine

## 2021-10-27 ENCOUNTER — Other Ambulatory Visit: Payer: Self-pay | Admitting: Internal Medicine

## 2021-10-27 DIAGNOSIS — Z862 Personal history of diseases of the blood and blood-forming organs and certain disorders involving the immune mechanism: Secondary | ICD-10-CM

## 2021-10-27 NOTE — Progress Notes (Signed)
Ferritin lab order per Dr. [name].

## 2021-11-03 ENCOUNTER — Other Ambulatory Visit: Payer: Medicare Other

## 2021-11-10 ENCOUNTER — Ambulatory Visit: Payer: Medicare Other | Attending: Internal Medicine

## 2021-11-10 DIAGNOSIS — Z862 Personal history of diseases of the blood and blood-forming organs and certain disorders involving the immune mechanism: Secondary | ICD-10-CM

## 2021-11-11 LAB — FERRITIN: Ferritin: 224 ng/mL — ABNORMAL HIGH (ref 15–150)

## 2021-11-12 ENCOUNTER — Encounter: Payer: Self-pay | Admitting: Internal Medicine

## 2021-11-12 DIAGNOSIS — Z862 Personal history of diseases of the blood and blood-forming organs and certain disorders involving the immune mechanism: Secondary | ICD-10-CM

## 2021-11-14 ENCOUNTER — Ambulatory Visit: Payer: Medicare PPO

## 2021-11-28 DIAGNOSIS — Q796 Ehlers-Danlos syndrome, unspecified: Secondary | ICD-10-CM | POA: Diagnosis not present

## 2021-11-28 DIAGNOSIS — M25552 Pain in left hip: Secondary | ICD-10-CM | POA: Diagnosis not present

## 2021-11-28 DIAGNOSIS — Z79899 Other long term (current) drug therapy: Secondary | ICD-10-CM | POA: Diagnosis not present

## 2021-11-28 DIAGNOSIS — M25551 Pain in right hip: Secondary | ICD-10-CM | POA: Diagnosis not present

## 2021-11-28 DIAGNOSIS — R5383 Other fatigue: Secondary | ICD-10-CM | POA: Diagnosis not present

## 2021-11-28 DIAGNOSIS — M545 Low back pain, unspecified: Secondary | ICD-10-CM | POA: Diagnosis not present

## 2021-11-28 DIAGNOSIS — M0579 Rheumatoid arthritis with rheumatoid factor of multiple sites without organ or systems involvement: Secondary | ICD-10-CM | POA: Diagnosis not present

## 2021-11-28 DIAGNOSIS — M797 Fibromyalgia: Secondary | ICD-10-CM | POA: Diagnosis not present

## 2021-12-08 ENCOUNTER — Telehealth: Payer: Self-pay | Admitting: Hematology

## 2021-12-08 NOTE — Telephone Encounter (Signed)
Scheduled appt per 10/5 referral. Pt is aware of appt date and time. Pt is aware to arrive 15 mins prior to appt time and to bring and updated insurance card. Pt is aware of appt location.   

## 2021-12-14 DIAGNOSIS — H02885 Meibomian gland dysfunction left lower eyelid: Secondary | ICD-10-CM | POA: Diagnosis not present

## 2021-12-14 DIAGNOSIS — H04123 Dry eye syndrome of bilateral lacrimal glands: Secondary | ICD-10-CM | POA: Diagnosis not present

## 2021-12-14 DIAGNOSIS — H04121 Dry eye syndrome of right lacrimal gland: Secondary | ICD-10-CM | POA: Diagnosis not present

## 2021-12-14 DIAGNOSIS — H02882 Meibomian gland dysfunction right lower eyelid: Secondary | ICD-10-CM | POA: Diagnosis not present

## 2021-12-25 ENCOUNTER — Encounter: Payer: Self-pay | Admitting: Internal Medicine

## 2021-12-25 DIAGNOSIS — G901 Familial dysautonomia [Riley-Day]: Secondary | ICD-10-CM

## 2021-12-25 DIAGNOSIS — G90A Postural orthostatic tachycardia syndrome (POTS): Secondary | ICD-10-CM

## 2021-12-26 ENCOUNTER — Encounter: Payer: Self-pay | Admitting: Internal Medicine

## 2022-01-01 ENCOUNTER — Inpatient Hospital Stay: Payer: Medicare Other | Attending: Hematology | Admitting: Hematology

## 2022-01-02 ENCOUNTER — Telehealth: Payer: Self-pay | Admitting: Hematology

## 2022-01-02 NOTE — Telephone Encounter (Signed)
R/s pt's new hem appt per pt request. Pt is aware of new appt date/time.  

## 2022-01-04 ENCOUNTER — Telehealth: Payer: Medicare Other | Admitting: Internal Medicine

## 2022-01-05 ENCOUNTER — Other Ambulatory Visit: Payer: Self-pay

## 2022-01-05 NOTE — Telephone Encounter (Signed)
Pt's pharmacy requesting a refill on ondansetron. Would Dr. Caryl Comes like to refill this medication? Please address

## 2022-01-08 ENCOUNTER — Encounter: Payer: Self-pay | Admitting: Internal Medicine

## 2022-01-08 MED ORDER — ONDANSETRON 8 MG PO TBDP: ORAL_TABLET | ORAL | 3 refills | Status: DC

## 2022-01-12 ENCOUNTER — Ambulatory Visit: Payer: Medicare Other | Attending: Student

## 2022-01-12 DIAGNOSIS — G90A Postural orthostatic tachycardia syndrome (POTS): Secondary | ICD-10-CM

## 2022-01-12 DIAGNOSIS — G901 Familial dysautonomia [Riley-Day]: Secondary | ICD-10-CM | POA: Diagnosis not present

## 2022-01-19 ENCOUNTER — Telehealth: Payer: Medicare Other | Admitting: Internal Medicine

## 2022-01-20 LAB — ALDOSTERONE + RENIN ACTIVITY W/ RATIO
Aldos/Renin Ratio: >28.1 (ref 0.0–30.0)
Aldosterone: 4.7 ng/dL (ref 0.0–30.0)
Renin Activity, Plasma: <0.167 ng/mL/hr — ABNORMAL LOW (ref 0.167–5.380)

## 2022-01-22 NOTE — Progress Notes (Signed)
Aldosterone drawn at Dr. Odessa Fleming request; Renin ration added for completeness.  Will forward for his consideration and discussion

## 2022-01-30 ENCOUNTER — Ambulatory Visit (INDEPENDENT_AMBULATORY_CARE_PROVIDER_SITE_OTHER): Payer: Medicare Other | Admitting: Orthopaedic Surgery

## 2022-01-30 ENCOUNTER — Ambulatory Visit (INDEPENDENT_AMBULATORY_CARE_PROVIDER_SITE_OTHER): Payer: Medicare Other

## 2022-01-30 ENCOUNTER — Encounter: Payer: Self-pay | Admitting: Orthopaedic Surgery

## 2022-01-30 ENCOUNTER — Inpatient Hospital Stay: Payer: Medicare Other | Attending: Hematology | Admitting: Hematology

## 2022-01-30 DIAGNOSIS — M25552 Pain in left hip: Secondary | ICD-10-CM

## 2022-01-30 DIAGNOSIS — M7062 Trochanteric bursitis, left hip: Secondary | ICD-10-CM

## 2022-01-30 MED ORDER — METHYLPREDNISOLONE ACETATE 40 MG/ML IJ SUSP
40.0000 mg | INTRAMUSCULAR | Status: AC | PRN
  Administered 2022-01-30: 40 mg via INTRA_ARTICULAR

## 2022-01-30 MED ORDER — LIDOCAINE HCL 1 % IJ SOLN
3.0000 mL | INTRAMUSCULAR | Status: AC | PRN
  Administered 2022-01-30: 3 mL

## 2022-01-30 NOTE — Progress Notes (Shared)
HEMATOLOGY/ONCOLOGY CONSULTATION NOTE  Date of Service: 01/30/2022  Patient Care Team: Deeann Saint, MD as PCP - General (Family Medicine) Levert Feinstein, MD as Consulting Physician (Oncology) Duke Salvia, MD as Consulting Physician (Cardiology)  CHIEF COMPLAINTS/PURPOSE OF CONSULTATION:  Hemochromatosis  HISTORY OF PRESENTING ILLNESS:  Jennifer [name] is a wonderful 56 y.o. female who has been referred to [place] by Dr. Sherryl Manges, MD, for evaluation and management of Hemochromatosis.    MEDICAL HISTORY:  Past Medical History:  Diagnosis Date   Anxiety    Arthritis    inactive RA- no meds   ASD (atrial septal defect)    Closure    Depression    Dysrhythmia    PVCs   Headache(784.0)    migraines   Hemochromatosis    IBS (irritable bowel syndrome)    Orthostatic syncope    POTS (postural orthostatic tachycardia syndrome)    Rheumatoid arthritis(714.0) 02/04/2012    SURGICAL HISTORY: Past Surgical History:  Procedure Laterality Date   CHOLECYSTECTOMY  1989   DILATION AND CURETTAGE OF UTERUS  2004   LAPAROSCOPIC TUBAL LIGATION  10/26/2010   Procedure: LAPAROSCOPIC TUBAL LIGATION;  Surgeon: Zelphia Cairo;  Location: [facility];  Service: Gynecology;  Laterality: Bilateral;   LAPAROSCOPY     PATENT FORAMEN OVALE CLOSURE  2008    SOCIAL HISTORY: Social History   Socioeconomic History   Marital status: Divorced    Spouse name: Not on file   Number of children: Not on file   Years of education: Not on file   Highest education level: Bachelor's degree (e.g., BA, AB, BS)  Occupational History   Not on file  Tobacco Use   Smoking status: Never    Passive exposure: Past   Smokeless tobacco: Never  Vaping Use   Vaping Use: Never used  Substance and Sexual Activity   Alcohol use: Yes    Comment: occasionally.   Drug use: No   Sexual activity: Not on file  Other Topics Concern   Not on file  Social History Narrative   Not on file   Social  Determinants of Health   Financial Resource Strain: Low Risk  (09/25/2021)   Overall Financial Resource Strain (CARDIA)    Difficulty of Paying Living Expenses: Not very hard  Food Insecurity: No Food Insecurity (09/25/2021)   Hunger Vital Sign    Worried About Running Out of Food in the Last Year: Never true    Ran Out of Food in the Last Year: Never true  Transportation Needs: No Transportation Needs (09/25/2021)   PRAPARE - Administrator, Civil Service (Medical): No    Lack of Transportation (Non-Medical): No  Physical Activity: Unknown (09/25/2021)   Exercise Vital Sign    Days of Exercise per Week: 0 days    Minutes of Exercise per Session: Not on file  Recent Concern: Physical Activity - Inactive (09/25/2021)   Exercise Vital Sign    Days of Exercise per Week: 0 days    Minutes of Exercise per Session: 30 min  Stress: Stress Concern Present (09/25/2021)   [employer] of Occupational Health - Occupational Stress Questionnaire    Feeling of Stress : To some extent  Social Connections: Moderately Integrated (09/25/2021)   Social Connection and Isolation Panel [NHANES]    Frequency of Communication with Friends and Family: Twice a week    Frequency of Social Gatherings with Friends and Family: More than three times a week  Attends Religious Services: 1 to 4 times per year    Active Member of Clubs or Organizations: Yes    Attends Banker Meetings: 1 to 4 times per year    Marital Status: Divorced  Intimate Partner Violence: Not At Risk (11/02/2020)   Humiliation, Afraid, Rape, and Kick questionnaire    Fear of Current or Ex-Partner: No    Emotionally Abused: No    Physically Abused: No    Sexually Abused: No    FAMILY HISTORY: Family History  Problem Relation Age of Onset   Hemochromatosis Mother    Hemochromatosis Sister     ALLERGIES:  is allergic to florinef [fludrocortisone], infliximab, leflunomide, methotrexate derivatives, reglan  [metoclopramide], sulfacetamide-prednisolone, morphine and related, and sulfonamide derivatives.  MEDICATIONS:  Current Outpatient Medications  Medication Sig Dispense Refill   acetaminophen (TYLENOL) 500 MG tablet Take 1,000 mg by mouth every 6 (six) hours as needed for moderate pain.     amitriptyline (ELAVIL) 25 MG tablet Take 1 tablet (25 mg total) by mouth at bedtime. 90 tablet 3   clonazePAM (KLONOPIN) 1 MG tablet Take 1 mg by mouth every 8 (eight) hours as needed for anxiety. Every 8 hours as needed     cloNIDine (CATAPRES) 0.1 MG tablet Take 0.1 mg by mouth at bedtime.     cyclobenzaprine (FLEXERIL) 10 MG tablet Take 10 mg by mouth at bedtime.     desvenlafaxine (PRISTIQ) 50 MG 24 hr tablet Take 50 mg by mouth daily.      diphenhydrAMINE (BENADRYL) 25 MG tablet Take 50 mg by mouth every 6 (six) hours as needed for itching or allergies (hives from Enbrel injection site).     ENBREL MINI 50 MG/ML SOCT Inject 15 mLs into the muscle once a week.     Ibuprofen (MOTRIN PO) Take by mouth. OTC. 2 tablets as needed     ondansetron (ZOFRAN-ODT) 8 MG disintegrating tablet DISSOLVE 1 TABLET(8 MG) INSIDE CHEEK DAILY AS NEEDED FOR NAUSEA OR VOMITING 30 tablet 3   propranolol (INDERAL) 20 MG tablet TAKE 1 TABLET BY MOUTH TWICE DAILY AS NEEDED FOR INCREASE HEART RATE 180 tablet 2   topiramate (TOPAMAX) 100 MG tablet Take 100 mg by mouth at bedtime.  4   No current facility-administered medications for this visit.    REVIEW OF SYSTEMS:    10 Point review of Systems was done is negative except as noted above.  PHYSICAL EXAMINATION: ECOG PERFORMANCE STATUS: {CHL ONC ECOG PQ:[id number]}  .There were no vitals filed for this visit. There were no vitals filed for this visit. .There is no height or weight on file to calculate BMI.  GENERAL:alert, in no acute distress and comfortable SKIN: no acute rashes, no significant lesions EYES: conjunctiva are pink and non-injected, sclera  anicteric OROPHARYNX: MMM, no exudates, no oropharyngeal erythema or ulceration NECK: supple, no JVD LYMPH:  no palpable lymphadenopathy in the cervical, axillary or inguinal regions LUNGS: clear to auscultation b/l with normal respiratory effort HEART: regular rate & rhythm ABDOMEN:  normoactive bowel sounds , non tender, not distended. Extremity: no pedal edema PSYCH: alert & oriented x 3 with fluent speech NEURO: no focal motor/sensory deficits  LABORATORY DATA:  I have reviewed the data as listed  .    Latest Ref Rng & Units 09/28/2020    3:54 PM 09/02/2018    6:34 PM 03/10/2018    8:37 AM  CBC  WBC 4.0 - 10.5 K/uL 5.9  4.6  5.2   Hemoglobin  12.0 - 15.0 g/dL 52.8  41.3  24.4   Hematocrit 36.0 - 46.0 % 36.0  38.2  36.9   Platelets 150.0 - 400.0 K/uL 133.0  161  201     .    Latest Ref Rng & Units 09/28/2020    3:54 PM 09/02/2018    6:34 PM 03/10/2018    8:37 AM  CMP  Glucose 70 - 99 mg/dL 97  010  79   BUN 6 - 23 mg/dL 7  6  10    Creatinine 0.40 - 1.20 mg/dL  2.72  5.36   Sodium 135 - 145 mEq/L 137  139  140   Potassium 3.5 - 5.1 mEq/L 3.6  4.4  4.0   Chloride 96 - 112 mEq/L 107  111  108   CO2 19 - 32 mEq/L 23  23  18    Calcium 8.4 - 10.5 mg/dL 8.5  9.0  9.0   Total Protein 6.0 - 8.3 g/dL 6.1   6.3   Total Bilirubin 0.2 - 1.2 mg/dL 0.3   6.44   Alkaline Phos 39 - 117 U/L 54   71   AST 0 - 37 U/L 15   23   ALT 0 - 35 U/L 13   24      RADIOGRAPHIC STUDIES: I have personally reviewed the radiological images as listed and agreed with the findings in the report. XR HIP UNILAT W OR W/O PELVIS 1V LEFT  Result Date: 01/30/2022 An AP pelvis and lateral of the left hip shows no acute findings around either hip.  The trochanteric area shows no cortical irregularity.   ASSESSMENT & PLAN:  ***  PLAN: ***  FOLLOW UP: ***  All of the patients questions were answered with apparent satisfaction. The patient knows to call the clinic with any problems, questions or  concerns.  I spent {CHL ONC TIME VISIT - <0.3 counseling the patient face to face. The total time spent in the appointment was {CHL ONC TIME VISIT - 02/01/2022 and more than 50% was on counseling and direct patient cares.    [id number]} MD MS AAHIVMS [facility] Hematology/Oncology Physician [facility]  (Office):       [phone] (Work cell):  [phone] (Fax):           [phone]  01/30/2022 9:14 AM

## 2022-01-30 NOTE — Progress Notes (Signed)
Office Visit Note   Patient: Jennifer [name]           Date of Birth: 11/29/1965           MRN: [id number] Visit Date: 01/30/2022              Requested by: Deeann Saint, MD [address] [place],  [place] [address] PCP: Deeann Saint, MD   Assessment & Plan: Visit Diagnoses:  1. Pain of left hip   2. Trochanteric bursitis, left hip     Plan: Her signs and symptoms are significant for trochanteric bursitis.  I did show her some stretching exercises I want her to try twice daily and I recommend a steroid injection over the area of maximal tenderness.  She agreed to this and tolerated it well.  I would still like her to try Voltaren gel.  We will see her back in a month to see how she is doing overall.  My next step would be considering an MRI of her left hip to look for any tears of the medius and minimus tendons if she continues to have the discomfort and pain she is having.  All questions and concerns were addressed and answered.  Follow-Up Instructions: Return in about 4 weeks (around 02/27/2022).   Orders:  Orders Placed This Encounter  Procedures   Large Joint Inj   XR HIP UNILAT W OR W/O PELVIS 1V LEFT   No orders of the defined types were placed in this encounter.     Procedures: Large Joint Inj: L greater trochanter on 01/30/2022 8:43 AM Indications: pain and diagnostic evaluation Details: 22 G 1.5 in needle, lateral approach  Arthrogram: No  Medications: 3 mL lidocaine 1 %; 40 mg methylPREDNISolone acetate 40 MG/ML Outcome: tolerated well, no immediate complications Procedure, treatment alternatives, risks and benefits explained, specific risks discussed. Consent was given by the patient. Immediately prior to procedure a time out was called to verify the correct patient, procedure, equipment, support staff and site/side marked as required. Patient was prepped and draped in the usual sterile fashion.       Clinical Data: No additional  findings.   Subjective: Chief Complaint  Patient presents with   Left Hip - Pain  The patient is a very pleasant 56 year old female with a history of Ehlers-Danlos syndrome and POTS syndrome as well as rheumatoid disease and fibromyalgia.  She has been dealing with left hip pain for a while now.  She was recently babysitting for a family member's dog and she had some falls.  Is now become painful to walk and hurts on the lateral aspect of her hip and is hard to lay on that side.  She has had a steroid injection but not over the area of tenderness of her hip.  She can use Voltaren gel but has been told to use only a small amount once daily due to her other medical issues.  She is a thin individual and active.  She does have to limit some of her exercising to just wall Pilates due to her comorbidities.  HPI  Review of Systems There is currently listed no fever, chills, nausea, vomiting  Objective: Vital Signs: There were no vitals taken for this visit.  Physical Exam She is alert and orient x 3 in no acute distress Ortho Exam On exam both hips move fluidly and fluidly with no blocks or rotation and no pain in the groin especially on her left hip.  When  I have her lay on her side with her left hip up she has significant pain all around the greater trochanter and the proximal IT band. Specialty Comments:  No specialty comments available.  Imaging: XR HIP UNILAT W OR W/O PELVIS 1V LEFT  Result Date: 01/30/2022 An AP pelvis and lateral of the left hip shows no acute findings around either hip.  The trochanteric area shows no cortical irregularity.    PMFS History: Patient Active Problem List   Diagnosis Date Noted   Seropositive rheumatoid arthritis of multiple sites ([place]) 12/29/2015   SVT (supraventricular tachycardia) 11/25/2015   Suicide attempt by beta blocker overdose ([place]) 08/11/2013   POTS (postural orthostatic tachycardia syndrome) 08/11/2013   Drug overdose, intentional ([place])  08/11/2013   Fibromyalgia 10/19/2012   Restless legs syndrome (RLS) 10/19/2012   Insomnia 10/19/2012   Orthostatic syncope/hypotension 10/17/2012   Major depression, recurrent ([place]) 07/22/2012   GAD (generalized anxiety disorder) 07/22/2012   Panic attacks 07/22/2012   Dysautonomia ([place]) 04/07/2009   HEMOCHROMATOSIS, HX OF 04/07/2009   Irritable bowel syndrome 09/22/2007   NAUSEA 09/22/2007   ABDOMINAL PAIN, LEFT LOWER QUADRANT 09/22/2007   MIGRAINES, HX OF 09/22/2007   Past Medical History:  Diagnosis Date   Anxiety    Arthritis    inactive RA- no meds   ASD (atrial septal defect)    Closure    Depression    Dysrhythmia    PVCs   Headache(784.0)    migraines   Hemochromatosis    IBS (irritable bowel syndrome)    Orthostatic syncope    POTS (postural orthostatic tachycardia syndrome)    Rheumatoid arthritis(714.0) 02/04/2012    Family History  Problem Relation Age of Onset   Hemochromatosis Mother    Hemochromatosis Sister     Past Surgical History:  Procedure Laterality Date   CHOLECYSTECTOMY  1989   DILATION AND CURETTAGE OF UTERUS  2004   LAPAROSCOPIC TUBAL LIGATION  10/26/2010   Procedure: LAPAROSCOPIC TUBAL LIGATION;  Surgeon: Marylynn Pearson;  Location: [facility];  Service: Gynecology;  Laterality: Bilateral;   LAPAROSCOPY     PATENT FORAMEN OVALE CLOSURE  2008   Social History   Occupational History   Not on file  Tobacco Use   Smoking status: Never    Passive exposure: Past   Smokeless tobacco: Never  Vaping Use   Vaping Use: Never used  Substance and Sexual Activity   Alcohol use: Yes    Comment: occasionally.   Drug use: No   Sexual activity: Not on file

## 2022-02-06 ENCOUNTER — Encounter: Payer: Self-pay | Admitting: Orthopaedic Surgery

## 2022-02-06 ENCOUNTER — Other Ambulatory Visit: Payer: Self-pay | Admitting: Orthopaedic Surgery

## 2022-02-06 DIAGNOSIS — M25552 Pain in left hip: Secondary | ICD-10-CM

## 2022-02-06 MED ORDER — DICLOFENAC SODIUM 75 MG PO TBEC: 75.0000 mg | DELAYED_RELEASE_TABLET | Freq: Two times a day (BID) | ORAL | 1 refills | Status: DC | PRN

## 2022-02-06 NOTE — Telephone Encounter (Signed)
Mri ordered 

## 2022-02-08 ENCOUNTER — Encounter: Payer: Self-pay | Admitting: Internal Medicine

## 2022-02-08 ENCOUNTER — Ambulatory Visit: Payer: Medicare Other | Admitting: Internal Medicine

## 2022-02-08 DIAGNOSIS — G90A Postural orthostatic tachycardia syndrome (POTS): Secondary | ICD-10-CM

## 2022-02-08 DIAGNOSIS — I951 Orthostatic hypotension: Secondary | ICD-10-CM

## 2022-02-08 DIAGNOSIS — I471 Supraventricular tachycardia, unspecified: Secondary | ICD-10-CM

## 2022-02-08 DIAGNOSIS — G901 Familial dysautonomia [Riley-Day]: Secondary | ICD-10-CM

## 2022-02-16 NOTE — Telephone Encounter (Signed)
Pt aware that Dr. [name] nor his nurse are in the office today. Aware it will be next week before they address this  (This is not new for the pt) Pt agreeable to plan.  (Pt asking about aldosterone result as well)

## 2022-03-01 ENCOUNTER — Ambulatory Visit: Payer: Medicare Other | Admitting: Orthopaedic Surgery

## 2022-03-02 ENCOUNTER — Other Ambulatory Visit: Payer: Medicare Other

## 2022-03-02 MED ORDER — MIDODRINE HCL 2.5 MG PO TABS: ORAL_TABLET | ORAL | 3 refills | Status: DC

## 2022-03-10 ENCOUNTER — Other Ambulatory Visit: Payer: Medicare Other

## 2022-03-13 ENCOUNTER — Ambulatory Visit: Payer: Medicare Other | Admitting: Orthopaedic Surgery

## 2022-03-13 ENCOUNTER — Other Ambulatory Visit: Payer: Self-pay

## 2022-03-13 ENCOUNTER — Ambulatory Visit (INDEPENDENT_AMBULATORY_CARE_PROVIDER_SITE_OTHER): Payer: Medicare Other

## 2022-03-13 VITALS — Ht 67.0 in | Wt 159.0 lb

## 2022-03-13 DIAGNOSIS — G901 Familial dysautonomia [Riley-Day]: Secondary | ICD-10-CM

## 2022-03-13 DIAGNOSIS — I471 Supraventricular tachycardia, unspecified: Secondary | ICD-10-CM

## 2022-03-13 DIAGNOSIS — I951 Orthostatic hypotension: Secondary | ICD-10-CM

## 2022-03-13 DIAGNOSIS — Z1231 Encounter for screening mammogram for malignant neoplasm of breast: Secondary | ICD-10-CM | POA: Diagnosis not present

## 2022-03-13 DIAGNOSIS — Z0000 Encounter for general adult medical examination without abnormal findings: Secondary | ICD-10-CM | POA: Diagnosis not present

## 2022-03-13 DIAGNOSIS — G90A Postural orthostatic tachycardia syndrome (POTS): Secondary | ICD-10-CM

## 2022-03-13 NOTE — Progress Notes (Addendum)
Subjective:   Jennifer [name] is a 57 y.o. female who presents for Medicare Annual (Subsequent) preventive examination.  Review of Systems    Virtual Visit via Telephone Note  I connected with  Jennifer [name] on 03/27/22 at  3:45 PM EST by telephone and verified that I am speaking with the correct person using two identifiers.  Location: Patient: Home Provider: Office Persons participating in the virtual visit: patient/Nurse Health Advisor   I discussed the limitations, risks, security and privacy concerns of performing an evaluation and management service by telephone and the availability of in person appointments. The patient expressed understanding and agreed to proceed.  Interactive audio and video telecommunications were attempted between this nurse and patient, however failed, due to patient having technical difficulties OR patient did not have access to video capability.  We continued and completed visit with audio only.  Some vital signs may be absent or patient reported.   Jennifer Rung, LPN  Cardiac Risk Factors include: advanced age (>34men, >23 women);Other (see comment), Risk factor comments: Dx POTS     Objective:    Today's Vitals   03/13/22 1550  Weight: 159 lb (72.1 kg)  Height: 5\' 7"  (1.702 m)   Body mass index is 24.9 kg/m.     03/13/2022    4:06 PM 11/02/2020    1:22 PM 09/02/2018    6:12 PM 04/01/2018    9:14 AM 11/25/2015    4:00 AM 11/24/2015    9:51 PM 08/11/2013    1:39 PM  Advanced Directives  Does Patient Have a Medical Advance Directive? Yes Yes No  Yes No;Yes Patient has advance directive, copy not in chart  Type of Advance Directive Healthcare Power of [place];Living will Healthcare Power of [place];Living will   Living will Healthcare Power of [place];Living will Healthcare Power of [place];Living will  Does patient want to make changes to medical advance directive?     No - Patient declined  No change requested  Copy of Healthcare Power  of Attorney in Chart? No - copy requested No - copy requested   No - copy requested No - copy requested Copy requested from family  Pre-existing out of facility DNR order (yellow form or pink MOST form)       No     Information is confidential and restricted. Go to Review Flowsheets to unlock data.    Current Medications (verified) Outpatient Encounter Medications as of 03/13/2022  Medication Sig   acetaminophen (TYLENOL) 500 MG tablet Take 1,000 mg by mouth every 6 (six) hours as needed for moderate pain.   clonazePAM (KLONOPIN) 1 MG tablet Take 1 mg by mouth every 8 (eight) hours as needed for anxiety. Every 8 hours as needed   cyclobenzaprine (FLEXERIL) 10 MG tablet Take 10 mg by mouth at bedtime.   desvenlafaxine (PRISTIQ) 50 MG 24 hr tablet Take 50 mg by mouth daily.    diclofenac (VOLTAREN) 75 MG EC tablet Take 1 tablet (75 mg total) by mouth 2 (two) times daily between meals as needed.   diphenhydrAMINE (BENADRYL) 25 MG tablet Take 50 mg by mouth every 6 (six) hours as needed for itching or allergies (hives from Enbrel injection site).   ENBREL MINI 50 MG/ML SOCT Inject 15 mLs into the muscle once a week.   Ibuprofen (MOTRIN PO) Take by mouth. OTC. 2 tablets as needed   midodrine (PROAMATINE) 2.5 MG tablet Take 1-2 tablets every 4 hours as needed   ondansetron (ZOFRAN-ODT) 8  MG disintegrating tablet DISSOLVE 1 TABLET(8 MG) INSIDE CHEEK DAILY AS NEEDED FOR NAUSEA OR VOMITING   propranolol (INDERAL) 20 MG tablet TAKE 1 TABLET BY MOUTH TWICE DAILY AS NEEDED FOR INCREASE HEART RATE   topiramate (TOPAMAX) 100 MG tablet Take 100 mg by mouth at bedtime.   [DISCONTINUED] amitriptyline (ELAVIL) 25 MG tablet Take 1 tablet (25 mg total) by mouth at bedtime.   [DISCONTINUED] cloNIDine (CATAPRES) 0.1 MG tablet Take 0.1 mg by mouth at bedtime.   No facility-administered encounter medications on file as of 03/13/2022.    Allergies (verified) Florinef [fludrocortisone], Infliximab, Leflunomide,  Methotrexate derivatives, Reglan [metoclopramide], Sulfacetamide-prednisolone, Morphine and related, and Sulfonamide derivatives   History: Past Medical History:  Diagnosis Date   Anxiety    Arthritis    inactive RA- no meds   ASD (atrial septal defect)    Closure    Depression    Dysrhythmia    PVCs   Headache(784.0)    migraines   Hemochromatosis    IBS (irritable bowel syndrome)    Orthostatic syncope    POTS (postural orthostatic tachycardia syndrome)    Rheumatoid arthritis(714.0) 02/04/2012   Past Surgical History:  Procedure Laterality Date   CHOLECYSTECTOMY  1989   DILATION AND CURETTAGE OF UTERUS  2004   LAPAROSCOPIC TUBAL LIGATION  10/26/2010   Procedure: LAPAROSCOPIC TUBAL LIGATION;  Surgeon: Zelphia Cairo;  Location: [facility];  Service: Gynecology;  Laterality: Bilateral;   LAPAROSCOPY     PATENT FORAMEN OVALE CLOSURE  2008   Family History  Problem Relation Age of Onset   Hemochromatosis Mother    Hemochromatosis Sister    Social History   Socioeconomic History   Marital status: Divorced    Spouse name: Not on file   Number of children: Not on file   Years of education: Not on file   Highest education level: Bachelor's degree (e.g., BA, AB, BS)  Occupational History   Not on file  Tobacco Use   Smoking status: Never    Passive exposure: Past   Smokeless tobacco: Never  Vaping Use   Vaping Use: Never used  Substance and Sexual Activity   Alcohol use: Yes    Comment: occasionally.   Drug use: No   Sexual activity: Not on file  Other Topics Concern   Not on file  Social History Narrative   Not on file   Social Determinants of Health   Financial Resource Strain: Low Risk  (03/13/2022)   Overall Financial Resource Strain (CARDIA)    Difficulty of Paying Living Expenses: Not hard at all  Food Insecurity: No Food Insecurity (03/13/2022)   Hunger Vital Sign    Worried About Running Out of Food in the Last Year: Never true    Ran Out of Food in the  Last Year: Never true  Transportation Needs: No Transportation Needs (03/13/2022)   PRAPARE - Administrator, Civil Service (Medical): No    Lack of Transportation (Non-Medical): No  Physical Activity: Inactive (03/13/2022)   Exercise Vital Sign    Days of Exercise per Week: 0 days    Minutes of Exercise per Session: 0 min  Stress: No Stress Concern Present (03/13/2022)   [employer] of Occupational Health - Occupational Stress Questionnaire    Feeling of Stress : Not at all  Social Connections: Moderately Integrated (03/13/2022)   Social Connection and Isolation Panel [NHANES]    Frequency of Communication with Friends and Family: More than three times a week  Frequency of Social Gatherings with Friends and Family: More than three times a week    Attends Religious Services: More than 4 times per year    Active Member of Clubs or Organizations: Yes    Attends Music therapist: More than 4 times per year    Marital Status: Divorced    Tobacco Counseling Counseling given: Not Answered   Clinical Intake:  Pre-visit preparation completed: No  Pain : No/denies pain     BMI - recorded: 24.9 Nutritional Status: BMI of 19-24  Normal Nutritional Risks: None Diabetes: No  How often do you need to have someone help you when you read instructions, pamphlets, or other written materials from your doctor or pharmacy?: 1 - Never  Diabetic?  No  Interpreter Needed?: No  Information entered by :: Rolene Arbour LPN   Activities of Daily Living    03/13/2022    4:00 PM  In your present state of health, do you have any difficulty performing the following activities:  Hearing? 0  Vision? 0  Difficulty concentrating or making decisions? 0  Walking or climbing stairs? 0  Dressing or bathing? 0  Doing errands, shopping? 0  Preparing Food and eating ? N  Using the Toilet? N  In the past six months, have you accidently leaked urine? N  Do you have problems  with loss of bowel control? 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 Wears breifs  Managing your Medications? N  Managing your Finances? N  Housekeeping or managing your Housekeeping? N    Patient Care Team: Billie Ruddy, MD as PCP - General (Family Medicine) Annia Belt, MD as Consulting Physician (Oncology) Deboraha Sprang, MD as Consulting Physician (Cardiology)  Indicate any recent Medical Services you may have received from other than [facility] providers in the past year (date may be approximate).     Assessment:   This is a routine wellness examination for [facility]  Hearing/Vision screen Hearing Screening - Comments:: Denies hearing difficulties   Vision Screening - Comments:: Wears rx glasses - up to date with routine eye exams with  [place] issues and exercise activities discussed: Exercise limited by: None identified   Goals Addressed               This Visit's Progress     Patient Stated (pt-stated)        Exercise more.       Depression Screen    03/13/2022    3:58 PM 10/05/2021   11:04 AM 11/02/2020    1:17 PM 04/01/2018    9:11 AM  PHQ 2/9 Scores  PHQ - 2 Score 0 2 1   PHQ- 9 Score  10       Information is confidential and restricted. Go to Review Flowsheets to unlock data.    Fall Risk    03/13/2022    4:03 PM 10/05/2021   11:04 AM 11/02/2020    1:23 PM 04/01/2018    9:10 AM  Fall Risk   Falls in the past year? 1 1 1    Number falls in past yr: 0 1 1   Injury with Fall? 1 1 1    Comment left knee and left hip injury. Followed by medical attention. Pending MRI  hit head and back   Risk for fall due to : Impaired balance/gait Impaired balance/gait History of fall(s);Impaired balance/gait;Impaired mobility;Impaired vision   Follow up Falls prevention discussed  Falls evaluation completed Falls prevention discussed      Information is confidential and restricted. Go to Review Flowsheets to  unlock data.    FALL RISK PREVENTION PERTAINING TO THE HOME:  Any stairs in or around the home? Yes  If so, are there any without handrails? No  Home free of loose throw rugs in walkways, pet beds, electrical cords, etc? Yes  Adequate lighting in your home to reduce risk of falls? Yes   ASSISTIVE DEVICES UTILIZED TO PREVENT FALLS:  Life alert? Yes  Use of a cane, walker or w/c? No  Grab bars in the bathroom? Yes  Shower chair or bench in shower? Yes  Elevated toilet seat or a handicapped toilet? Yes   TIMED UP AND GO:  Was the test performed? No . Audio Visit    Cognitive Function:        03/13/2022    4:06 PM 11/02/2020    1:26 PM  6CIT Screen  What Year? 0 points 0 points  What month? 0 points 0 points  What time? 0 points 0 points  Count back from 20 0 points 0 points  Months in reverse 0 points 0 points  Repeat phrase 0 points 0 points  Total Score 0 points 0 points    Immunizations Immunization History  Administered Date(s) Administered   PFIZER Comirnaty(Gray Top)Covid-19 Tri-Sucrose Vaccine 10/21/2019, 11/26/2019, 06/15/2020   PPD Test 06/27/2020    TDAP status: Due, Education has been provided regarding the importance of this vaccine. Advised may receive this vaccine at local pharmacy or Health Dept. Aware to provide a copy of the vaccination record if obtained from local pharmacy or Health Dept. Verbalized acceptance and understanding.  Flu Vaccine status: Declined, Education has been provided regarding the importance of this vaccine but patient still declined. Advised may receive this vaccine at local pharmacy or Health Dept. Aware to provide a copy of the vaccination record if obtained from local pharmacy or Health Dept. Verbalized acceptance and understanding.    Covid-19 vaccine status: Completed vaccines  Qualifies for Shingles Vaccine? Yes   Zostavax completed No   Shingrix Completed?: No.    Education has been provided regarding the importance of  this vaccine. Patient has been advised to call insurance company to determine out of pocket expense if they have not yet received this vaccine. Advised may also receive vaccine at local pharmacy or Health Dept. Verbalized acceptance and understanding.  Screening Tests Health Maintenance  Topic Date Due   Hepatitis C Screening  Never done   DTaP/Tdap/Td (1 - Tdap) Never done   PAP SMEAR-Modifier  Never done   MAMMOGRAM  Never done   COVID-19 Vaccine (4 - 2023-24 season) 03/29/2022 (Originally 11/03/2021)   INFLUENZA VACCINE  06/03/2022 (Originally 10/03/2021)   Zoster Vaccines- Shingrix (1 of 2) 06/12/2022 (Originally 11/20/1986)   Medicare Annual Wellness (AWV)  03/14/2023   COLONOSCOPY (Pts 45-28yrs Insurance coverage will need to be confirmed)  03/02/2026   HIV Screening  Completed   HPV VACCINES  Aged Out    Health Maintenance  Health Maintenance Due  Topic Date Due   Hepatitis C Screening  Never done   DTaP/Tdap/Td (1 - Tdap) Never done   PAP SMEAR-Modifier  Never done   MAMMOGRAM  Never done    Colorectal cancer screening: Type of screening: Colonoscopy. Completed 03/02/16. Repeat every 10 years  Mammogram status: Ordered 03/13/22. Pt provided with contact info and advised to call to schedule appt.     Lung  Cancer Screening: (Low Dose CT Chest recommended if Age 17-80 years, 30 pack-year currently smoking OR have quit w/in 15years.) does not qualify.     Additional Screening:  Hepatitis C Screening: does qualify; Completed Deferred  Vision Screening: Recommended annual ophthalmology exams for early detection of glaucoma and other disorders of the eye. Is the patient up to date with their annual eye exam?  Yes  Who is the provider or what is the name of the office in which the patient attends annual eye exams? [facility]. If pt is not established with a provider, would they like to be referred to a provider to establish care? No .   Dental Screening: Recommended  annual dental exams for proper oral hygiene  Community Resource Referral / Chronic Care Management:  CRR required this visit?  No   CCM required this visit?  No      Plan:     I have personally reviewed and noted the following in the patient's chart:   Medical and social history Use of alcohol, tobacco or illicit drugs  Current medications and supplements including opioid prescriptions. Patient is not currently taking opioid prescriptions. Functional ability and status Nutritional status Physical activity Advanced directives List of other physicians Hospitalizations, surgeries, and ER visits in previous 12 months Vitals Screenings to include cognitive, depression, and falls Referrals and appointments  In addition, I have reviewed and discussed with patient certain preventive protocols, quality metrics, and best practice recommendations. A written personalized care plan for preventive services as well as general preventive health recommendations were provided to patient.     Jennifer Rung, LPN   7/78/2423   Nurse Notes: Patient due Hep-C Screening

## 2022-03-13 NOTE — Progress Notes (Signed)
Per Dr Caryl Comes, [place] lab order placed.

## 2022-03-13 NOTE — Patient Instructions (Addendum)
Jennifer [name] , Thank you for taking time to come for your Medicare Wellness Visit. I appreciate your ongoing commitment to your health goals. Please review the following plan we discussed and let me know if I can assist you in the future.   These are the goals we discussed:  Goals       Patient Stated (pt-stated)      Exercise more.        This is a list of the screening recommended for you and due dates:  Health Maintenance  Topic Date Due   Hepatitis C Screening: USPSTF Recommendation to screen - Ages 66-79 yo.  Never done   DTaP/Tdap/Td vaccine (1 - Tdap) Never done   Pap Smear  Never done   Mammogram  Never done   COVID-19 Vaccine (4 - 2023-24 season) 03/29/2022*   Flu Shot  06/03/2022*   Zoster (Shingles) Vaccine (1 of 2) 06/12/2022*   Medicare Annual Wellness Visit  03/14/2023   Colon Cancer Screening  03/02/2026   HIV Screening  Completed   HPV Vaccine  Aged Out  *Topic was postponed. The date shown is not the original due date.    Advanced directives: Please bring a copy of your health care power of attorney and living will to the office to be added to your chart at your convenience.   Conditions/risks identified: None  Next appointment: Follow up in one year for your annual wellness visit.    Preventive Care 40-64 Years, Female Preventive care refers to lifestyle choices and visits with your health care provider that can promote health and wellness. What does preventive care include? A yearly physical exam. This is also called an annual well check. Dental exams once or twice a year. Routine eye exams. Ask your health care provider how often you should have your eyes checked. Personal lifestyle choices, including: Daily care of your teeth and gums. Regular physical activity. Eating a healthy diet. Avoiding tobacco and drug use. Limiting alcohol use. Practicing safe sex. Taking low-dose aspirin daily starting at age 53. Taking vitamin and mineral supplements as  recommended by your health care provider. What happens during an annual well check? The services and screenings done by your health care provider during your annual well check will depend on your age, overall health, lifestyle risk factors, and family history of disease. Counseling  Your health care provider may ask you questions about your: Alcohol use. Tobacco use. Drug use. Emotional well-being. Home and relationship well-being. Sexual activity. Eating habits. Work and work Astronomer. Method of birth control. Menstrual cycle. Pregnancy history. Screening  You may have the following tests or measurements: Height, weight, and BMI. Blood pressure. Lipid and cholesterol levels. These may be checked every 5 years, or more frequently if you are over 78 years old. Skin check. Lung cancer screening. You may have this screening every year starting at age 8 if you have a 30-pack-year history of smoking and currently smoke or have quit within the past 15 years. Fecal occult blood test (FOBT) of the stool. You may have this test every year starting at age 53. Flexible sigmoidoscopy or colonoscopy. You may have a sigmoidoscopy every 5 years or a colonoscopy every 10 years starting at age 28. Hepatitis C blood test. Hepatitis B blood test. Sexually transmitted disease (STD) testing. Diabetes screening. This is done by checking your blood sugar (glucose) after you have not eaten for a while (fasting). You may have this done every 1-3 years. Mammogram. This may be  done every 1-2 years. Talk to your health care provider about when you should start having regular mammograms. This may depend on whether you have a family history of breast cancer. BRCA-related cancer screening. This may be done if you have a family history of breast, ovarian, tubal, or peritoneal cancers. Pelvic exam and Pap test. This may be done every 3 years starting at age 65. Starting at age 67, this may be done every 5 years if  you have a Pap test in combination with an HPV test. Bone density scan. This is done to screen for osteoporosis. You may have this scan if you are at high risk for osteoporosis. Discuss your test results, treatment options, and if necessary, the need for more tests with your health care provider. Vaccines  Your health care provider may recommend certain vaccines, such as: Influenza vaccine. This is recommended every year. Tetanus, diphtheria, and acellular pertussis (Tdap, Td) vaccine. You may need a Td booster every 10 years. Zoster vaccine. You may need this after age 55. Pneumococcal 13-valent conjugate (PCV13) vaccine. You may need this if you have certain conditions and were not previously vaccinated. Pneumococcal polysaccharide (PPSV23) vaccine. You may need one or two doses if you smoke cigarettes or if you have certain conditions. Talk to your health care provider about which screenings and vaccines you need and how often you need them. This information is not intended to replace advice given to you by your health care provider. Make sure you discuss any questions you have with your health care provider. Document Released: 03/18/2015 Document Revised: 11/09/2015 Document Reviewed: 12/21/2014 Elsevier Interactive Patient Education  2017 [place] Prevention in the Home Falls can cause injuries. They can happen to people of all ages. There are many things you can do to make your home safe and to help prevent falls. What can I do on the outside of my home? Regularly fix the edges of walkways and driveways and fix any cracks. Remove anything that might make you trip as you walk through a door, such as a raised step or threshold. Trim any bushes or trees on the path to your home. Use bright outdoor lighting. Clear any walking paths of anything that might make someone trip, such as rocks or tools. Regularly check to see if handrails are loose or broken. Make sure that both  sides of any steps have handrails. Any raised decks and porches should have guardrails on the edges. Have any leaves, snow, or ice cleared regularly. Use sand or salt on walking paths during winter. Clean up any spills in your garage right away. This includes oil or grease spills. What can I do in the bathroom? Use night lights. Install grab bars by the toilet and in the tub and shower. Do not use towel bars as grab bars. Use non-skid mats or decals in the tub or shower. If you need to sit down in the shower, use a plastic, non-slip stool. Keep the floor dry. Clean up any water that spills on the floor as soon as it happens. Remove soap buildup in the tub or shower regularly. Attach bath mats securely with double-sided non-slip rug tape. Do not have throw rugs and other things on the floor that can make you trip. What can I do in the bedroom? Use night lights. Make sure that you have a light by your bed that is easy to reach. Do not use any sheets or blankets that are too big for  your bed. They should not hang down onto the floor. Have a firm chair that has side arms. You can use this for support while you get dressed. Do not have throw rugs and other things on the floor that can make you trip. What can I do in the kitchen? Clean up any spills right away. Avoid walking on wet floors. Keep items that you use a lot in easy-to-reach places. If you need to reach something above you, use a strong step stool that has a grab bar. Keep electrical cords out of the way. Do not use floor polish or wax that makes floors slippery. If you must use wax, use non-skid floor wax. Do not have throw rugs and other things on the floor that can make you trip. What can I do with my stairs? Do not leave any items on the stairs. Make sure that there are handrails on both sides of the stairs and use them. Fix handrails that are broken or loose. Make sure that handrails are as long as the stairways. Check any  carpeting to make sure that it is firmly attached to the stairs. Fix any carpet that is loose or worn. Avoid having throw rugs at the top or bottom of the stairs. If you do have throw rugs, attach them to the floor with carpet tape. Make sure that you have a light switch at the top of the stairs and the bottom of the stairs. If you do not have them, ask someone to add them for you. What else can I do to help prevent falls? Wear shoes that: Do not have high heels. Have rubber bottoms. Are comfortable and fit you well. Are closed at the toe. Do not wear sandals. If you use a stepladder: Make sure that it is fully opened. Do not climb a closed stepladder. Make sure that both sides of the stepladder are locked into place. Ask someone to hold it for you, if possible. Clearly mark and make sure that you can see: Any grab bars or handrails. First and last steps. Where the edge of each step is. Use tools that help you move around (mobility aids) if they are needed. These include: Canes. Walkers. Scooters. Crutches. Turn on the lights when you go into a dark area. Replace any light bulbs as soon as they burn out. Set up your furniture so you have a clear path. Avoid moving your furniture around. If any of your floors are uneven, fix them. If there are any pets around you, be aware of where they are. Review your medicines with your doctor. Some medicines can make you feel dizzy. This can increase your chance of falling. Ask your doctor what other things that you can do to help prevent falls. This information is not intended to replace advice given to you by your health care provider. Make sure you discuss any questions you have with your health care provider. Document Released: 12/16/2008 Document Revised: 07/28/2015 Document Reviewed: 03/26/2014 Elsevier Interactive Patient Education  2017 [employer].

## 2022-03-15 ENCOUNTER — Other Ambulatory Visit: Payer: Medicare Other

## 2022-03-16 ENCOUNTER — Telehealth: Payer: Self-pay | Admitting: Hematology

## 2022-03-16 ENCOUNTER — Ambulatory Visit: Payer: Medicare Other | Attending: Internal Medicine

## 2022-03-16 DIAGNOSIS — I471 Supraventricular tachycardia, unspecified: Secondary | ICD-10-CM | POA: Diagnosis not present

## 2022-03-16 DIAGNOSIS — G901 Familial dysautonomia [Riley-Day]: Secondary | ICD-10-CM

## 2022-03-16 DIAGNOSIS — G90A Postural orthostatic tachycardia syndrome (POTS): Secondary | ICD-10-CM

## 2022-03-16 DIAGNOSIS — I951 Orthostatic hypotension: Secondary | ICD-10-CM

## 2022-03-16 NOTE — Telephone Encounter (Signed)
Patient called to r/s new heme appointment. Let patient know that she will be called on Monday to r/s missed appointments. Patient notified and aware to expect call.

## 2022-03-17 LAB — CORTISOL: Cortisol: 8.9 ug/dL (ref 6.2–19.4)

## 2022-03-22 ENCOUNTER — Ambulatory Visit: Payer: Medicare Other | Admitting: Internal Medicine

## 2022-03-26 ENCOUNTER — Ambulatory Visit: Payer: Medicare Other | Admitting: Internal Medicine

## 2022-03-26 ENCOUNTER — Telehealth: Payer: Self-pay

## 2022-03-26 ENCOUNTER — Ambulatory Visit: Payer: Medicare Other

## 2022-03-26 NOTE — Telephone Encounter (Signed)
I called the patient to get her ready for her telephone visit and the patient stated that she received a message that her appointment had been changed to a different date.  The patient was unable to locate the message. I asked the patient was she able to do the appointment today and she said that she was unable to do the appointment today.  The patient was told that someone in scheduling would contact her to reschedule.

## 2022-04-02 ENCOUNTER — Ambulatory Visit: Payer: Medicare Other | Admitting: Orthopaedic Surgery

## 2022-04-03 ENCOUNTER — Telehealth (INDEPENDENT_AMBULATORY_CARE_PROVIDER_SITE_OTHER): Payer: Medicare Other | Admitting: Family Medicine

## 2022-04-03 ENCOUNTER — Telehealth: Payer: Self-pay | Admitting: Family Medicine

## 2022-04-03 ENCOUNTER — Encounter: Payer: Self-pay | Admitting: Family Medicine

## 2022-04-03 VITALS — BP 85/70 | HR 134 | Temp 98.0°F | Wt 158.0 lb

## 2022-04-03 DIAGNOSIS — U071 COVID-19: Secondary | ICD-10-CM | POA: Diagnosis not present

## 2022-04-03 MED ORDER — HYDROCOD POLI-CHLORPHE POLI ER 10-8 MG/5ML PO SUER: 5.0000 mL | Freq: Two times a day (BID) | ORAL | 0 refills | Status: DC | PRN

## 2022-04-03 NOTE — Telephone Encounter (Signed)
Error

## 2022-04-03 NOTE — Progress Notes (Signed)
   Acute Office Visit  Subjective:     Patient ID: Jennifer [name], female    DOB: June 08, 1965, 57 y.o.   MRN: [id number]  Chief Complaint  Patient presents with   Covid Positive    X2 days ago via home test, states her son tested positive last week   Headache    X3 days, tried Tylenol and Motrin with some relief   Cough    Non-productive x3 days, tried Delsym   Generalized Body Aches    X4 days   Fatigue   Nausea   I connected with  Everlean Cherry Snooks on 04/03/22 by a video enabled telemedicine application and verified that I am speaking with the correct person using two identifiers.   I discussed the limitations of evaluation and management by telemedicine. The patient expressed understanding and agreed to proceed.   Patient location: home address Provider location: [place] [facility] office   Headache  Associated symptoms include coughing.  Cough Associated symptoms include headaches.   Patient is in today for positive COVID test at home. States that symptoms started about 4 days ago. States that she is on enbrel for her autoimmune disease. Then 2 days ago she developed a migraine, nasal congestion, nausea, sore throat, lots of drainage and coughing. Has been taking delsum DM  Review of Systems  Respiratory:  Positive for cough.   Neurological:  Positive for headaches.        Objective:    BP (!) 85/70   Pulse (!) 134   Temp 98 F (36.7 C)   Wt 158 lb (71.7 kg)   BMI 24.75 kg/m    Physical Exam Vitals reviewed.  Constitutional:      Appearance: She is well-developed and normal weight.  Pulmonary:     Effort: Pulmonary effort is normal.  Neurological:     Mental Status: She is alert and oriented to person, place, and time.  Psychiatric:        Mood and Affect: Mood normal.     No results found for any visits on 04/03/22.      Assessment & Plan:   Problem List Items Addressed This Visit   None Visit Diagnoses     COVID-19    -  Primary   Relevant  Medications   chlorpheniramine-HYDROcodone (TUSSIONEX) 10-8 MG/5ML     We discussed possibly starting Paxlovid, however patient is concerned about possibly interacting with her medications. Patient states that she would only like symptomatic treatment, will call in cough medication listed below- pt reports she has taken this in the past and it has been effective. Pt advised to stay in isolation until her symptoms are improved, at least 5 days. Then she is to wear a mask after coming out of isolation.   Meds ordered this encounter  Medications   chlorpheniramine-HYDROcodone (TUSSIONEX) 10-8 MG/5ML    Sig: Take 5 mLs by mouth every 12 (twelve) hours as needed for cough.    Dispense:  115 mL    Refill:  0    No follow-ups on file.  Farrel Conners, MD

## 2022-04-12 ENCOUNTER — Telehealth: Payer: Self-pay | Admitting: Hematology

## 2022-04-12 NOTE — Telephone Encounter (Signed)
R/s appt per pt request. I did let pt know this is the last time we will be able to r/s her appt due to her past no shows. She verbalized understanding. She is aware to arrive 15 mins prior to appt.

## 2022-04-26 ENCOUNTER — Telehealth: Payer: Self-pay | Admitting: Hematology and Oncology

## 2022-04-26 NOTE — Telephone Encounter (Signed)
R/s pt's new hem appt. Pt is aware of new appt time.

## 2022-05-08 ENCOUNTER — Encounter: Payer: Self-pay | Admitting: Hematology and Oncology

## 2022-05-08 ENCOUNTER — Inpatient Hospital Stay: Payer: Medicare Other | Attending: Hematology | Admitting: Hematology and Oncology

## 2022-05-08 ENCOUNTER — Inpatient Hospital Stay: Payer: Medicare Other | Admitting: Hematology

## 2022-05-08 ENCOUNTER — Inpatient Hospital Stay: Payer: Medicare Other

## 2022-05-08 DIAGNOSIS — M069 Rheumatoid arthritis, unspecified: Secondary | ICD-10-CM | POA: Diagnosis not present

## 2022-05-08 DIAGNOSIS — L819 Disorder of pigmentation, unspecified: Secondary | ICD-10-CM | POA: Diagnosis not present

## 2022-05-08 DIAGNOSIS — Z9049 Acquired absence of other specified parts of digestive tract: Secondary | ICD-10-CM | POA: Diagnosis not present

## 2022-05-08 DIAGNOSIS — M797 Fibromyalgia: Secondary | ICD-10-CM | POA: Diagnosis not present

## 2022-05-08 DIAGNOSIS — G90A Postural orthostatic tachycardia syndrome (POTS): Secondary | ICD-10-CM | POA: Insufficient documentation

## 2022-05-08 DIAGNOSIS — Z888 Allergy status to other drugs, medicaments and biological substances status: Secondary | ICD-10-CM | POA: Insufficient documentation

## 2022-05-08 DIAGNOSIS — Z885 Allergy status to narcotic agent status: Secondary | ICD-10-CM | POA: Diagnosis not present

## 2022-05-08 DIAGNOSIS — Z9851 Tubal ligation status: Secondary | ICD-10-CM | POA: Insufficient documentation

## 2022-05-08 DIAGNOSIS — Z832 Family history of diseases of the blood and blood-forming organs and certain disorders involving the immune mechanism: Secondary | ICD-10-CM | POA: Insufficient documentation

## 2022-05-08 DIAGNOSIS — G8929 Other chronic pain: Secondary | ICD-10-CM | POA: Diagnosis not present

## 2022-05-08 DIAGNOSIS — Z882 Allergy status to sulfonamides status: Secondary | ICD-10-CM | POA: Insufficient documentation

## 2022-05-08 DIAGNOSIS — R5382 Chronic fatigue, unspecified: Secondary | ICD-10-CM | POA: Diagnosis not present

## 2022-05-08 LAB — COMPREHENSIVE METABOLIC PANEL
ALT: 25 U/L (ref 0–44)
AST: 22 U/L (ref 15–41)
Albumin: 4.2 g/dL (ref 3.5–5.0)
Alkaline Phosphatase: 77 U/L (ref 38–126)
Anion gap: 4 — ABNORMAL LOW (ref 5–15)
BUN: 7 mg/dL (ref 6–20)
CO2: 30 mmol/L (ref 22–32)
Calcium: 9.3 mg/dL (ref 8.9–10.3)
Chloride: 104 mmol/L (ref 98–111)
Creatinine, Ser: 1.02 mg/dL — ABNORMAL HIGH (ref 0.44–1.00)
GFR, Estimated: >60 mL/min (ref >60)
Glucose, Bld: 87 mg/dL (ref 70–99)
Potassium: 4.9 mmol/L (ref 3.5–5.1)
Sodium: 138 mmol/L (ref 135–145)
Total Bilirubin: 0.4 mg/dL (ref 0.3–1.2)
Total Protein: 7.4 g/dL (ref 6.5–8.1)

## 2022-05-08 LAB — CBC WITH DIFFERENTIAL/PLATELET
Abs Immature Granulocytes: 0.01 10*3/uL (ref 0.00–0.07)
Basophils Absolute: 0.1 10*3/uL (ref 0.0–0.1)
Basophils Relative: 1 %
Eosinophils Absolute: 0.1 10*3/uL (ref 0.0–0.5)
Eosinophils Relative: 2 %
HCT: 40.6 % (ref 36.0–46.0)
Hemoglobin: 13.6 g/dL (ref 12.0–15.0)
Immature Granulocytes: 0 %
Lymphocytes Relative: 45 %
Lymphs Abs: 2.2 10*3/uL (ref 0.7–4.0)
MCH: 34.3 pg — ABNORMAL HIGH (ref 26.0–34.0)
MCHC: 33.5 g/dL (ref 30.0–36.0)
MCV: 102.5 fL — ABNORMAL HIGH (ref 80.0–100.0)
Monocytes Absolute: 0.5 10*3/uL (ref 0.1–1.0)
Monocytes Relative: 9 %
Neutro Abs: 2.1 10*3/uL (ref 1.7–7.7)
Neutrophils Relative %: 43 %
Platelets: 173 10*3/uL (ref 150–400)
RBC: 3.96 MIL/uL (ref 3.87–5.11)
RDW: 11.9 % (ref 11.5–15.5)
WBC: 5 10*3/uL (ref 4.0–10.5)
nRBC: 0 % (ref 0.0–0.2)

## 2022-05-08 LAB — FERRITIN: Ferritin: 167 ng/mL (ref 11–307)

## 2022-05-08 NOTE — Progress Notes (Unsigned)
[facility] NOTE  Patient Care Team: Billie Ruddy, MD as PCP - General (Family Medicine) Deboraha Sprang, MD as PCP - Electrophysiology (Cardiology) Annia Belt, MD as Consulting Physician (Oncology) Deboraha Sprang, MD as Consulting Physician (Cardiology)  CHIEF COMPLAINTS/PURPOSE OF CONSULTATION:  Hemochromatosis.  ASSESSMENT & PLAN:  No problem-specific Assessment & Plan notes found for this encounter.  No orders of the defined types were placed in this encounter.    HISTORY OF PRESENTING ILLNESS:  Jennifer [name] 57 y.o. female is here because of hemochromatosis.  REVIEW OF SYSTEMS:   Constitutional: Denies fevers, chills or abnormal night sweats Eyes: Denies blurriness of vision, double vision or watery eyes Ears, nose, mouth, throat, and face: Denies mucositis or sore throat Respiratory: Denies cough, dyspnea or wheezes Cardiovascular: Denies palpitation, chest discomfort or lower extremity swelling Gastrointestinal:  Denies nausea, heartburn or change in bowel habits Skin: Denies abnormal skin rashes Lymphatics: Denies new lymphadenopathy or easy bruising Neurological:Denies numbness, tingling or new weaknesses Behavioral/Psych: Mood is stable, no new changes  All other systems were reviewed with the patient and are negative.  MEDICAL HISTORY:  Past Medical History:  Diagnosis Date   Anxiety    Arthritis    inactive RA- no meds   ASD (atrial septal defect)    Closure    Depression    Dysrhythmia    PVCs   Headache(784.0)    migraines   Hemochromatosis    IBS (irritable bowel syndrome)    Orthostatic syncope    POTS (postural orthostatic tachycardia syndrome)    Rheumatoid arthritis(714.0) 02/04/2012    SURGICAL HISTORY: Past Surgical History:  Procedure Laterality Date   CHOLECYSTECTOMY  1989   DILATION AND CURETTAGE OF UTERUS  2004   LAPAROSCOPIC TUBAL LIGATION  10/26/2010   Procedure: LAPAROSCOPIC TUBAL LIGATION;   Surgeon: Marylynn Pearson;  Location: [facility];  Service: Gynecology;  Laterality: Bilateral;   LAPAROSCOPY     PATENT FORAMEN OVALE CLOSURE  2008    SOCIAL HISTORY: Social History   Socioeconomic History   Marital status: Divorced    Spouse name: Not on file   Number of children: Not on file   Years of education: Not on file   Highest education level: Bachelor's degree (e.g., BA, AB, BS)  Occupational History   Not on file  Tobacco Use   Smoking status: Never    Passive exposure: Past   Smokeless tobacco: Never  Vaping Use   Vaping Use: Never used  Substance and Sexual Activity   Alcohol use: Yes    Comment: occasionally.   Drug use: No   Sexual activity: Not on file  Other Topics Concern   Not on file  Social History Narrative   Not on file   Social Determinants of Health   Financial Resource Strain: Low Risk  (03/13/2022)   Overall Financial Resource Strain (CARDIA)    Difficulty of Paying Living Expenses: Not hard at all  Food Insecurity: No Food Insecurity (03/13/2022)   Hunger Vital Sign    Worried About Running Out of Food in the Last Year: Never true    Ran Out of Food in the Last Year: Never true  Transportation Needs: No Transportation Needs (03/13/2022)   PRAPARE - Hydrologist (Medical): No    Lack of Transportation (Non-Medical): No  Physical Activity: Inactive (03/13/2022)   Exercise Vital Sign    Days of Exercise per Week: 0 days  Minutes of Exercise per Session: 0 min  Stress: No Stress Concern Present (03/13/2022)   [place]    Feeling of Stress : Not at all  Social Connections: Moderately Integrated (03/13/2022)   Social Connection and Isolation Panel [NHANES]    Frequency of Communication with Friends and Family: More than three times a week    Frequency of Social Gatherings with Friends and Family: More than three times a week    Attends Religious Services:  More than 4 times per year    Active Member of [employer] or Organizations: Yes    Attends Music therapist: More than 4 times per year    Marital Status: Divorced  Intimate Partner Violence: Not At Risk (03/13/2022)   Humiliation, Afraid, Rape, and Kick questionnaire    Fear of Current or Ex-Partner: No    Emotionally Abused: No    Physically Abused: No    Sexually Abused: No    FAMILY HISTORY: Family History  Problem Relation Age of Onset   Hemochromatosis Mother    Hemochromatosis Sister     ALLERGIES:  is allergic to florinef [fludrocortisone], infliximab, leflunomide, methotrexate derivatives, reglan [metoclopramide], sulfacetamide-prednisolone, morphine and related, and sulfonamide derivatives.  MEDICATIONS:  Current Outpatient Medications  Medication Sig Dispense Refill   acetaminophen (TYLENOL) 500 MG tablet Take 1,000 mg by mouth every 6 (six) hours as needed for moderate pain.     chlorpheniramine-HYDROcodone (TUSSIONEX) 10-8 MG/5ML Take 5 mLs by mouth every 12 (twelve) hours as needed for cough. 115 mL 0   clonazePAM (KLONOPIN) 1 MG tablet Take 1 mg by mouth every 8 (eight) hours as needed for anxiety. Every 8 hours as needed     cyclobenzaprine (FLEXERIL) 10 MG tablet Take 10 mg by mouth at bedtime.     desvenlafaxine (PRISTIQ) 50 MG 24 hr tablet Take 50 mg by mouth daily.      diphenhydrAMINE (BENADRYL) 25 MG tablet Take 50 mg by mouth every 6 (six) hours as needed for itching or allergies (hives from Enbrel injection site).     ENBREL MINI 50 MG/ML SOCT Inject 15 mLs into the muscle once a week.     Ibuprofen (MOTRIN PO) Take by mouth. OTC. 2 tablets as needed     midodrine (PROAMATINE) 2.5 MG tablet Take 1-2 tablets every 4 hours as needed 90 tablet 3   Omega-3 Fatty Acids (OMEGA 3 PO) Take by mouth.     ondansetron (ZOFRAN-ODT) 8 MG disintegrating tablet DISSOLVE 1 TABLET(8 MG) INSIDE CHEEK DAILY AS NEEDED FOR NAUSEA OR VOMITING 30 tablet 3   Polyethyl  Glycol-Propyl Glycol (SYSTANE OP) Apply to eye.     propranolol (INDERAL) 20 MG tablet TAKE 1 TABLET BY MOUTH TWICE DAILY AS NEEDED FOR INCREASE HEART RATE 180 tablet 2   topiramate (TOPAMAX) 100 MG tablet Take 100 mg by mouth at bedtime.  4   Varenicline Tartrate (TYRVAYA) 0.03 MG/ACT SOLN Place into the nose.     No current facility-administered medications for this visit.     PHYSICAL EXAMINATION: ECOG PERFORMANCE STATUS: {CHL ONC ECOG [id number]  Vitals:   05/08/22 1002  BP: 95/75  Pulse: 96  Resp: 16  Temp: 98.5 F (36.9 C)  SpO2: 100%   Filed Weights   05/08/22 1002  Weight: 160 lb 6.4 oz (72.8 kg)    GENERAL:alert, no distress and comfortable SKIN: skin color, texture, turgor are normal, no rashes or significant lesions EYES: normal, conjunctiva are  pink and non-injected, sclera clear OROPHARYNX:no exudate, no erythema and lips, buccal mucosa, and tongue normal  NECK: supple, thyroid normal size, non-tender, without nodularity LYMPH:  no palpable lymphadenopathy in the cervical, axillary or inguinal LUNGS: clear to auscultation and percussion with normal breathing effort HEART: regular rate & rhythm and no murmurs and no lower extremity edema ABDOMEN:abdomen soft, non-tender and normal bowel sounds Musculoskeletal:no cyanosis of digits and no clubbing  PSYCH: alert & oriented x 3 with fluent speech NEURO: no focal motor/sensory deficits  LABORATORY DATA:  I have reviewed the data as listed Lab Results  Component Value Date   WBC 5.9 09/28/2020   HGB 12.0 09/28/2020   HCT 36.0 09/28/2020   MCV 103.5 (H) 09/28/2020   PLT 133.0 (L) 09/28/2020     Chemistry      Component Value Date/Time   NA 137 09/28/2020 1554   NA 140 03/10/2018 0837   NA 142 01/21/2013 1315   K 3.6 09/28/2020 1554   K 3.8 01/21/2013 1315   CL 107 09/28/2020 1554   CL 111 (H) 01/30/2012 1356   CO2 23 09/28/2020 1554   CO2 22 01/21/2013 1315   BUN 7 09/28/2020 1554   BUN 10  03/10/2018 0837   BUN 6.9 (L) 01/21/2013 1315   CREATININE 0.87 09/28/2020 1554   CREATININE 0.83 05/03/2014 1356   CREATININE 0.8 01/21/2013 1315      Component Value Date/Time   CALCIUM 8.5 09/28/2020 1554   CALCIUM 8.7 01/21/2013 1315   ALKPHOS 54 09/28/2020 1554   ALKPHOS 67 01/21/2013 1315   AST 15 09/28/2020 1554   AST 32 01/21/2013 1315   ALT 13 09/28/2020 1554   ALT 28 01/21/2013 1315   BILITOT 0.3 09/28/2020 1554   BILITOT <0.2 03/10/2018 0837   BILITOT 0.30 01/21/2013 1315       RADIOGRAPHIC STUDIES: I have personally reviewed the radiological images as listed and agreed with the findings in the report. No results found.  All questions were answered. The patient knows to call the clinic with any problems, questions or concerns. I spent *** minutes in the care of this patient including H and P, review of records, counseling and coordination of care.     Benay Pike, MD 05/08/2022 10:38 AM

## 2022-05-09 ENCOUNTER — Encounter: Payer: Self-pay | Admitting: Hematology and Oncology

## 2022-05-10 ENCOUNTER — Encounter: Payer: Self-pay | Admitting: Hematology and Oncology

## 2022-05-10 ENCOUNTER — Encounter: Payer: Self-pay | Admitting: Internal Medicine

## 2022-05-10 ENCOUNTER — Ambulatory Visit: Payer: Medicare Other | Attending: Cardiology | Admitting: Internal Medicine

## 2022-05-10 ENCOUNTER — Telehealth: Payer: Self-pay

## 2022-05-10 ENCOUNTER — Other Ambulatory Visit: Payer: Self-pay | Admitting: *Deleted

## 2022-05-10 DIAGNOSIS — I959 Hypotension, unspecified: Secondary | ICD-10-CM | POA: Diagnosis not present

## 2022-05-10 NOTE — Telephone Encounter (Signed)
..   Pt understands that although there may be some limitations with this type of visit, we will take all precautions to reduce any security or privacy concerns.  Pt understands that this will be treated like an in office visit and we will file with pt's insurance, and there may be a patient responsible charge related to this service. ? ?

## 2022-05-10 NOTE — Progress Notes (Signed)
Electrophysiology TeleHealth Note   Due to national recommendations of social distancing due to COVID 19, an audio/video telehealth visit is felt to be most appropriate for this patient at this time.  See MyChart message from today for the patient's consent to telehealth for [facility].   Date:  05/10/2022   ID:  Jennifer [name], DOB 04/03/1965, MRN [id number]  Location: patient's home  Provider location: [address], [place]  Evaluation Performed: Follow-up visit  PCP:  Billie Ruddy, MD  Cardiologist:    Electrophysiologist:  SK   Chief Complaint:  Low BP and dysautonomia  History of Present Illness:    Jennifer [name] is a 57 y.o. female who presents via audio/video conferencing for a telehealth visit today.  Since last being seen in our clinic  the patient reports having continued to struggle with high HR and low BP.    They worsened this summer and then significantly following the death of her father in 2022/12/12- devestating emotionally and she is "stoic" and BP and HR have been poorly tolerating   Has spent most of her time in bed or chair    She was seen   [facility] 12/2 with multiple complaints and intolerances to multiple med outlined, significant fatigue *and she recounts "no more options" More pain 2/2 rheumatoid  Nauseated w nausea --decreased appetite and decreased PO intake with decreased fluids  Wearing a compression garment feet to abdomen  Seen 10/22 while [place] was away ([place]) with complaints of blood pressures in the 50s-80s and heart rates up to the 150s.  Orthostatics in the office that day pulse 75--119 and blood pressure 96--84; symptom events were associated with PACs the overall burden of which was about 5.9% and were evenly distributed over the 24-hour period.   Describing nocturnal palpitations, interestingly, she awakens at night her heart rates are 80 or so she stands up and her heart rates are 130.  She frequently ends up waking at night  to urinate.  Syncope 12/22 in the bathroom.      Previous medications used: Clonidine ZOMBIE Midodrine-y  >> migraines &  itching  Florinef-y  SE Droxydopa-y SE  Mestinon-y SE Beta blockers-y Ivabradine-y Zofran -y Serotonin drugs-y Anticholinergics-y IV fluids -y Compression- y    Flecainide Rx had been considered by [facility]-- Now on propranolol 20 3 tid ProAmatine 2.5 mg   Fluid intake about 2.5 L Raised HOB/compression Doing Wall pilates   has CUBII but not working   Interval visit to hematology to consider phlebotomy for the privilege of donating blood Interval COVID   Most of tachycardia at night--takes inderal and responds well Low BP>> midodrine.   Past Medical History:  Diagnosis Date   Anxiety    Arthritis    inactive RA- no meds   ASD (atrial septal defect)    Closure    Depression    Dysrhythmia    PVCs   Headache(784.0)    migraines   Hemochromatosis    IBS (irritable bowel syndrome)    Orthostatic syncope    POTS (postural orthostatic tachycardia syndrome)    Rheumatoid arthritis(714.0) 02/04/2012    Past Surgical History:  Procedure Laterality Date   CHOLECYSTECTOMY  1989   DILATION AND CURETTAGE OF UTERUS  2004   LAPAROSCOPIC TUBAL LIGATION  10/26/2010   Procedure: LAPAROSCOPIC TUBAL LIGATION;  Surgeon: Marylynn Pearson;  Location: [facility];  Service: Gynecology;  Laterality: Bilateral;   LAPAROSCOPY     PATENT  FORAMEN OVALE CLOSURE  2008    Current Outpatient Medications  Medication Sig Dispense Refill   acetaminophen (TYLENOL) 500 MG tablet Take 1,000 mg by mouth every 6 (six) hours as needed for moderate pain.     chlorpheniramine-HYDROcodone (TUSSIONEX) 10-8 MG/5ML Take 5 mLs by mouth every 12 (twelve) hours as needed for cough. 115 mL 0   clonazePAM (KLONOPIN) 1 MG tablet Take 1 mg by mouth every 8 (eight) hours as needed for anxiety. Every 8 hours as needed     cyclobenzaprine (FLEXERIL) 10 MG tablet Take 10 mg by mouth at bedtime.      desvenlafaxine (PRISTIQ) 50 MG 24 hr tablet Take 50 mg by mouth daily.      diphenhydrAMINE (BENADRYL) 25 MG tablet Take 50 mg by mouth every 6 (six) hours as needed for itching or allergies (hives from Enbrel injection site).     ENBREL MINI 50 MG/ML SOCT Inject 15 mLs into the muscle once a week.     Ibuprofen (MOTRIN PO) Take by mouth. OTC. 2 tablets as needed     midodrine (PROAMATINE) 2.5 MG tablet Take 1-2 tablets every 4 hours as needed 90 tablet 3   Omega-3 Fatty Acids (OMEGA 3 PO) Take by mouth.     ondansetron (ZOFRAN-ODT) 8 MG disintegrating tablet DISSOLVE 1 TABLET(8 MG) INSIDE CHEEK DAILY AS NEEDED FOR NAUSEA OR VOMITING 30 tablet 3   Polyethyl Glycol-Propyl Glycol (SYSTANE OP) Apply to eye.     propranolol (INDERAL) 20 MG tablet TAKE 1 TABLET BY MOUTH TWICE DAILY AS NEEDED FOR INCREASE HEART RATE 180 tablet 2   Varenicline Tartrate (TYRVAYA) 0.03 MG/ACT SOLN Place into the nose.     No current facility-administered medications for this visit.    Allergies:   Florinef [fludrocortisone], Infliximab, Leflunomide, Methotrexate derivatives, Reglan [metoclopramide], Sulfacetamide-prednisolone, Morphine and related, and Sulfonamide derivatives     Exam:    Vital Signs:  BP (!) 86/60 Comment: patient provided  Pulse 100 Comment: patient provided  Ht '5\' 7"'$  (1.702 m)   BMI 25.12 kg/m      Labs/Other Tests and Data Reviewed:    Recent Labs: 05/08/2022: ALT 25; BUN 7; Creatinine, Ser 1.02; Hemoglobin 13.6; Platelets 173; Potassium 4.9; Sodium 138   Wt Readings from Last 3 Encounters:  05/08/22 160 lb 6.4 oz (72.8 kg)  04/03/22 158 lb (71.7 kg)  03/13/22 159 lb (72.1 kg)     Other studies personally reviewed: Additional studies/ records that were reviewed today include: As above       ASSESSMENT & PLAN:    Dysautonomia with hypotension and tachycardia  Rheumatoid arthritis  Depression-primary and secondary  Arachnodactyly  SVT  Ehlers-Danlos 3 Joint  hypermobility  Nocturnal tachycardia  Nocturnal tachycardia remains problematic--  will try flecainide 25 bid but to avoid complications and not knowing what Is doing what>> we will midodrine first  Also could consider midodrine daily-- then bid if tolerated and then tid at 2.5         COVID 19 screen The patient denies symptoms of COVID 19 at this time.  The importance of social distancing was discussed today.  Follow-up:  6 weeks     Current medicines are reviewed at length with the patient today.   The patient does not have concerns regarding her medicines.  The following changes were made today:  continue amitriptyline 25 hs   Draw aldosterone level Paper PMID [id number]  assoc of hypotension and hemachromatoisis    Future tests (  post COVID )   in   months  Patient Risk:  after full review of this patients clinical status, I feel that they are at moderate risk at this time.    Signed, Virl Axe, MD  05/10/2022 5:23 PM     [place] [address] [place] [address] [phone] (office) [phone] (fax)

## 2022-05-11 ENCOUNTER — Ambulatory Visit: Payer: Medicare Other

## 2022-05-14 MED ORDER — MIDODRINE HCL 2.5 MG PO TABS: 2.5000 mg | ORAL_TABLET | Freq: Every day | ORAL | 3 refills | Status: DC

## 2022-05-14 NOTE — Addendum Note (Signed)
Addended by: Bernestine Amass on: 05/14/2022 02:10 PM   Modules accepted: Orders

## 2022-05-14 NOTE — Patient Instructions (Signed)
Medication Instructions:  Your physician has recommended you make the following change in your medication:  1) START taking midodrine 2.5 mg daily *If you need a refill on your cardiac medications before your next appointment, please call your pharmacy*  Follow-Up: At [facility], you and your health needs are our priority.  As part of our continuing mission to provide you with exceptional heart care, we have created designated Provider Care Teams.  These Care Teams include your primary Cardiologist (physician) and Advanced Practice Providers (APPs -  Physician Assistants and Nurse Practitioners) who all work together to provide you with the care you need, when you need it.  Your next appointment:   6 week(s)  Provider:   Virl Axe, MD

## 2022-05-15 ENCOUNTER — Inpatient Hospital Stay: Payer: Medicare Other

## 2022-05-15 LAB — HEMOCHROMATOSIS DNA-PCR(C282Y,H63D)

## 2022-06-05 ENCOUNTER — Telehealth: Payer: Self-pay | Admitting: Hematology and Oncology

## 2022-06-05 NOTE — Telephone Encounter (Signed)
Patient called to cancel appointments on 4/9, patient will call back to reschedule.

## 2022-06-12 ENCOUNTER — Inpatient Hospital Stay: Payer: Medicare Other

## 2022-06-19 ENCOUNTER — Ambulatory Visit: Payer: Medicare Other

## 2022-07-06 ENCOUNTER — Telehealth: Payer: Self-pay | Admitting: Hematology and Oncology

## 2022-07-09 DIAGNOSIS — Q681 Congenital deformity of finger(s) and hand: Secondary | ICD-10-CM | POA: Insufficient documentation

## 2022-07-09 DIAGNOSIS — Q7962 Hypermobile Ehlers-Danlos syndrome: Secondary | ICD-10-CM | POA: Insufficient documentation

## 2022-07-10 ENCOUNTER — Inpatient Hospital Stay: Payer: Medicare Other

## 2022-07-10 ENCOUNTER — Inpatient Hospital Stay: Payer: Medicare Other | Admitting: Hematology and Oncology

## 2022-07-12 ENCOUNTER — Telehealth: Payer: Self-pay | Admitting: Internal Medicine

## 2022-07-12 ENCOUNTER — Encounter: Payer: Self-pay | Admitting: Internal Medicine

## 2022-07-12 ENCOUNTER — Ambulatory Visit: Payer: Medicare Other | Attending: Internal Medicine | Admitting: Internal Medicine

## 2022-07-12 DIAGNOSIS — I471 Supraventricular tachycardia, unspecified: Secondary | ICD-10-CM

## 2022-07-12 DIAGNOSIS — G901 Familial dysautonomia [Riley-Day]: Secondary | ICD-10-CM

## 2022-07-12 DIAGNOSIS — Q7962 Hypermobile Ehlers-Danlos syndrome: Secondary | ICD-10-CM

## 2022-07-12 DIAGNOSIS — G90A Postural orthostatic tachycardia syndrome (POTS): Secondary | ICD-10-CM

## 2022-07-12 DIAGNOSIS — I951 Orthostatic hypotension: Secondary | ICD-10-CM

## 2022-07-12 DIAGNOSIS — Q681 Congenital deformity of finger(s) and hand: Secondary | ICD-10-CM

## 2022-07-12 NOTE — Progress Notes (Deleted)
Electrophysiology TeleHealth Note   Due to national recommendations of social distancing due to COVID 19, an audio/video telehealth visit is felt to be most appropriate for this patient at this time.  See MyChart message from today for the patient's consent to telehealth for [facility].   Date:  07/12/2022   ID:  Jennifer [name], DOB [birth year], MRN [id number]  Location: patient's home  Provider location: [address] [place]  Evaluation Performed: Follow-up visit  PCP:  Deeann Saint, MD  Cardiologist:    Electrophysiologist:  SK   Chief Complaint:  Low BP and dysautonomia  History of Present Illness:    Jennifer [name] is a 57 y.o. female who presents via audio/video conferencing for a telehealth visit today.  Since last being seen in our clinic  the patient reports having continued to struggle with high HR and low BP.    They worsened this summer and then significantly following the death of her father in 12/05/2022- devestating emotionally and she is "stoic" and BP and HR have been poorly tolerating   Has spent most of her time in bed or chair    She was seen   [facility] 12/2 with multiple complaints and intolerances to multiple med outlined, significant fatigue *and she recounts "no more options" More pain 2/2 rheumatoid  Nauseated w nausea --decreased appetite and decreased PO intake with decreased fluids  Wearing a compression garment feet to abdomen  Seen 10/22 while [place] was away ([place]) with complaints of blood pressures in the 50s-80s and heart rates up to the 150s.  Orthostatics in the office that day pulse 75--119 and blood pressure 96--84; symptom events were associated with PACs the overall burden of which was about 5.9% and were evenly distributed over the 24-hour period.   Describing nocturnal palpitations, interestingly, she awakens at night her heart rates are 80 or so she stands up and her heart rates are 130.  She frequently ends up waking at night  to urinate.  Syncope 12/22 in the bathroom.      Previous medications used: Clonidine ZOMBIE Midodrine-y  >> migraines &  itching  Florinef-y  SE Droxydopa-y SE  Mestinon-y SE Beta blockers-y Ivabradine-y Zofran -y Serotonin drugs-y Anticholinergics-y IV fluids -y Compression- y    Flecainide Rx had been considered by [facility]-- Now on propranolol 20 3 tid ProAmatine 2.5 mg   Fluid intake about 2.5 L Raised HOB/compression Doing Wall pilates   has CUBII but not working   Interval visit to hematology to consider phlebotomy for the privilege of donating blood Interval COVID   Most of tachycardia at night--takes inderal and responds well Low BP>> midodrine.   Past Medical History:  Diagnosis Date   Anxiety    Arthritis    inactive RA- no meds   ASD (atrial septal defect)    Closure    Depression    Dysrhythmia    PVCs   Headache(784.0)    migraines   Hemochromatosis    IBS (irritable bowel syndrome)    Orthostatic syncope    POTS (postural orthostatic tachycardia syndrome)    Rheumatoid arthritis(714.0) 02/04/2012    Past Surgical History:  Procedure Laterality Date   CHOLECYSTECTOMY  1989   DILATION AND CURETTAGE OF UTERUS  2004   LAPAROSCOPIC TUBAL LIGATION  10/26/2010   Procedure: LAPAROSCOPIC TUBAL LIGATION;  Surgeon: Zelphia Cairo;  Location: [facility];  Service: Gynecology;  Laterality: Bilateral;   LAPAROSCOPY     PATENT  FORAMEN OVALE CLOSURE  2008    Current Outpatient Medications  Medication Sig Dispense Refill   acetaminophen (TYLENOL) 500 MG tablet Take 1,000 mg by mouth every 6 (six) hours as needed for moderate pain.     chlorpheniramine-HYDROcodone (TUSSIONEX) 10-8 MG/5ML Take 5 mLs by mouth every 12 (twelve) hours as needed for cough. 115 mL 0   clonazePAM (KLONOPIN) 1 MG tablet Take 1 mg by mouth every 8 (eight) hours as needed for anxiety. Every 8 hours as needed     cyclobenzaprine (FLEXERIL) 10 MG tablet Take 10 mg by mouth at bedtime.      desvenlafaxine (PRISTIQ) 50 MG 24 hr tablet Take 50 mg by mouth daily.      diphenhydrAMINE (BENADRYL) 25 MG tablet Take 50 mg by mouth every 6 (six) hours as needed for itching or allergies (hives from Enbrel injection site).     ENBREL MINI 50 MG/ML SOCT Inject 15 mLs into the muscle once a week.     Ibuprofen (MOTRIN PO) Take by mouth. OTC. 2 tablets as needed     midodrine (PROAMATINE) 2.5 MG tablet Take 1 tablet (2.5 mg total) by mouth daily. 90 tablet 3   Omega-3 Fatty Acids (OMEGA 3 PO) Take by mouth.     ondansetron (ZOFRAN-ODT) 8 MG disintegrating tablet DISSOLVE 1 TABLET(8 MG) INSIDE CHEEK DAILY AS NEEDED FOR NAUSEA OR VOMITING 30 tablet 3   Polyethyl Glycol-Propyl Glycol (SYSTANE OP) Apply to eye.     propranolol (INDERAL) 20 MG tablet TAKE 1 TABLET BY MOUTH TWICE DAILY AS NEEDED FOR INCREASE HEART RATE 180 tablet 2   Varenicline Tartrate (TYRVAYA) 0.03 MG/ACT SOLN Place into the nose.     No current facility-administered medications for this visit.    Allergies:   Florinef [fludrocortisone], Infliximab, Leflunomide, Methotrexate derivatives, Reglan [metoclopramide], Sulfacetamide-prednisolone, Morphine and related, and Sulfonamide derivatives     Exam:    Vital Signs:  There were no vitals taken for this visit.     Labs/Other Tests and Data Reviewed:    Recent Labs: 05/08/2022: ALT 25; BUN 7; Creatinine, Ser 1.02; Hemoglobin 13.6; Platelets 173; Potassium 4.9; Sodium 138   Wt Readings from Last 3 Encounters:  05/08/22 160 lb 6.4 oz (72.8 kg)  04/03/22 158 lb (71.7 kg)  03/13/22 159 lb (72.1 kg)     Other studies personally reviewed: Additional studies/ records that were reviewed today include: As above       ASSESSMENT & PLAN:    Dysautonomia with hypotension and tachycardia  Rheumatoid arthritis  Depression-primary and secondary  Arachnodactyly  SVT  Ehlers-Danlos 3 Joint hypermobility  Nocturnal tachycardia  Nocturnal tachycardia remains  problematic--  will try flecainide 25 bid but to avoid complications and not knowing what Is doing what>> we will midodrine first  Also could consider midodrine daily-- then bid if tolerated and then tid at 2.5         COVID 19 screen The patient denies symptoms of COVID 19 at this time.  The importance of social distancing was discussed today.  Follow-up:  6 weeks     Current medicines are reviewed at length with the patient today.   The patient does not have concerns regarding her medicines.  The following changes were made today:  continue amitriptyline 25 hs   Draw aldosterone level Paper PMID [id number]  assoc of hypotension and hemachromatoisis    Future tests ( post COVID )   in   months  Patient Risk:  after full  review of this patients clinical status, I feel that they are at moderate risk at this time.    Signed, Sherryl Manges, MD  07/12/2022 1:44 PM     [facility] [address] [place] [address] [phone] (office) [phone] (fax)

## 2022-07-12 NOTE — Telephone Encounter (Signed)
Patient called to reschedule today's tele-visit as she is sick.  Patient wants call back to reschedule.

## 2022-07-13 ENCOUNTER — Other Ambulatory Visit: Payer: Self-pay | Admitting: Internal Medicine

## 2022-07-19 ENCOUNTER — Telehealth: Payer: Self-pay | Admitting: Hematology and Oncology

## 2022-07-20 ENCOUNTER — Inpatient Hospital Stay: Payer: Medicare Other

## 2022-07-20 ENCOUNTER — Inpatient Hospital Stay: Payer: Medicare Other | Admitting: Adult Health

## 2022-07-25 ENCOUNTER — Other Ambulatory Visit: Payer: Self-pay | Admitting: Internal Medicine

## 2022-08-09 ENCOUNTER — Ambulatory Visit: Payer: Medicare Other

## 2022-08-09 DIAGNOSIS — K080 Exfoliation of teeth due to systemic causes: Secondary | ICD-10-CM | POA: Diagnosis not present

## 2022-08-13 DIAGNOSIS — K080 Exfoliation of teeth due to systemic causes: Secondary | ICD-10-CM | POA: Diagnosis not present

## 2022-08-14 ENCOUNTER — Ambulatory Visit: Payer: Medicare Other

## 2022-08-24 ENCOUNTER — Encounter: Payer: Self-pay | Admitting: Internal Medicine

## 2022-08-29 ENCOUNTER — Ambulatory Visit: Payer: Medicare Other | Attending: Cardiology | Admitting: Internal Medicine

## 2022-08-29 ENCOUNTER — Encounter: Payer: Self-pay | Admitting: Internal Medicine

## 2022-08-29 VITALS — BP 76/52 | HR 120 | Ht 67.0 in | Wt 165.0 lb

## 2022-08-29 DIAGNOSIS — R000 Tachycardia, unspecified: Secondary | ICD-10-CM

## 2022-08-29 DIAGNOSIS — G90A Postural orthostatic tachycardia syndrome (POTS): Secondary | ICD-10-CM | POA: Diagnosis not present

## 2022-08-29 DIAGNOSIS — I471 Supraventricular tachycardia, unspecified: Secondary | ICD-10-CM | POA: Diagnosis not present

## 2022-08-29 DIAGNOSIS — R55 Syncope and collapse: Secondary | ICD-10-CM

## 2022-08-29 DIAGNOSIS — R002 Palpitations: Secondary | ICD-10-CM

## 2022-08-29 DIAGNOSIS — I951 Orthostatic hypotension: Secondary | ICD-10-CM

## 2022-08-29 DIAGNOSIS — Q681 Congenital deformity of finger(s) and hand: Secondary | ICD-10-CM

## 2022-08-29 DIAGNOSIS — Q7962 Hypermobile Ehlers-Danlos syndrome: Secondary | ICD-10-CM

## 2022-08-29 DIAGNOSIS — G901 Familial dysautonomia [Riley-Day]: Secondary | ICD-10-CM | POA: Diagnosis not present

## 2022-08-29 NOTE — Patient Instructions (Addendum)
Medication Instructions:  Your physician recommends that you continue on your current medications as directed. Please refer to the Current Medication list given to you today.  *If you need a refill on your cardiac medications before your next appointment, please call your pharmacy*   Lab Work: None ordered.  If you have labs (blood work) drawn today and your tests are completely normal, you will receive your results only by: MyChart Message (if you have MyChart) OR A paper copy in the mail If you have any lab test that is abnormal or we need to change your treatment, we will call you to review the results.   Testing/Procedures: ZIO XT- Long Term Monitor Instructions  Your physician has requested you wear a ZIO patch monitor for 14 days.  This is a single patch monitor. [employer] supplies one patch monitor per enrollment. Additional stickers are not available. Please do not apply patch if you will be having a Nuclear Stress Test,  Echocardiogram, Cardiac CT, MRI, or Chest Xray during the period you would be wearing the  monitor. The patch cannot be worn during these tests. You cannot remove and re-apply the  ZIO XT patch monitor.  Your ZIO patch monitor will be mailed 3 day USPS to your address on file. It may take 3-5 days  to receive your monitor after you have been enrolled.  Once you have received your monitor, please review the enclosed instructions. Your monitor  has already been registered assigning a specific monitor serial # to you.  Billing and Patient Assistance Program Information  We have supplied [employer] with any of your insurance information on file for billing purposes. [employer] offers a sliding scale Patient Assistance Program for patients that do not have  insurance, or whose insurance does not completely cover the cost of the ZIO monitor.  You must apply for the Patient Assistance Program to qualify for this discounted rate.  To apply, please call [employer] at  [phone], select option 4, select option 2, ask to apply for  Patient Assistance Program. [employer] will ask your household income, and how many people  are in your household. They will quote your out-of-pocket cost based on that information.  [employer] will also be able to set up a 12-month, interest-free payment plan if needed.  Applying the monitor   Shave hair from upper left chest.  Hold abrader disc by orange tab. Rub abrader in 40 strokes over the upper left chest as  indicated in your monitor instructions.  Clean area with 4 enclosed alcohol pads. Let dry.  Apply patch as indicated in monitor instructions. Patch will be placed under collarbone on left  side of chest with arrow pointing upward.  Rub patch adhesive wings for 2 minutes. Remove white label marked "1". Remove the white  label marked "2". Rub patch adhesive wings for 2 additional minutes.  While looking in a mirror, press and release button in center of patch. A small green light will  flash 3-4 times. This will be your only indicator that the monitor has been turned on.  Do not shower for the first 24 hours. You may shower after the first 24 hours.  Press the button if you feel a symptom. You will hear a small click. Record Date, Time and  Symptom in the Patient Logbook.  When you are ready to remove the patch, follow instructions on the last 2 pages of Patient  Logbook. Stick patch monitor onto the last page of Patient Logbook.  Place Patient   Logbook in the blue and white box. Use locking tab on box and tape box closed  securely. The blue and white box has prepaid postage on it. Please place it in the mailbox as  soon as possible. Your physician should have your test results approximately 7 days after the  monitor has been mailed back to [facility].  Call [facility] Customer Care at [phone] if you have questions regarding  your ZIO XT patch monitor. Call them immediately if you see an orange light  blinking on your  monitor.  If your monitor falls off in less than 4 days, contact our Monitor department at [phone].  If your monitor becomes loose or falls off after 4 days call [employer] at [phone] for  suggestions on securing your monitor     Follow-Up: At [facility], you and your health needs are our priority.  As part of our continuing mission to provide you with exceptional heart care, we have created designated Provider Care Teams.  These Care Teams include your primary Cardiologist (physician) and Advanced Practice Providers (APPs -  Physician Assistants and Nurse Practitioners) who all work together to provide you with the care you need, when you need it.  We recommend signing up for the patient portal called "MyChart".  Sign up information is provided on this After Visit Summary.  MyChart is used to connect with patients for Virtual Visits (Telemedicine).  Patients are able to view lab/test results, encounter notes, upcoming appointments, etc.  Non-urgent messages can be sent to your provider as well.   To learn more about what you can do with MyChart, go to [email or web address].    Your next appointment:   6 months with Dr [name] - telephone office visit  02/13/2023 at 3:45pm  I have mailed your disability placard to your home address

## 2022-08-29 NOTE — Progress Notes (Unsigned)
Electrophysiology TeleHealth Note   Due to national recommendations of social distancing due to COVID 19, an audio/video telehealth visit is felt to be most appropriate for this patient at this time.  See MyChart message from today for the patient's consent to telehealth for [facility].   Date:  08/29/2022   ID:  Jennifer [name], DOB May 29, 1965, MRN [id number]  Location: patient's home  Provider location: [address] [place]  Evaluation Performed: Follow-up visit  PCP:  Deeann Saint, MD  Cardiologist:    Electrophysiologist:  SK   Chief Complaint:  Low BP and dysautonomia  History of Present Illness:    Jennifer [name] is a 57 y.o. female who presents via audio  conferencing for a telehealth visit today.  Since last being seen in our clinic hypotension and worsening nocturnal tachycardia for which we were going to try flecainide twice daily and midodrine, apparently again associated with worsening of migraines,  the patient reports recurrent falls with presumed BP falls Daily episodes of rapid heartbeats apparently out of nowhere and then resolving 20-30 minutes after taking propranolol.  She reminds me that she had an ablation for her SVT.  No longer on flecainide  Nausea has been a big problem.  Fibromyalgia aggravated her rheumatoid arthritis   Compression is been in the form of leggings that she is able to pull on relatively easily.  She also has a more compressive device which goes from thighs to bust line which is much harder to put on "torture "  Has spent most of her time in bed or chair Last seen at [facility] 4/23  Fluid intake about 2-3L;        Previous medications used: Clonidine ZOMBIE Midodrine-y  >> migraines &  itching  Florinef-y  SE Droxydopa-y SE  Mestinon-y SE Beta blockers-y Ivabradine-y Zofran -y Serotonin drugs-y Anticholinergics-y IV fluids -y Compression- y   Now on propranolol 20 3 tid ProAmatine 2.5 mg   Fluid  intake about 2.5 L Raised HOB/compression Doing Wall pilates         Past Medical History:  Diagnosis Date   Anxiety    Arthritis    inactive RA- no meds   ASD (atrial septal defect)    Closure    Depression    Dysrhythmia    PVCs   Headache(784.0)    migraines   Hemochromatosis    IBS (irritable bowel syndrome)    Orthostatic syncope    POTS (postural orthostatic tachycardia syndrome)    Rheumatoid arthritis(714.0) 02/04/2012    Past Surgical History:  Procedure Laterality Date   CHOLECYSTECTOMY  1989   DILATION AND CURETTAGE OF UTERUS  2004   LAPAROSCOPIC TUBAL LIGATION  10/26/2010   Procedure: LAPAROSCOPIC TUBAL LIGATION;  Surgeon: Zelphia Cairo;  Location: [facility];  Service: Gynecology;  Laterality: Bilateral;   LAPAROSCOPY     PATENT FORAMEN OVALE CLOSURE  2008    Current Outpatient Medications  Medication Sig Dispense Refill   acetaminophen (TYLENOL) 500 MG tablet Take 1,000 mg by mouth every 6 (six) hours as needed for moderate pain.     clonazePAM (KLONOPIN) 1 MG tablet Take 1 mg by mouth every 8 (eight) hours as needed for anxiety. Every 8 hours as needed     cyclobenzaprine (FLEXERIL) 10 MG tablet Take 10 mg by mouth at bedtime.     desvenlafaxine (PRISTIQ) 50 MG 24 hr tablet Take 50 mg by mouth daily. Per patient taking 25 mg  for 1 more week, then stop medicine.     diphenhydrAMINE (BENADRYL) 25 MG tablet Take 50 mg by mouth every 6 (six) hours as needed for itching or allergies (hives from Enbrel injection site).     ENBREL MINI 50 MG/ML SOCT Inject 15 mLs into the muscle once a week.     Ibuprofen (MOTRIN PO) Take by mouth. OTC. 2 tablets as needed     midodrine (PROAMATINE) 2.5 MG tablet Take 1 tablet (2.5 mg total) by mouth daily. 90 tablet 3   Omega-3 Fatty Acids (OMEGA 3 PO) Take by mouth.     ondansetron (ZOFRAN-ODT) 8 MG disintegrating tablet DISSOLVE 1 TABLET(8 MG) INSIDE CHEEK DAILY AS NEEDED FOR NAUSEA OR VOMITING 90 tablet 0   Polyethyl  Glycol-Propyl Glycol (SYSTANE OP) Apply to eye.     propranolol (INDERAL) 20 MG tablet TAKE 1 TABLET BY MOUTH TWICE DAILY AS NEEDED. INCREASE HEART RATE 180 tablet 2   Varenicline Tartrate (TYRVAYA) 0.03 MG/ACT SOLN Place into the nose.     No current facility-administered medications for this visit.    Allergies:   Florinef [fludrocortisone], Infliximab, Leflunomide, Methotrexate derivatives, Reglan [metoclopramide], Sulfacetamide-prednisolone, Morphine and codeine, and Sulfonamide derivatives     Exam:    Vital Signs:  BP (!) 76/52   Pulse (!) 120   Ht 5\' 7"  (1.702 m)   Wt 165 lb (74.8 kg)   BMI 25.84 kg/m      Labs/Other Tests and Data Reviewed:    Recent Labs: 05/08/2022: ALT 25; BUN 7; Creatinine, Ser 1.02; Hemoglobin 13.6; Platelets 173; Potassium 4.9; Sodium 138   Wt Readings from Last 3 Encounters:  08/29/22 165 lb (74.8 kg)  05/08/22 160 lb 6.4 oz (72.8 kg)  04/03/22 158 lb (71.7 kg)     Other studies personally reviewed: Additional studies/ records that were reviewed today include: As above       ASSESSMENT & PLAN:    Dysautonomia with hypotension and tachycardia  Rheumatoid arthritis  Depression-primary and secondary  Arachnodactyly  SVT--ablation  Ehlers-Danlos 3 Joint hypermobility  Nocturnal tachycardia  1 increase sodium to 3-4 gms Discussed salt substitutes with a  goal of 3-4 gms of Sodium or 12-15 gm of sodium Chloride daily.  Rehydration solutions include Liquid IV, NUUN, TriOral, Normralyte pedialyte advanced care and Banana Bags.  Salt tablets include plain salt tablets, SaltStick Vitassium, thermatabs amongst others.   The higher sodium concentration per serving on this list is normalyte about 800 mg of sodium per serving LMNT1000 mg and   about Within You hydration formula also at 1000 mg  Event recorder to sort out tachypalps  Discussed again re compression  Handicapped sticker       Follow-up:  35m     Current medicines are  reviewed at length with the patient today.   The patient had  concerns regarding her medicines.  The following changes were made today:  stop  midodrine  Labs/ tests ordered today include: event recorder  No orders of the defined types were placed in this encounter.   Future tests ( post COVID )   Today, I have spent 35 minutes with the patient with telehealth technology discussing the above.   Current medicines are reviewed at length with the patient today.   The patient does not have concerns regarding her medicines.  The following changes were made today:  continue amitriptyline 25 hs   Draw aldosterone level Paper PMID [id number]  assoc of hypotension and hemachromatoisis  Future tests ( post COVID )   in   months  Patient Risk:  after full review of this patients clinical status, I feel that they are at moderate risk at this time.    Signed, Sherryl Manges, MD  08/29/2022 6:01 PM     [facility] [address] [place] [address] [phone] (office) [phone] (fax)

## 2022-08-30 ENCOUNTER — Encounter: Payer: Self-pay | Admitting: Internal Medicine

## 2022-08-30 ENCOUNTER — Ambulatory Visit: Payer: Medicare Other | Attending: Internal Medicine

## 2022-08-30 DIAGNOSIS — H02882 Meibomian gland dysfunction right lower eyelid: Secondary | ICD-10-CM | POA: Diagnosis not present

## 2022-08-30 DIAGNOSIS — H02885 Meibomian gland dysfunction left lower eyelid: Secondary | ICD-10-CM | POA: Diagnosis not present

## 2022-08-30 DIAGNOSIS — R002 Palpitations: Secondary | ICD-10-CM

## 2022-08-30 DIAGNOSIS — H04123 Dry eye syndrome of bilateral lacrimal glands: Secondary | ICD-10-CM | POA: Diagnosis not present

## 2022-08-30 DIAGNOSIS — R000 Tachycardia, unspecified: Secondary | ICD-10-CM

## 2022-08-30 DIAGNOSIS — H524 Presbyopia: Secondary | ICD-10-CM | POA: Diagnosis not present

## 2022-08-30 DIAGNOSIS — H2513 Age-related nuclear cataract, bilateral: Secondary | ICD-10-CM | POA: Diagnosis not present

## 2022-08-30 NOTE — Progress Notes (Unsigned)
Enrolled patient for a 7 day Zio XT monitor to be mailed to patients home.  

## 2022-09-03 DIAGNOSIS — R002 Palpitations: Secondary | ICD-10-CM | POA: Diagnosis not present

## 2022-09-03 DIAGNOSIS — R000 Tachycardia, unspecified: Secondary | ICD-10-CM | POA: Diagnosis not present

## 2022-09-04 DIAGNOSIS — H524 Presbyopia: Secondary | ICD-10-CM | POA: Diagnosis not present

## 2022-09-14 DIAGNOSIS — R002 Palpitations: Secondary | ICD-10-CM | POA: Diagnosis not present

## 2022-09-14 DIAGNOSIS — R000 Tachycardia, unspecified: Secondary | ICD-10-CM | POA: Diagnosis not present

## 2022-09-27 NOTE — Telephone Encounter (Signed)
Is hse trying to take salt as a tablet?  If so thermatabs may be better tolerated as it is buffered.  Or is the problem even in liquid form Thanks SK

## 2022-10-09 ENCOUNTER — Encounter: Payer: Self-pay | Admitting: Internal Medicine

## 2022-10-16 ENCOUNTER — Other Ambulatory Visit: Payer: Self-pay | Admitting: Internal Medicine

## 2022-10-17 NOTE — Telephone Encounter (Signed)
Pt's pharmacy is requesting a refill on ondansetron. Would Dr. [name] like to refill this non cardiac medication? Please address

## 2022-10-25 DIAGNOSIS — K080 Exfoliation of teeth due to systemic causes: Secondary | ICD-10-CM | POA: Diagnosis not present

## 2022-10-29 DIAGNOSIS — Q796 Ehlers-Danlos syndrome, unspecified: Secondary | ICD-10-CM | POA: Diagnosis not present

## 2022-10-29 DIAGNOSIS — G90A Postural orthostatic tachycardia syndrome (POTS): Secondary | ICD-10-CM | POA: Diagnosis not present

## 2022-10-29 DIAGNOSIS — R5383 Other fatigue: Secondary | ICD-10-CM | POA: Diagnosis not present

## 2022-10-29 DIAGNOSIS — M0579 Rheumatoid arthritis with rheumatoid factor of multiple sites without organ or systems involvement: Secondary | ICD-10-CM | POA: Diagnosis not present

## 2022-10-29 DIAGNOSIS — M797 Fibromyalgia: Secondary | ICD-10-CM | POA: Diagnosis not present

## 2022-11-17 ENCOUNTER — Encounter: Payer: Self-pay | Admitting: Internal Medicine

## 2022-11-20 DIAGNOSIS — R5383 Other fatigue: Secondary | ICD-10-CM | POA: Diagnosis not present

## 2022-11-20 DIAGNOSIS — I4711 Inappropriate sinus tachycardia, so stated: Secondary | ICD-10-CM | POA: Diagnosis not present

## 2022-11-20 DIAGNOSIS — R419 Unspecified symptoms and signs involving cognitive functions and awareness: Secondary | ICD-10-CM | POA: Diagnosis not present

## 2022-11-20 DIAGNOSIS — Q7962 Hypermobile Ehlers-Danlos syndrome: Secondary | ICD-10-CM | POA: Diagnosis not present

## 2022-11-20 DIAGNOSIS — R000 Tachycardia, unspecified: Secondary | ICD-10-CM | POA: Diagnosis not present

## 2022-11-20 DIAGNOSIS — I950 Idiopathic hypotension: Secondary | ICD-10-CM | POA: Diagnosis not present

## 2022-11-20 DIAGNOSIS — I471 Supraventricular tachycardia, unspecified: Secondary | ICD-10-CM | POA: Diagnosis not present

## 2023-02-13 ENCOUNTER — Ambulatory Visit: Payer: Medicare Other | Admitting: Internal Medicine

## 2023-02-13 ENCOUNTER — Encounter: Payer: Self-pay | Admitting: Internal Medicine

## 2023-02-18 ENCOUNTER — Other Ambulatory Visit: Payer: Self-pay | Admitting: Internal Medicine

## 2023-02-19 NOTE — Telephone Encounter (Signed)
Pt's pharmacy is requesting a refill on ondansetron. Would Dr. [name] like to refill this non cardiac medication? Please address

## 2023-03-07 DIAGNOSIS — R5383 Other fatigue: Secondary | ICD-10-CM | POA: Diagnosis not present

## 2023-03-07 DIAGNOSIS — I7300 Raynaud's syndrome without gangrene: Secondary | ICD-10-CM | POA: Diagnosis not present

## 2023-03-07 DIAGNOSIS — I471 Supraventricular tachycardia, unspecified: Secondary | ICD-10-CM | POA: Diagnosis not present

## 2023-03-07 DIAGNOSIS — G90A Postural orthostatic tachycardia syndrome (POTS): Secondary | ICD-10-CM | POA: Diagnosis not present

## 2023-03-08 ENCOUNTER — Ambulatory Visit: Payer: Medicare Other | Admitting: Internal Medicine

## 2023-03-18 ENCOUNTER — Encounter: Payer: Self-pay | Admitting: Family Medicine

## 2023-03-22 ENCOUNTER — Telehealth: Payer: Self-pay

## 2023-03-22 NOTE — Telephone Encounter (Signed)
Unsuccessful attempts to reach patient on preferred number listed in notes for scheduled AWV. Left message on voicemail okay to rscheduled.

## 2023-04-01 ENCOUNTER — Ambulatory Visit: Payer: Medicare Other | Admitting: Internal Medicine

## 2023-04-01 ENCOUNTER — Encounter: Payer: Self-pay | Admitting: Internal Medicine

## 2023-04-01 DIAGNOSIS — G90A Postural orthostatic tachycardia syndrome (POTS): Secondary | ICD-10-CM

## 2023-04-16 ENCOUNTER — Ambulatory Visit: Payer: Medicare Other | Admitting: Audiologist

## 2023-04-19 DIAGNOSIS — Z79899 Other long term (current) drug therapy: Secondary | ICD-10-CM | POA: Diagnosis not present

## 2023-04-19 DIAGNOSIS — M0579 Rheumatoid arthritis with rheumatoid factor of multiple sites without organ or systems involvement: Secondary | ICD-10-CM | POA: Diagnosis not present

## 2023-04-22 ENCOUNTER — Other Ambulatory Visit: Payer: Self-pay | Admitting: Internal Medicine

## 2023-04-23 ENCOUNTER — Encounter: Payer: Self-pay | Admitting: Internal Medicine

## 2023-04-24 ENCOUNTER — Telehealth: Payer: Self-pay

## 2023-04-24 ENCOUNTER — Ambulatory Visit: Payer: Medicare Other | Attending: Internal Medicine | Admitting: Internal Medicine

## 2023-04-24 MED ORDER — ONDANSETRON 8 MG PO TBDP: ORAL_TABLET | ORAL | 1 refills | Status: DC

## 2023-04-24 NOTE — Progress Notes (Unsigned)
Electrophysiology TeleHealth Note   Due to national recommendations of social distancing due to COVID 19, an audio/video telehealth visit is felt to be most appropriate for this patient at this time.  See MyChart message from today for the patient's consent to telehealth for [facility].   Date:  04/24/2023   ID:  Jennifer [name], DOB June 09, 1965, MRN [id number]  Location: patient's home  Provider location: [address], [place]  Evaluation Performed: Follow-up visit  PCP:  Sigmund Hazel, MD  Cardiologist:    Electrophysiologist:  SK   Chief Complaint:  Low BP and dysautonomia  History of Present Illness:    Jennifer [name] is a 58 y.o. female who presents via audio  conferencing for a telehealth visit today.  Since last being seen in our clinic hypotension and worsening nocturnal tachycardia for which we were going to try flecainide twice daily and midodrine, apparently again associated with worsening of migraines,  the patient reports recurrent falls with presumed BP falls Daily episodes of rapid heartbeats apparently out of nowhere and then resolving 20-30 minutes after taking propranolol.  She reminds me that she had an ablation for her SVT.  No longer on flecainide  Nausea has been a big problem.  Fibromyalgia aggravated her rheumatoid arthritis   Compression is been in the form of leggings that she is able to pull on relatively easily.  She also has a more compressive device which goes from thighs to bust line which is much harder to put on "torture "  Has spent most of her time in bed or chair Last seen at [facility] 4/23  Fluid intake about 2-3L;        Previous medications used: Clonidine ZOMBIE Midodrine-y  >> migraines &  itching  Florinef-y  SE Droxydopa-y SE  Mestinon-y SE Beta blockers-y Ivabradine-y Zofran -y Serotonin drugs-y Anticholinergics-y IV fluids -y Compression- y   Now on propranolol 20 3 tid ProAmatine 2.5 mg   Fluid  intake about 2.5 L Raised HOB/compression Doing Wall pilates         Past Medical History:  Diagnosis Date   Anxiety    Arthritis    inactive RA- no meds   ASD (atrial septal defect)    Closure    Depression    Dysrhythmia    PVCs   Headache(784.0)    migraines   Hemochromatosis    IBS (irritable bowel syndrome)    Orthostatic syncope    POTS (postural orthostatic tachycardia syndrome)    Rheumatoid arthritis(714.0) 02/04/2012    Past Surgical History:  Procedure Laterality Date   CHOLECYSTECTOMY  1989   DILATION AND CURETTAGE OF UTERUS  2004   LAPAROSCOPIC TUBAL LIGATION  10/26/2010   Procedure: LAPAROSCOPIC TUBAL LIGATION;  Surgeon: Zelphia Cairo;  Location: [facility];  Service: Gynecology;  Laterality: Bilateral;   LAPAROSCOPY     PATENT FORAMEN OVALE CLOSURE  2008    Current Outpatient Medications  Medication Sig Dispense Refill   acetaminophen (TYLENOL) 500 MG tablet Take 1,000 mg by mouth every 6 (six) hours as needed for moderate pain.     clonazePAM (KLONOPIN) 1 MG tablet Take 1 mg by mouth every 8 (eight) hours as needed for anxiety. Every 8 hours as needed     cyclobenzaprine (FLEXERIL) 10 MG tablet Take 10 mg by mouth at bedtime.     diphenhydrAMINE (BENADRYL) 25 MG tablet Take 50 mg by mouth every 6 (six) hours as needed for itching or  allergies (hives from Enbrel injection site).     ENBREL MINI 50 MG/ML SOCT Inject 15 mLs into the muscle once a week.     Ibuprofen (MOTRIN PO) Take by mouth. OTC. 2 tablets as needed     Omega-3 Fatty Acids (OMEGA 3 PO) Take by mouth.     ondansetron (ZOFRAN-ODT) 8 MG disintegrating tablet DISSOLVE 1 TABLET(8 MG) INSIDE CHEEK DAILY AS NEEDED FOR NAUSEA OR VOMITING 30 tablet 1   Polyethyl Glycol-Propyl Glycol (SYSTANE OP) Apply to eye.     propranolol (INDERAL) 20 MG tablet TAKE 1 TABLET BY MOUTH TWICE DAILY AS NEEDED. INCREASE HEART RATE 180 tablet 2   Varenicline Tartrate (TYRVAYA) 0.03 MG/ACT SOLN Place into the nose.      No current facility-administered medications for this visit.    Allergies:   Florinef [fludrocortisone], Infliximab, Leflunomide, Methotrexate derivatives, Reglan [metoclopramide], Sulfacetamide-prednisolone, Morphine and codeine, and Sulfonamide derivatives     Exam:    Vital Signs:  Ht 5\' 7"  (1.702 m)   Wt 157 lb (71.2 kg)   BMI 24.59 kg/m      Labs/Other Tests and Data Reviewed:    Recent Labs: 05/08/2022: ALT 25; BUN 7; Creatinine, Ser 1.02; Hemoglobin 13.6; Platelets 173; Potassium 4.9; Sodium 138   Wt Readings from Last 3 Encounters:  04/24/23 157 lb (71.2 kg)  08/29/22 165 lb (74.8 kg)  05/08/22 160 lb 6.4 oz (72.8 kg)     Other studies personally reviewed: Additional studies/ records that were reviewed today include: As above       ASSESSMENT & PLAN:    Dysautonomia with hypotension and tachycardia  Rheumatoid arthritis  Depression-primary and secondary  Arachnodactyly  SVT--ablation  Ehlers-Danlos 3 Joint hypermobility  Nocturnal tachycardia    ***       Follow-up:  ***    Current medicines are reviewed at length with the patient today.   The patient {ACTIONS; HAS/DOES NOT HAVE:19233} concerns regarding her medicines.  The following changes were made today:  {NONE DEFAULTED:18576}  Labs/ tests ordered today include: *** No orders of the defined types were placed in this encounter.     Today, I have spent *** minutes with the patient with telehealth technology discussing the above.  Signed, Sherryl Manges, MD  04/24/2023 12:15 PM     [facility] [address] [place] [address] [phone] (office) [phone] (fax)     Future tests ( post COVID )   in   months  Patient Risk:  after full review of this patients clinical status, I feel that they are at moderate risk at this time.    Signed, Sherryl Manges, MD  04/24/2023 12:15 PM     [facility] [address] [place] [address] [phone] (office) [phone] (fax)

## 2023-04-24 NOTE — Telephone Encounter (Signed)
 ..  Pt understands that although there may be some limitations with this type of visit, we will take all precautions to reduce any security or privacy concerns.  Pt understands that this will be treated like an in office visit and we will file with pt's insurance, and there may be a patient responsible charge related to this service. ? ?

## 2023-05-09 ENCOUNTER — Ambulatory Visit: Payer: Medicare Other | Admitting: Audiology

## 2023-06-03 ENCOUNTER — Ambulatory Visit: Payer: Medicare Other | Admitting: Internal Medicine

## 2023-06-11 ENCOUNTER — Encounter: Payer: Self-pay | Admitting: Hematology and Oncology

## 2023-06-11 ENCOUNTER — Ambulatory Visit: Admitting: Physician Assistant

## 2023-06-11 ENCOUNTER — Encounter: Payer: Self-pay | Admitting: Physician Assistant

## 2023-06-11 ENCOUNTER — Other Ambulatory Visit (INDEPENDENT_AMBULATORY_CARE_PROVIDER_SITE_OTHER): Payer: Self-pay

## 2023-06-11 ENCOUNTER — Other Ambulatory Visit: Payer: Self-pay

## 2023-06-11 DIAGNOSIS — G8929 Other chronic pain: Secondary | ICD-10-CM

## 2023-06-11 DIAGNOSIS — M25511 Pain in right shoulder: Secondary | ICD-10-CM | POA: Diagnosis not present

## 2023-06-11 DIAGNOSIS — M25512 Pain in left shoulder: Secondary | ICD-10-CM

## 2023-06-11 MED ORDER — METHYLPREDNISOLONE ACETATE 40 MG/ML IJ SUSP
40.0000 mg | INTRAMUSCULAR | Status: AC | PRN
  Administered 2023-06-11: 40 mg via INTRA_ARTICULAR

## 2023-06-11 MED ORDER — MELOXICAM 15 MG PO TABS: 15.0000 mg | ORAL_TABLET | Freq: Every day | ORAL | 0 refills | Status: DC

## 2023-06-11 NOTE — Progress Notes (Signed)
 Office Visit Note   Patient: Jennifer [name]           Date of Birth: Jul 16, 1965           MRN: [id number] Visit Date: 06/11/2023              Requested by: Sigmund Hazel, MD [address] [place],  [place] [address] PCP: Sigmund Hazel, MD   Assessment & Plan: Visit Diagnoses:  1. Chronic right shoulder pain   2. Chronic left shoulder pain     Plan: Patient is a 58 year old woman with a complicated medical history including Ehlers-Danlos syndrome, rheumatoid arthritis, and fibromyalgia.  She has got about a 3-week onset of bilateral shoulder pain.  No injury or change in activity.  No paresthesias or neck pain.  Her neck range of motion was good today she had good forward elevation did not have a drop arm test either side but did have significant pain bilaterally.  Also with internal rotation behind her back.  Her strength was limited more by pain than anything else.  She has good motion and no rib contusion of symptoms when moving her neck.  She has tried ibuprofen we could try doing Mobic instead.  She has difficulty getting to physical therapy so I will give her some exercises to try and around although she cannot take oral steroids she has tolerated injectable steroids in the past would like to try this as well we also talked about alternative things like CBD oil  Follow-Up Instructions: No follow-ups on file.   Orders:  Orders Placed This Encounter  Procedures  . XR Shoulder Right  . XR Shoulder Left   No orders of the defined types were placed in this encounter.     Procedures: Large Joint Inj: bilateral subacromial bursa on 06/11/2023 11:25 AM Indications: diagnostic evaluation and pain Details: 25 G 1.5 in needle, posterior approach  Arthrogram: No  Medications (Right): 40 mg methylPREDNISolone acetate 40 MG/ML Medications (Left): 40 mg methylPREDNISolone acetate 40 MG/ML Outcome: tolerated well, no immediate complications Procedure, treatment alternatives, risks and  benefits explained, specific risks discussed. Consent was given by the patient.      Clinical Data: No additional findings.   Subjective: Chief Complaint  Patient presents with  . Left Shoulder - Pain  . Right Shoulder - Pain    HPIPatient is a 58 year old woman presents with bilateral shoulder pain. No injury Has a a history of fibromyalgia and rheumatoid arthritis.  She has difficulty raising her arms above her head and cannot reach behind her back.  She been taking ibuprofen    Review of Systems  All other systems reviewed and are negative.    Objective: Vital Signs: There were no vitals taken for this visit.  Physical Exam Pulmonary:     Effort: Pulmonary effort is normal.  Skin:    General: Skin is warm and dry.  Neurological:     General: No focal deficit present.     Mental Status: She is alert and oriented to person, place, and time.  Psychiatric:        Mood and Affect: Mood normal.        Behavior: Behavior normal.    Ortho Exam Bilateral shoulders she has active elevation with little assisted passive elevation to almost 180 degrees she has abduction is limited by pain but can resist abduction.  Has no pain with external rotation.  No apprehension findings.  No deformity.  Distal sensation is  intact.  Pain with empty can test Specialty Comments:  No specialty comments available.  Imaging: No results found.   PMFS History: Patient Active Problem List   Diagnosis Date Noted  . Arachnodactyly 07/09/2022  . Ehlers-Danlos syndrome type III 07/09/2022  . Other hemochromatosis 05/08/2022  . Seropositive rheumatoid arthritis of multiple sites (HCC) 12/29/2015  . SVT (supraventricular tachycardia) (HCC) 11/25/2015  . Suicide attempt by beta blocker overdose (HCC) 08/11/2013  . POTS (postural orthostatic tachycardia syndrome) 08/11/2013  . Fibromyalgia 10/19/2012  . Restless legs syndrome (RLS) 10/19/2012  . Insomnia 10/19/2012  . Orthostatic  syncope/hypotension 10/17/2012  . Major depression, recurrent (HCC) 07/22/2012  . GAD (generalized anxiety disorder) 07/22/2012  . Panic attacks 07/22/2012  . Dysautonomia (HCC) 04/07/2009  . HEMOCHROMATOSIS, HX OF 04/07/2009  . Irritable bowel syndrome 09/22/2007  . NAUSEA 09/22/2007  . ABDOMINAL PAIN, LEFT LOWER QUADRANT 09/22/2007  . MIGRAINES, HX OF 09/22/2007   Past Medical History:  Diagnosis Date  . Anxiety   . Arthritis    inactive RA- no meds  . ASD (atrial septal defect)    Closure   . Depression   . Dysrhythmia    PVCs  . Headache(784.0)    migraines  . Hemochromatosis   . IBS (irritable bowel syndrome)   . Orthostatic syncope   . POTS (postural orthostatic tachycardia syndrome)   . Rheumatoid arthritis(714.0) 02/04/2012    Family History  Problem Relation Age of Onset  . Hemochromatosis Mother   . Hemochromatosis Sister     Past Surgical History:  Procedure Laterality Date  . CHOLECYSTECTOMY  1989  . DILATION AND CURETTAGE OF UTERUS  2004  . LAPAROSCOPIC TUBAL LIGATION  10/26/2010   Procedure: LAPAROSCOPIC TUBAL LIGATION;  Surgeon: Zelphia Cairo;  Location: [facility];  Service: Gynecology;  Laterality: Bilateral;  . LAPAROSCOPY    . PATENT FORAMEN OVALE CLOSURE  2008   Social History   Occupational History  . Not on file  Tobacco Use  . Smoking status: Never    Passive exposure: Past  . Smokeless tobacco: Never  Vaping Use  . Vaping status: Never Used  Substance and Sexual Activity  . Alcohol use: Yes    Comment: occasionally.  . Drug use: No  . Sexual activity: Not on file

## 2023-06-13 ENCOUNTER — Ambulatory Visit: Admitting: Audiology

## 2023-06-20 DIAGNOSIS — F411 Generalized anxiety disorder: Secondary | ICD-10-CM | POA: Diagnosis not present

## 2023-06-20 DIAGNOSIS — F4010 Social phobia, unspecified: Secondary | ICD-10-CM | POA: Diagnosis not present

## 2023-06-20 DIAGNOSIS — F4312 Post-traumatic stress disorder, chronic: Secondary | ICD-10-CM | POA: Diagnosis not present

## 2023-06-20 DIAGNOSIS — F3341 Major depressive disorder, recurrent, in partial remission: Secondary | ICD-10-CM | POA: Diagnosis not present

## 2023-06-24 ENCOUNTER — Ambulatory Visit: Attending: Internal Medicine | Admitting: Internal Medicine

## 2023-06-24 ENCOUNTER — Encounter: Payer: Self-pay | Admitting: Internal Medicine

## 2023-06-24 VITALS — BP 98/75 | HR 100 | Temp 97.8°F | Ht 67.0 in | Wt 152.0 lb

## 2023-06-24 DIAGNOSIS — M79642 Pain in left hand: Secondary | ICD-10-CM | POA: Diagnosis not present

## 2023-06-24 DIAGNOSIS — G90A Postural orthostatic tachycardia syndrome (POTS): Secondary | ICD-10-CM | POA: Diagnosis not present

## 2023-06-24 DIAGNOSIS — M797 Fibromyalgia: Secondary | ICD-10-CM | POA: Diagnosis not present

## 2023-06-24 DIAGNOSIS — Q796 Ehlers-Danlos syndrome, unspecified: Secondary | ICD-10-CM | POA: Diagnosis not present

## 2023-06-24 DIAGNOSIS — M0579 Rheumatoid arthritis with rheumatoid factor of multiple sites without organ or systems involvement: Secondary | ICD-10-CM | POA: Diagnosis not present

## 2023-06-24 DIAGNOSIS — M79641 Pain in right hand: Secondary | ICD-10-CM | POA: Diagnosis not present

## 2023-06-24 NOTE — Patient Instructions (Signed)
 Medication Instructions:  Your physician recommends that you continue on your current medications as directed. Please refer to the Current Medication list given to you today.  *If you need a refill on your cardiac medications before your next appointment, please call your pharmacy*  Lab Work: None ordered.  If you have labs (blood work) drawn today and your tests are completely normal, you will receive your results only by: MyChart Message (if you have MyChart) OR A paper copy in the mail If you have any lab test that is abnormal or we need to change your treatment, we will call you to review the results.  Testing/Procedures: None ordered.   Follow-Up: At [facility], you and your health needs are our priority.  As part of our continuing mission to provide you with exceptional heart care, our providers are all part of one team.  This team includes your primary Cardiologist (physician) and Advanced Practice Providers or APPs (Physician Assistants and Nurse Practitioners) who all work together to provide you with the care you need, when you need it.  Your next appointment:   4 months with Dr Jennifer [name]      1st Floor: - Lobby - Registration  - Pharmacy  - Lab - Cafe  2nd Floor: - PV Lab - Diagnostic Testing (echo, CT, nuclear med)  3rd Floor: - Vacant  4th Floor: - TCTS (cardiothoracic surgery) - AFib Clinic - Structural Heart Clinic - Vascular Surgery  - Vascular Ultrasound  5th Floor: - HeartCare Cardiology (general and EP) - Clinical Pharmacy for coumadin, hypertension, lipid, weight-loss medications, and med management appointments    Valet parking services will be available as well.

## 2023-06-24 NOTE — Progress Notes (Unsigned)
 Electrophysiology TeleHealth Note      Date:  06/24/2023   ID:  Jennifer [name], DOB [birth year], MRN [id number]  Location: patient's home  Provider location: [address] [place]  Evaluation Performed: Follow-up visit  PCP:  Jennifer Bradley, MD  Cardiologist:    Electrophysiologist:  SK   Chief Complaint:  POTS  History of Present Illness:   My location is [facility]. The Patients location is their home . Jennifer [name] is a 58 y.o. female who presents via audio conferencing for a telehealth visit today.  Since last being seen in our clinic for hypotension and worsening nocturnal tachycardia .  She was last seen at [facility] 9/24 and there were discussions about SVT to be had.  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   Rheumatoid arthritis flares>> "good as it gets">>  EMBREL; hydroxychloroquine and meloxicam ;  Orthopedics >> shoulder steroid shots.  Event recorder>>many triggered events were with sinus, occ w PACs,    BP low a  few weeks ago treating with salt and water Semaglutide ( ozempic) for weight loss 178--150 lbs  [place]>> reveiwed with EP and chose to do nothing; including not back on flecainide, Rx nadolol, propranolol  works better     Past Medical History:  Diagnosis Date   Anxiety    Arthritis    inactive RA- no meds   ASD (atrial septal defect)    Closure    Depression    Dysrhythmia    PVCs   Headache(784.0)    migraines   Hemochromatosis    IBS (irritable bowel syndrome)    Orthostatic syncope    POTS (postural orthostatic tachycardia syndrome)    Rheumatoid arthritis(714.0) 02/04/2012    Past Surgical History:  Procedure Laterality Date   CHOLECYSTECTOMY  1989   DILATION AND CURETTAGE OF UTERUS  2004   LAPAROSCOPIC TUBAL LIGATION   10/26/2010   Procedure: LAPAROSCOPIC TUBAL LIGATION;  Surgeon: Ashby Lawman;  Location: [facility];  Service: Gynecology;  Laterality: Bilateral;   LAPAROSCOPY     PATENT FORAMEN OVALE CLOSURE  2008    Current Outpatient Medications  Medication Sig Dispense Refill   acetaminophen  (TYLENOL ) 500 MG tablet Take 1,000 mg by mouth every 6 (six) hours as needed for moderate pain.     clonazePAM  (KLONOPIN ) 1 MG tablet Take 0.5 mg by mouth every 8 (eight) hours as needed for anxiety. Every 8 hours as needed     cyclobenzaprine  (FLEXERIL ) 10 MG tablet Take 10 mg by mouth at bedtime.     diphenhydrAMINE  (BENADRYL ) 25 MG tablet Take 50 mg by mouth every 6 (six) hours as needed for itching or allergies (hives from Enbrel  injection site).     ENBREL  MINI 50 MG/ML SOCT Inject 15 mLs into the muscle once a week.     FLUoxetine (PROZAC) 40 MG capsule Take 80 mg by mouth daily.     Ibuprofen  (MOTRIN  PO) Take by mouth. OTC. 2 tablets as needed     meloxicam  (MOBIC ) 15 MG tablet Take 1 tablet (15 mg total) by mouth daily. 30 tablet 0   Omega-3 Fatty Acids (OMEGA 3 PO) Take by mouth.     ondansetron  (ZOFRAN -ODT) 8 MG disintegrating tablet DISSOLVE 1 TABLET(8 MG) INSIDE CHEEK DAILY AS NEEDED FOR NAUSEA OR VOMITING 30 tablet  1   Polyethyl Glycol-Propyl Glycol (SYSTANE OP) Apply to eye.     propranolol  (INDERAL ) 20 MG tablet TAKE 1 TABLET BY MOUTH TWICE DAILY AS NEEDED. INCREASE HEART RATE 180 tablet 2   Varenicline Tartrate (TYRVAYA) 0.03 MG/ACT SOLN Place into the nose.     No current facility-administered medications for this visit.    Allergies:   Florinef  [fludrocortisone ], Infliximab, Leflunomide, Methotrexate derivatives, Reglan  [metoclopramide ], Sulfacetamide-prednisolone, Morphine and codeine, and Sulfonamide derivatives   ROS:  Please see the history of present illness.   All other systems are personally reviewed and negative.    Exam:    Vital Signs:  BP 98/75 Comment: patient provided  Pulse 100  Comment: patient provided  Temp 97.8 F (36.6 C) Comment: patient provided  Ht 5\' 7"  (1.702 m) Comment: patient provided  Wt 152 lb (68.9 kg) Comment: patient provided  BMI 23.81 kg/m    Labs/Other Tests and Data Reviewed:    Recent Labs: No results found for requested labs within last 365 days.   Wt Readings from Last 3 Encounters:  06/24/23 152 lb (68.9 kg)  04/24/23 157 lb (71.2 kg)  08/29/22 165 lb (74.8 kg)     Other studies personally reviewed: Additional studies/ records that were reviewed today include:     ASSESSMENT & PLAN:    Dysautonomia with hypotension and tachycardia   Rheumatoid arthritis   Depression-primary and secondary   Arachnodactyly   SVT   Ehlers-Danlos 3 Joint hypermobility   Nocturnal tachycardia  Continue current regime  Will review arrhythmia issues prior to her appt with [facility] in 9/25    Follow-up:  aug 25    Current medicines are reviewed at length with the patient today.   The patient  concerns regarding her medicines.  The following changes were made today:    Labs/ tests ordered today include:  No orders of the defined types were placed in this encounter.     Today, I have spent 35 minutes with the patient with telehealth technology discussing the above.  Signed, Richardo Chandler, MD  06/24/2023 5:43 PM     [facility] [address] [place] [address] [phone] (office) [phone] (fax)

## 2023-06-25 ENCOUNTER — Other Ambulatory Visit: Payer: Self-pay | Admitting: Internal Medicine

## 2023-07-09 ENCOUNTER — Other Ambulatory Visit: Payer: Self-pay | Admitting: Physician Assistant

## 2023-08-05 DIAGNOSIS — M0579 Rheumatoid arthritis with rheumatoid factor of multiple sites without organ or systems involvement: Secondary | ICD-10-CM | POA: Diagnosis not present

## 2023-08-20 ENCOUNTER — Telehealth: Payer: Self-pay

## 2023-08-20 NOTE — Telephone Encounter (Signed)
 Verbally confirmed appts for 6/19

## 2023-08-21 ENCOUNTER — Other Ambulatory Visit: Payer: Self-pay | Admitting: *Deleted

## 2023-08-21 DIAGNOSIS — Z862 Personal history of diseases of the blood and blood-forming organs and certain disorders involving the immune mechanism: Secondary | ICD-10-CM

## 2023-08-22 ENCOUNTER — Inpatient Hospital Stay: Attending: Hematology and Oncology | Admitting: Hematology and Oncology

## 2023-08-22 ENCOUNTER — Inpatient Hospital Stay

## 2023-08-22 VITALS — BP 114/63 | HR 92 | Temp 98.1°F | Resp 16 | Ht 67.0 in | Wt 162.9 lb

## 2023-08-22 DIAGNOSIS — Z882 Allergy status to sulfonamides status: Secondary | ICD-10-CM | POA: Insufficient documentation

## 2023-08-22 DIAGNOSIS — Q796 Ehlers-Danlos syndrome, unspecified: Secondary | ICD-10-CM | POA: Diagnosis not present

## 2023-08-22 DIAGNOSIS — M069 Rheumatoid arthritis, unspecified: Secondary | ICD-10-CM | POA: Insufficient documentation

## 2023-08-22 DIAGNOSIS — Z885 Allergy status to narcotic agent status: Secondary | ICD-10-CM | POA: Diagnosis not present

## 2023-08-22 DIAGNOSIS — F419 Anxiety disorder, unspecified: Secondary | ICD-10-CM | POA: Diagnosis not present

## 2023-08-22 DIAGNOSIS — Z79899 Other long term (current) drug therapy: Secondary | ICD-10-CM | POA: Diagnosis not present

## 2023-08-22 DIAGNOSIS — Z888 Allergy status to other drugs, medicaments and biological substances status: Secondary | ICD-10-CM | POA: Diagnosis not present

## 2023-08-22 DIAGNOSIS — Z862 Personal history of diseases of the blood and blood-forming organs and certain disorders involving the immune mechanism: Secondary | ICD-10-CM

## 2023-08-22 DIAGNOSIS — Z9049 Acquired absence of other specified parts of digestive tract: Secondary | ICD-10-CM | POA: Insufficient documentation

## 2023-08-22 LAB — CBC WITH DIFFERENTIAL (CANCER CENTER ONLY)
Abs Immature Granulocytes: 0.01 10*3/uL (ref 0.00–0.07)
Basophils Absolute: 0 10*3/uL (ref 0.0–0.1)
Basophils Relative: 1 %
Eosinophils Absolute: 0.1 10*3/uL (ref 0.0–0.5)
Eosinophils Relative: 1 %
HCT: 38.3 % (ref 36.0–46.0)
Hemoglobin: 12.9 g/dL (ref 12.0–15.0)
Immature Granulocytes: 0 %
Lymphocytes Relative: 27 %
Lymphs Abs: 1.6 10*3/uL (ref 0.7–4.0)
MCH: 34.3 pg — ABNORMAL HIGH (ref 26.0–34.0)
MCHC: 33.7 g/dL (ref 30.0–36.0)
MCV: 101.9 fL — ABNORMAL HIGH (ref 80.0–100.0)
Monocytes Absolute: 0.5 10*3/uL (ref 0.1–1.0)
Monocytes Relative: 9 %
Neutro Abs: 3.6 10*3/uL (ref 1.7–7.7)
Neutrophils Relative %: 62 %
Platelet Count: 219 10*3/uL (ref 150–400)
RBC: 3.76 MIL/uL — ABNORMAL LOW (ref 3.87–5.11)
RDW: 12.2 % (ref 11.5–15.5)
WBC Count: 5.8 10*3/uL (ref 4.0–10.5)
nRBC: 0 % (ref 0.0–0.2)

## 2023-08-22 LAB — CMP (CANCER CENTER ONLY)
ALT: 30 U/L (ref 0–44)
AST: 22 U/L (ref 15–41)
Albumin: 4 g/dL (ref 3.5–5.0)
Alkaline Phosphatase: 72 U/L (ref 38–126)
Anion gap: 5 (ref 5–15)
BUN: 9 mg/dL (ref 6–20)
CO2: 30 mmol/L (ref 22–32)
Calcium: 9.3 mg/dL (ref 8.9–10.3)
Chloride: 103 mmol/L (ref 98–111)
Creatinine: 0.83 mg/dL (ref 0.44–1.00)
GFR, Estimated: >60 mL/min (ref >60)
Glucose, Bld: 95 mg/dL (ref 70–99)
Potassium: 4.3 mmol/L (ref 3.5–5.1)
Sodium: 138 mmol/L (ref 135–145)
Total Bilirubin: 0.4 mg/dL (ref 0.0–1.2)
Total Protein: 7 g/dL (ref 6.5–8.1)

## 2023-08-22 LAB — FERRITIN: Ferritin: 309 ng/mL — ABNORMAL HIGH (ref 11–307)

## 2023-08-22 MED ORDER — FLUOXETINE HCL 20 MG PO CAPS: 60.0000 mg | ORAL_CAPSULE | Freq: Every day | ORAL | 0 refills | Status: DC

## 2023-08-22 NOTE — Progress Notes (Signed)
 [facility] CONSULT NOTE  Patient Care Team: Perley Bradley, MD as PCP - General (Family Medicine) Verona Goodwill, MD as PCP - Electrophysiology (Cardiology) Scotty Cyphers, MD as Consulting Physician (Oncology) Verona Goodwill, MD as Consulting Physician (Cardiology)  CHIEF COMPLAINTS/PURPOSE OF CONSULTATION:  Hemochromatosis.  ASSESSMENT & PLAN:   This is a very pleasant 58 yr old female with a known diagnosis of hemochromatosis previously followed by Dr Isidor Marek, no showed and missed several appts with Dr Salomon Cree hence scheduled with me.  Assessment and Plan Assessment & Plan Hemochromatosis Ferritin level likely increased due to lack of phlebotomy and cessation of menstruation. Enbrel  may affect blood donation eligibility. - Schedule medical phlebotomy next week. - Perform labs the day before phlebotomy to check ferritin levels for future phlebotomies, no need to repeat labs for next week's phlebotomy. - Cancel phlebotomy if ferritin is under 100. - Repeat phlebotomy every three months. - Avoid iron supplements.  FU as scheduled.     HISTORY OF PRESENTING ILLNESS:  Jowanda Heeg Walpole 58 y.o. female is here because of hemochromatosis.  This is a very pleasant 58 yr old female patient with previous history of hemochromatosis, followed by Dr Isidor Marek re establishing with hematology.  Discussed the use of AI scribe software for clinical note transcription with the patient, who gave verbal consent to proceed.  History of Present Illness Camauri Fleece Minkler is a 57 year old female with hemochromatosis who presents for follow-up regarding her ferritin levels and management of her condition.  She has not undergone any phlebotomy or blood donation in over a year. Her last recorded ferritin level was 167 ng/mL. She is concerned about her ability to donate blood due to her use of Enbrel , a biologic she has been on for over ten years for rheumatoid arthritis. Her  previous doctor advised against blood donation because of her use of Enbrel  and her diagnosis of hemochromatosis.  She is currently taking Klonopin  at a dose of 0.25 mg, which she achieves by splitting a 0.5 mg tablet. She also takes fluoxetine at a dose of 60 mg daily, using 20 mg capsules. She has discontinued meloxicam , which was used short-term. She does not take any iron supplements but does take vitamin D and B12.  Her family history is significant for hemochromatosis, with several family members affected. Her uncle died from complications related to hemochromatosis, including cirrhosis and liver failure, which led to a liver transplant.  She is postmenopausal, having not menstruated for about five years. She drinks approximately 70 ounces of water daily.  No issues with bowel movements or urination. She has dark circles under her eyes, which she attributes to Ehlers-Danlos syndrome.   MEDICAL HISTORY:  Past Medical History:  Diagnosis Date   Anxiety    Arthritis    inactive RA- no meds   ASD (atrial septal defect)    Closure    Depression    Dysrhythmia    PVCs   Headache(784.0)    migraines   Hemochromatosis    IBS (irritable bowel syndrome)    Orthostatic syncope    POTS (postural orthostatic tachycardia syndrome)    Rheumatoid arthritis(714.0) 02/04/2012    SURGICAL HISTORY: Past Surgical History:  Procedure Laterality Date   CHOLECYSTECTOMY  1989   DILATION AND CURETTAGE OF UTERUS  2004   LAPAROSCOPIC TUBAL LIGATION  10/26/2010   Procedure: LAPAROSCOPIC TUBAL LIGATION;  Surgeon: Ashby Lawman;  Location: [facility];  Service: Gynecology;  Laterality: Bilateral;   LAPAROSCOPY  PATENT FORAMEN OVALE CLOSURE  2008    SOCIAL HISTORY: Social History   Socioeconomic History   Marital status: Divorced    Spouse name: Not on file   Number of children: Not on file   Years of education: Not on file   Highest education level: Bachelor's degree (e.g., BA, AB, BS)   Occupational History   Not on file  Tobacco Use   Smoking status: Never    Passive exposure: Past   Smokeless tobacco: Never  Vaping Use   Vaping status: Never Used  Substance and Sexual Activity   Alcohol  use: Yes    Comment: occasionally.   Drug use: No   Sexual activity: Not on file  Other Topics Concern   Not on file  Social History Narrative   Not on file   Social Drivers of Health   Financial Resource Strain: Low Risk  (03/13/2022)   Overall Financial Resource Strain (CARDIA)    Difficulty of Paying Living Expenses: Not hard at all  Food Insecurity: No Food Insecurity (03/13/2022)   Hunger Vital Sign    Worried About Running Out of Food in the Last Year: Never true    Ran Out of Food in the Last Year: Never true  Transportation Needs: No Transportation Needs (03/13/2022)   PRAPARE - Administrator, Civil Service (Medical): No    Lack of Transportation (Non-Medical): No  Physical Activity: Inactive (03/13/2022)   Exercise Vital Sign    Days of Exercise per Week: 0 days    Minutes of Exercise per Session: 0 min  Stress: No Stress Concern Present (03/13/2022)   [employer] of Occupational Health - Occupational Stress Questionnaire    Feeling of Stress : Not at all  Social Connections: Moderately Integrated (03/13/2022)   Social Connection and Isolation Panel    Frequency of Communication with Friends and Family: More than three times a week    Frequency of Social Gatherings with Friends and Family: More than three times a week    Attends Religious Services: More than 4 times per year    Active Member of [employer] or Organizations: Yes    Attends Engineer, structural: More than 4 times per year    Marital Status: Divorced  Intimate Partner Violence: Low Risk  (11/20/2022)   Received from [facility]   [facility] Historical Interpersonal Safety    Does anyone neglect, hurt, or threaten the patient?: No    FAMILY HISTORY: Family  History  Problem Relation Age of Onset   Hemochromatosis Mother    Hemochromatosis Sister     ALLERGIES:  is allergic to florinef  [fludrocortisone ], infliximab, leflunomide, methotrexate derivatives, reglan  [metoclopramide ], sulfacetamide-prednisolone, morphine and codeine, and sulfonamide derivatives.  MEDICATIONS:  Current Outpatient Medications  Medication Sig Dispense Refill   FLUoxetine (PROZAC) 20 MG capsule Take 3 capsules (60 mg total) by mouth daily. 30 capsule 0   acetaminophen  (TYLENOL ) 500 MG tablet Take 1,000 mg by mouth every 6 (six) hours as needed for moderate pain.     clonazePAM  (KLONOPIN ) 1 MG tablet Take 0.5 mg by mouth every 8 (eight) hours as needed for anxiety. Every 8 hours as needed     cyclobenzaprine  (FLEXERIL ) 10 MG tablet Take 10 mg by mouth at bedtime.     diphenhydrAMINE  (BENADRYL ) 25 MG tablet Take 50 mg by mouth every 6 (six) hours as needed for itching or allergies (hives from Enbrel  injection site).     ENBREL  MINI 50 MG/ML SOCT  Inject 15 mLs into the muscle once a week.     Ibuprofen  (MOTRIN  PO) Take by mouth. OTC. 2 tablets as needed     Omega-3 Fatty Acids (OMEGA 3 PO) Take by mouth.     ondansetron  (ZOFRAN -ODT) 8 MG disintegrating tablet DISSOLVE 1 TABLET(8 MG) INSIDE CHEEK DAILY AS NEEDED FOR NAUSEA OR VOMITING 30 tablet 5   Polyethyl Glycol-Propyl Glycol (SYSTANE OP) Apply to eye.     propranolol  (INDERAL ) 20 MG tablet TAKE 1 TABLET BY MOUTH TWICE DAILY AS NEEDED. INCREASE HEART RATE 180 tablet 2   Varenicline Tartrate (TYRVAYA) 0.03 MG/ACT SOLN Place into the nose.     No current facility-administered medications for this visit.     PHYSICAL EXAMINATION: ECOG PERFORMANCE STATUS: 0 - Asymptomatic  Vitals:   08/22/23 1309  BP: 114/63  Pulse: 92  Resp: 16  Temp: 98.1 F (36.7 C)  SpO2: 96%   Filed Weights   08/22/23 1309  Weight: 162 lb 14.4 oz (73.9 kg)    GENERAL:alert, no distress and comfortable SKIN: skin color, texture, turgor  are normal, no rashes or significant lesions EYES: normal, conjunctiva are pink and non-injected, sclera clear OROPHARYNX:no exudate, no erythema and lips, buccal mucosa, and tongue normal  NECK: supple, thyroid  normal size, non-tender, without nodularity LYMPH:  no palpable lymphadenopathy in the cervical, axillary  LUNGS: clear to auscultation and percussion with normal breathing effort HEART: regular rate & rhythm and no murmurs and no lower extremity edema ABDOMEN:abdomen soft, non-tender and normal bowel sounds Musculoskeletal:no cyanosis of digits and no clubbing  PSYCH: alert & oriented x 3 with fluent speech NEURO: no focal motor/sensory deficits  LABORATORY DATA:  I have reviewed the data as listed Lab Results  Component Value Date   WBC 5.8 08/22/2023   HGB 12.9 08/22/2023   HCT 38.3 08/22/2023   MCV 101.9 (H) 08/22/2023   PLT 219 08/22/2023     Chemistry      Component Value Date/Time   NA 138 08/22/2023 1241   NA 140 03/10/2018 0837   NA 142 01/21/2013 1315   K 4.3 08/22/2023 1241   K 3.8 01/21/2013 1315   CL 103 08/22/2023 1241   CL 111 (H) 01/30/2012 1356   CO2 30 08/22/2023 1241   CO2 22 01/21/2013 1315   BUN 9 08/22/2023 1241   BUN 10 03/10/2018 0837   BUN 6.9 (L) 01/21/2013 1315   CREATININE 0.83 08/22/2023 1241   CREATININE 0.83 05/03/2014 1356   CREATININE 0.8 01/21/2013 1315      Component Value Date/Time   CALCIUM 9.3 08/22/2023 1241   CALCIUM 8.7 01/21/2013 1315   ALKPHOS 72 08/22/2023 1241   ALKPHOS 67 01/21/2013 1315   AST 22 08/22/2023 1241   AST 32 01/21/2013 1315   ALT 30 08/22/2023 1241   ALT 28 01/21/2013 1315   BILITOT 0.4 08/22/2023 1241   BILITOT 0.30 01/21/2013 1315       RADIOGRAPHIC STUDIES: I have personally reviewed the radiological images as listed and agreed with the findings in the report. No results found.  All questions were answered. The patient knows to call the clinic with any problems, questions or concerns. I  spent 20 minutes in the care of this patient including H and P, review of records, counseling and coordination of care.     Murleen Arms, MD 08/22/2023 1:12 PM

## 2023-08-23 ENCOUNTER — Ambulatory Visit: Payer: Self-pay | Admitting: Hematology and Oncology

## 2023-08-23 ENCOUNTER — Telehealth: Payer: Self-pay

## 2023-08-23 NOTE — Telephone Encounter (Signed)
 S/w pt per scheduler message. Pt states that d/t her health and many Dr appts she attends, she wishes not to schedule appts out but prefers to call and schedule appts when they are needed. Per MD LOS, she needs to come q68m for labs then phlebotomy the following day, so next would be September. She will call us  to schedule this closer to time. While she is here in September, she states she will schedule her December labs and MD visit.   This message forwarded to MD for awareness.

## 2023-08-23 NOTE — Telephone Encounter (Signed)
 Attempted to call pt to advise scheduling will be calling her for this. LVM for call back if needed. Message sent to scheduling.

## 2023-08-23 NOTE — Telephone Encounter (Signed)
-----   Message from [place] H sent at 08/23/2023  2:21 PM EDT ----- Regarding: RE: Phlebotomy Pt did not want to schedule out when I scheduled with her yesterday ----- Message ----- From: Kay Parson, LPN Sent: 5/62/1308   2:13 PM EDT To: Sanjuan Crumbly; [facility] 2 Subject: Phlebotomy                                     Hi Keshia,  Could you please call pt to arrange appt for a phlebotomy with infusions, next available please?  Thx, LB

## 2023-08-23 NOTE — Telephone Encounter (Signed)
-----   Message from Juliette Iruku sent at 08/23/2023  8:37 AM EDT ----- Like we discussed yesterday, we should arrange for phlebotomy. ----- Message ----- From: Dannis Dy, Lab In [place] Sent: 08/22/2023  12:46 PM EDT To: Murleen Arms, MD

## 2023-08-26 ENCOUNTER — Encounter: Payer: Self-pay | Admitting: Hematology and Oncology

## 2023-08-28 ENCOUNTER — Inpatient Hospital Stay

## 2023-08-28 ENCOUNTER — Other Ambulatory Visit: Payer: Self-pay | Admitting: *Deleted

## 2023-08-28 DIAGNOSIS — M069 Rheumatoid arthritis, unspecified: Secondary | ICD-10-CM | POA: Diagnosis not present

## 2023-08-28 DIAGNOSIS — Z9049 Acquired absence of other specified parts of digestive tract: Secondary | ICD-10-CM | POA: Diagnosis not present

## 2023-08-28 DIAGNOSIS — Z79899 Other long term (current) drug therapy: Secondary | ICD-10-CM | POA: Diagnosis not present

## 2023-08-28 DIAGNOSIS — F419 Anxiety disorder, unspecified: Secondary | ICD-10-CM | POA: Diagnosis not present

## 2023-08-28 DIAGNOSIS — Q796 Ehlers-Danlos syndrome, unspecified: Secondary | ICD-10-CM | POA: Diagnosis not present

## 2023-08-28 DIAGNOSIS — Z882 Allergy status to sulfonamides status: Secondary | ICD-10-CM | POA: Diagnosis not present

## 2023-08-28 DIAGNOSIS — Z885 Allergy status to narcotic agent status: Secondary | ICD-10-CM | POA: Diagnosis not present

## 2023-08-28 DIAGNOSIS — Z888 Allergy status to other drugs, medicaments and biological substances status: Secondary | ICD-10-CM | POA: Diagnosis not present

## 2023-08-28 NOTE — Patient Instructions (Signed)

## 2023-08-28 NOTE — Progress Notes (Signed)
 Jennifer [name] presents today for phlebotomy per MD orders. Phlebotomy procedure started at 1515 and ended at 1521. 516 grams removed. Patient observed for 30 minutes after procedure without any incident. Patient tolerated procedure well. IV needle removed intact.

## 2023-09-12 DIAGNOSIS — F411 Generalized anxiety disorder: Secondary | ICD-10-CM | POA: Diagnosis not present

## 2023-09-12 DIAGNOSIS — F3341 Major depressive disorder, recurrent, in partial remission: Secondary | ICD-10-CM | POA: Diagnosis not present

## 2023-09-12 DIAGNOSIS — F4010 Social phobia, unspecified: Secondary | ICD-10-CM | POA: Diagnosis not present

## 2023-09-12 DIAGNOSIS — F410 Panic disorder [episodic paroxysmal anxiety] without agoraphobia: Secondary | ICD-10-CM | POA: Diagnosis not present

## 2023-10-10 DIAGNOSIS — F410 Panic disorder [episodic paroxysmal anxiety] without agoraphobia: Secondary | ICD-10-CM | POA: Diagnosis not present

## 2023-10-10 DIAGNOSIS — F4010 Social phobia, unspecified: Secondary | ICD-10-CM | POA: Diagnosis not present

## 2023-10-10 DIAGNOSIS — F411 Generalized anxiety disorder: Secondary | ICD-10-CM | POA: Diagnosis not present

## 2023-10-10 DIAGNOSIS — F3341 Major depressive disorder, recurrent, in partial remission: Secondary | ICD-10-CM | POA: Diagnosis not present

## 2023-10-16 ENCOUNTER — Ambulatory Visit: Admitting: Internal Medicine

## 2023-10-23 DIAGNOSIS — M0579 Rheumatoid arthritis with rheumatoid factor of multiple sites without organ or systems involvement: Secondary | ICD-10-CM | POA: Diagnosis not present

## 2023-10-23 DIAGNOSIS — M797 Fibromyalgia: Secondary | ICD-10-CM | POA: Diagnosis not present

## 2023-10-23 DIAGNOSIS — Q796 Ehlers-Danlos syndrome, unspecified: Secondary | ICD-10-CM | POA: Diagnosis not present

## 2023-10-23 DIAGNOSIS — G90A Postural orthostatic tachycardia syndrome (POTS): Secondary | ICD-10-CM | POA: Diagnosis not present

## 2023-11-01 ENCOUNTER — Ambulatory Visit: Admitting: Cardiology

## 2023-11-05 ENCOUNTER — Other Ambulatory Visit: Payer: Self-pay | Admitting: Family Medicine

## 2023-11-05 DIAGNOSIS — Z1231 Encounter for screening mammogram for malignant neoplasm of breast: Secondary | ICD-10-CM

## 2023-11-07 DIAGNOSIS — F4010 Social phobia, unspecified: Secondary | ICD-10-CM | POA: Diagnosis not present

## 2023-11-07 DIAGNOSIS — F411 Generalized anxiety disorder: Secondary | ICD-10-CM | POA: Diagnosis not present

## 2023-11-07 DIAGNOSIS — F410 Panic disorder [episodic paroxysmal anxiety] without agoraphobia: Secondary | ICD-10-CM | POA: Diagnosis not present

## 2023-11-07 DIAGNOSIS — F3341 Major depressive disorder, recurrent, in partial remission: Secondary | ICD-10-CM | POA: Diagnosis not present

## 2023-11-13 ENCOUNTER — Ambulatory Visit

## 2023-11-24 NOTE — Progress Notes (Deleted)
  Cardiology Office Note:   Date:  11/24/2023  ID:  Jennifer [name], DOB 12/24/1965, MRN [id number] PCP: Cleotilde Planas, MD  [facility] Providers Cardiologist:  None Electrophysiologist:  Elspeth Sage, MD {  History of Present Illness:   Jennifer [name] is a 58 y.o. female who was previously seen by Dr. Sage.  She has SVT.  She has been managed with propranolol  and nadolol.  She has POTS***   ROS: ***  Studies Reviewed:    EKG:       ***  Risk Assessment/Calculations:   {Does this patient have ATRIAL FIBRILLATION?:[id number]} No BP recorded.  {Refresh Note OR Click here to enter BP  :1}***        Physical Exam:   VS:  There were no vitals taken for this visit.   Wt Readings from Last 3 Encounters:  08/22/23 162 lb 14.4 oz (73.9 kg)  06/24/23 152 lb (68.9 kg)  04/24/23 157 lb (71.2 kg)     GEN: Well nourished, well developed in no acute distress NECK: No JVD; No carotid bruits CARDIAC: ***RR, *** murmurs, rubs, gallops RESPIRATORY:  Clear to auscultation without rales, wheezing or rhonchi  ABDOMEN: Soft, non-tender, non-distended EXTREMITIES:  No edema; No deformity   ASSESSMENT AND PLAN:   *** ITS/POTS:  ***  SVT:  ***  ED III:  ***  FATIGUE:  ***   Follow up ***  Signed, Lynwood Schilling, MD

## 2023-11-26 ENCOUNTER — Ambulatory Visit: Admitting: Cardiology

## 2023-11-26 ENCOUNTER — Other Ambulatory Visit: Payer: Self-pay

## 2023-11-26 DIAGNOSIS — Z862 Personal history of diseases of the blood and blood-forming organs and certain disorders involving the immune mechanism: Secondary | ICD-10-CM

## 2023-11-26 DIAGNOSIS — G901 Familial dysautonomia [Riley-Day]: Secondary | ICD-10-CM

## 2023-11-26 DIAGNOSIS — G90A Postural orthostatic tachycardia syndrome (POTS): Secondary | ICD-10-CM

## 2023-11-26 DIAGNOSIS — I471 Supraventricular tachycardia, unspecified: Secondary | ICD-10-CM

## 2023-11-27 ENCOUNTER — Inpatient Hospital Stay

## 2023-11-27 ENCOUNTER — Telehealth: Payer: Self-pay

## 2023-11-27 NOTE — Telephone Encounter (Signed)
 Pt called and reports she is not able to come in today for labs d/t a fibromyalgia flare up. She would like to r/s. Pt was transferred to our scheduler for asst.

## 2023-11-28 ENCOUNTER — Inpatient Hospital Stay

## 2023-12-04 ENCOUNTER — Inpatient Hospital Stay: Attending: Internal Medicine

## 2023-12-04 DIAGNOSIS — Z862 Personal history of diseases of the blood and blood-forming organs and certain disorders involving the immune mechanism: Secondary | ICD-10-CM | POA: Diagnosis not present

## 2023-12-04 LAB — CMP (CANCER CENTER ONLY)
ALT: 19 U/L (ref 0–44)
AST: 24 U/L (ref 15–41)
Albumin: 4 g/dL (ref 3.5–5.0)
Alkaline Phosphatase: 64 U/L (ref 38–126)
Anion gap: 3 — ABNORMAL LOW (ref 5–15)
BUN: 8 mg/dL (ref 6–20)
CO2: 31 mmol/L (ref 22–32)
Calcium: 9.1 mg/dL (ref 8.9–10.3)
Chloride: 102 mmol/L (ref 98–111)
Creatinine: 0.96 mg/dL (ref 0.44–1.00)
GFR, Estimated: >60 mL/min (ref >60)
Glucose, Bld: 88 mg/dL (ref 70–99)
Potassium: 4.1 mmol/L (ref 3.5–5.1)
Sodium: 136 mmol/L (ref 135–145)
Total Bilirubin: 0.3 mg/dL (ref 0.0–1.2)
Total Protein: 7 g/dL (ref 6.5–8.1)

## 2023-12-04 LAB — CBC WITH DIFFERENTIAL (CANCER CENTER ONLY)
Abs Immature Granulocytes: 0.01 K/uL (ref 0.00–0.07)
Basophils Absolute: 0 K/uL (ref 0.0–0.1)
Basophils Relative: 1 %
Eosinophils Absolute: 0.1 K/uL (ref 0.0–0.5)
Eosinophils Relative: 2 %
HCT: 37 % (ref 36.0–46.0)
Hemoglobin: 12.6 g/dL (ref 12.0–15.0)
Immature Granulocytes: 0 %
Lymphocytes Relative: 34 %
Lymphs Abs: 2.4 K/uL (ref 0.7–4.0)
MCH: 33.8 pg (ref 26.0–34.0)
MCHC: 34.1 g/dL (ref 30.0–36.0)
MCV: 99.2 fL (ref 80.0–100.0)
Monocytes Absolute: 0.6 K/uL (ref 0.1–1.0)
Monocytes Relative: 9 %
Neutro Abs: 3.9 K/uL (ref 1.7–7.7)
Neutrophils Relative %: 54 %
Platelet Count: 241 K/uL (ref 150–400)
RBC: 3.73 MIL/uL — ABNORMAL LOW (ref 3.87–5.11)
RDW: 11.8 % (ref 11.5–15.5)
WBC Count: 7.1 K/uL (ref 4.0–10.5)
nRBC: 0 % (ref 0.0–0.2)

## 2023-12-04 LAB — FERRITIN: Ferritin: 201 ng/mL (ref 11–307)

## 2023-12-05 ENCOUNTER — Encounter: Payer: Self-pay | Admitting: Hematology and Oncology

## 2023-12-05 ENCOUNTER — Encounter: Payer: Self-pay | Admitting: Cardiology

## 2023-12-05 ENCOUNTER — Inpatient Hospital Stay

## 2023-12-06 ENCOUNTER — Telehealth: Payer: Self-pay

## 2023-12-06 ENCOUNTER — Ambulatory Visit: Payer: Self-pay | Admitting: Hematology and Oncology

## 2023-12-06 DIAGNOSIS — F411 Generalized anxiety disorder: Secondary | ICD-10-CM | POA: Diagnosis not present

## 2023-12-06 DIAGNOSIS — F4010 Social phobia, unspecified: Secondary | ICD-10-CM | POA: Diagnosis not present

## 2023-12-06 DIAGNOSIS — F410 Panic disorder [episodic paroxysmal anxiety] without agoraphobia: Secondary | ICD-10-CM | POA: Diagnosis not present

## 2023-12-06 DIAGNOSIS — F3341 Major depressive disorder, recurrent, in partial remission: Secondary | ICD-10-CM | POA: Diagnosis not present

## 2023-12-06 MED ORDER — PROPRANOLOL HCL 20 MG PO TABS: ORAL_TABLET | ORAL | 2 refills | Status: DC

## 2023-12-06 NOTE — Telephone Encounter (Signed)
 Per Dr.Iruku, called pt with message below. Pt is requesting if phlebotomy can wait until December. Advised that I will speak with provider and will give response next week. Pt verbalized understanding.

## 2023-12-06 NOTE — Telephone Encounter (Signed)
 Propanolol refilled, MC to patient.

## 2023-12-06 NOTE — Telephone Encounter (Signed)
-----   Message from [place] Iruku sent at 12/06/2023  1:25 PM EDT ----- Looks like pt wanted to cancel the phlebotomy. Goal ferritin is below 100, so please talk to her and help schedule another phlebotomy appt. No labs needed before the phlebotomy. ----- Message ----- From: Rebecka, Lab In [place] Sent: 12/04/2023   3:05 PM EDT To: Amber Stalls, MD

## 2023-12-13 ENCOUNTER — Ambulatory Visit: Admitting: Cardiology

## 2024-01-01 ENCOUNTER — Ambulatory Visit: Admitting: Cardiology

## 2024-01-03 DIAGNOSIS — F411 Generalized anxiety disorder: Secondary | ICD-10-CM | POA: Diagnosis not present

## 2024-01-03 DIAGNOSIS — F4010 Social phobia, unspecified: Secondary | ICD-10-CM | POA: Diagnosis not present

## 2024-01-03 DIAGNOSIS — F3341 Major depressive disorder, recurrent, in partial remission: Secondary | ICD-10-CM | POA: Diagnosis not present

## 2024-01-03 DIAGNOSIS — F410 Panic disorder [episodic paroxysmal anxiety] without agoraphobia: Secondary | ICD-10-CM | POA: Diagnosis not present

## 2024-01-06 ENCOUNTER — Encounter: Payer: Self-pay | Admitting: Radiology

## 2024-01-15 NOTE — Progress Notes (Deleted)
  Cardiology Office Note:   Date:  01/15/2024  ID:  Jennifer [name], DOB March 23, 1965, MRN [id number] PCP: Cleotilde Planas, MD  [facility] Providers Cardiologist:  None Electrophysiologist:  Elspeth Sage, MD (Inactive) {  History of Present Illness:   Jennifer [name] is a 58 y.o. female  who was previously seen by Dr. Sage.   She had a history of SVT with ablation at [facility].  Dr. Sage treated her for dysautonomia for hypotension and tachycardia.  She had ED -3.     ROS: ***  Studies Reviewed:    EKG:       ***  Risk Assessment/Calculations:   {Does this patient have ATRIAL FIBRILLATION?:[id number]} No BP recorded.  {Refresh Note OR Click here to enter BP  :1}***        Physical Exam:   VS:  There were no vitals taken for this visit.   Wt Readings from Last 3 Encounters:  08/22/23 162 lb 14.4 oz (73.9 kg)  06/24/23 152 lb (68.9 kg)  04/24/23 157 lb (71.2 kg)     GEN: Well nourished, well developed in no acute distress NECK: No JVD; No carotid bruits CARDIAC: ***RR, *** murmurs, rubs, gallops RESPIRATORY:  Clear to auscultation without rales, wheezing or rhonchi  ABDOMEN: Soft, non-tender, non-distended EXTREMITIES:  No edema; No deformity   ASSESSMENT AND PLAN:   ***    Dysautonomia:  ***  SVT:  ***  ED3:  ***  Nocturnal tachycardia:  ***    Follow up ***  Signed, Lynwood Schilling, MD

## 2024-01-16 ENCOUNTER — Ambulatory Visit: Attending: Cardiology | Admitting: Cardiology

## 2024-01-16 ENCOUNTER — Encounter: Payer: Self-pay | Admitting: Cardiology

## 2024-01-16 DIAGNOSIS — I471 Supraventricular tachycardia, unspecified: Secondary | ICD-10-CM

## 2024-01-16 DIAGNOSIS — Q7962 Hypermobile Ehlers-Danlos syndrome: Secondary | ICD-10-CM

## 2024-01-16 DIAGNOSIS — G901 Familial dysautonomia [Riley-Day]: Secondary | ICD-10-CM

## 2024-01-28 ENCOUNTER — Telehealth: Payer: Self-pay | Admitting: Cardiology

## 2024-01-28 NOTE — Telephone Encounter (Signed)
 Patient states that she spoke with the nurse to setup a mychart visit with the dr. But there is no notes on the charrt stating that. Please advise

## 2024-02-03 DIAGNOSIS — F411 Generalized anxiety disorder: Secondary | ICD-10-CM | POA: Diagnosis not present

## 2024-02-03 DIAGNOSIS — F4010 Social phobia, unspecified: Secondary | ICD-10-CM | POA: Diagnosis not present

## 2024-02-03 DIAGNOSIS — F3341 Major depressive disorder, recurrent, in partial remission: Secondary | ICD-10-CM | POA: Diagnosis not present

## 2024-02-03 DIAGNOSIS — F410 Panic disorder [episodic paroxysmal anxiety] without agoraphobia: Secondary | ICD-10-CM | POA: Diagnosis not present

## 2024-02-06 NOTE — Telephone Encounter (Signed)
 Called and spoke with pt. E visit appointment made for 1/22 with Dr. Lavona. Pt verbalized understanding. All questions if any were answered.

## 2024-02-18 ENCOUNTER — Other Ambulatory Visit: Payer: Self-pay | Admitting: *Deleted

## 2024-02-18 DIAGNOSIS — Z862 Personal history of diseases of the blood and blood-forming organs and certain disorders involving the immune mechanism: Secondary | ICD-10-CM

## 2024-02-19 ENCOUNTER — Inpatient Hospital Stay: Attending: Internal Medicine

## 2024-02-20 ENCOUNTER — Inpatient Hospital Stay

## 2024-02-21 ENCOUNTER — Other Ambulatory Visit: Payer: Self-pay

## 2024-02-24 ENCOUNTER — Inpatient Hospital Stay: Attending: Internal Medicine

## 2024-02-24 ENCOUNTER — Inpatient Hospital Stay

## 2024-02-24 DIAGNOSIS — Z862 Personal history of diseases of the blood and blood-forming organs and certain disorders involving the immune mechanism: Secondary | ICD-10-CM | POA: Insufficient documentation

## 2024-02-24 LAB — CBC WITH DIFFERENTIAL (CANCER CENTER ONLY)
Abs Immature Granulocytes: 0.01 K/uL (ref 0.00–0.07)
Basophils Absolute: 0.1 K/uL (ref 0.0–0.1)
Basophils Relative: 1 %
Eosinophils Absolute: 0.1 K/uL (ref 0.0–0.5)
Eosinophils Relative: 2 %
HCT: 36.9 % (ref 36.0–46.0)
Hemoglobin: 12.6 g/dL (ref 12.0–15.0)
Immature Granulocytes: 0 %
Lymphocytes Relative: 36 %
Lymphs Abs: 1.8 K/uL (ref 0.7–4.0)
MCH: 33.1 pg (ref 26.0–34.0)
MCHC: 34.1 g/dL (ref 30.0–36.0)
MCV: 96.9 fL (ref 80.0–100.0)
Monocytes Absolute: 0.5 K/uL (ref 0.1–1.0)
Monocytes Relative: 11 %
Neutro Abs: 2.5 K/uL (ref 1.7–7.7)
Neutrophils Relative %: 50 %
Platelet Count: 259 K/uL (ref 150–400)
RBC: 3.81 MIL/uL — ABNORMAL LOW (ref 3.87–5.11)
RDW: 11.9 % (ref 11.5–15.5)
WBC Count: 5 K/uL (ref 4.0–10.5)
nRBC: 0 % (ref 0.0–0.2)

## 2024-02-24 LAB — CMP (CANCER CENTER ONLY)
ALT: 22 U/L (ref 0–44)
AST: 26 U/L (ref 15–41)
Albumin: 4.1 g/dL (ref 3.5–5.0)
Alkaline Phosphatase: 81 U/L (ref 38–126)
Anion gap: 8 (ref 5–15)
BUN: 13 mg/dL (ref 6–20)
CO2: 26 mmol/L (ref 22–32)
Calcium: 9.2 mg/dL (ref 8.9–10.3)
Chloride: 104 mmol/L (ref 98–111)
Creatinine: 0.9 mg/dL (ref 0.44–1.00)
GFR, Estimated: >60 mL/min
Glucose, Bld: 79 mg/dL (ref 70–99)
Potassium: 4.2 mmol/L (ref 3.5–5.1)
Sodium: 138 mmol/L (ref 135–145)
Total Bilirubin: 0.4 mg/dL (ref 0.0–1.2)
Total Protein: 7.2 g/dL (ref 6.5–8.1)

## 2024-02-24 LAB — FERRITIN: Ferritin: 156 ng/mL (ref 11–307)

## 2024-02-24 NOTE — Progress Notes (Signed)
 Jennifer [name] presents today for phlebotomy per MD orders. Phlebotomy procedure started at 1440 and ended at 1446. 504 grams removed. 16G phlebotomy kit used to R AC.  Patient observed for 20 minutes after procedure without any incident. Patient tolerated procedure well. Vitals stable. IV needle removed intact.

## 2024-03-02 ENCOUNTER — Other Ambulatory Visit: Payer: Self-pay | Admitting: Student

## 2024-03-02 DIAGNOSIS — F411 Generalized anxiety disorder: Secondary | ICD-10-CM | POA: Diagnosis not present

## 2024-03-02 DIAGNOSIS — F410 Panic disorder [episodic paroxysmal anxiety] without agoraphobia: Secondary | ICD-10-CM | POA: Diagnosis not present

## 2024-03-02 DIAGNOSIS — F3341 Major depressive disorder, recurrent, in partial remission: Secondary | ICD-10-CM | POA: Diagnosis not present

## 2024-03-02 DIAGNOSIS — F4010 Social phobia, unspecified: Secondary | ICD-10-CM | POA: Diagnosis not present

## 2024-03-03 MED ORDER — ONDANSETRON 8 MG PO TBDP: ORAL_TABLET | ORAL | 5 refills | Status: DC

## 2024-03-25 NOTE — Progress Notes (Unsigned)
 {Choose 1 Note Type (Video or Telephone):[phone]}    Date:  03/25/2024   ID:  Clotilda BRAVO Boeh, DOB 10-29-1965, MRN [id number] The patient was identified using 2 identifiers.  {Patient Location:[phone]::Home} {Provider [phone]::Home Office}   PCP:  Cleotilde Planas, MD   [facility] Providers Cardiologist:  None Electrophysiologist:  Elspeth Sage, MD (Inactive) { Click to update primary MD,subspecialty MD or APP then REFRESH:1}    Evaluation Performed:  {Choose Visit Type:[phone]::Follow-Up Visit}  Chief Complaint:  ***  History of Present Illness:    Bridget Greenup Suttles is a 59 y.o. female who saw Dr Sage in the past with fatigue, palpitations and syncope.  ***      59 y.o. female who presents via audio/video conferencing for a telehealth visit today.  Since last being seen in our clinic  the patient reports having continued to struggle with high HR and low BP.    They worsened this summer and then significantly following the death of her father in 11/15/2024- devestating emotionally and she is stoic and BP and HR have been poorly tolerating    Has spent most of her time in bed or chair     She was seen   [facility] 12/2 with multiple complaints and intolerances to multiple med outlined, significant fatigue *and she recounts no more options More pain 2/2 rheumatoid  Nauseated w nausea --decreased appetite and decreased PO intake with decreased fluids  Wearing a compression garment feet to abdomen   Seen 10/22 while I was away ([place]) with complaints of blood pressures in the 50s-80s and heart rates up to the 150s.  Orthostatics in the office that day pulse 75--119 and blood pressure 96--84; symptom events were associated with PACs the overall burden of which was about 5.9% and were evenly distributed over the 24-hour period.   Describing nocturnal palpitations, interestingly, she awakens at night her heart rates are 80 or so she stands up and her heart  rates are 130.  She frequently ends up waking at night to urinate.   Syncope 12/22 in the bathroom.          Past Medical History:  Diagnosis Date   Anxiety    Arthritis    inactive RA- no meds   ASD (atrial septal defect)    Closure    Depression    Dysrhythmia    PVCs   Headache(784.0)    migraines   Hemochromatosis    IBS (irritable bowel syndrome)    Orthostatic syncope    POTS (postural orthostatic tachycardia syndrome)    Rheumatoid arthritis(714.0) 02/04/2012   Past Surgical History:  Procedure Laterality Date   CHOLECYSTECTOMY  1989   DILATION AND CURETTAGE OF UTERUS  2004   LAPAROSCOPIC TUBAL LIGATION  10/26/2010   Procedure: LAPAROSCOPIC TUBAL LIGATION;  Surgeon: Truman Corona;  Location: [facility];  Service: Gynecology;  Laterality: Bilateral;   LAPAROSCOPY     PATENT FORAMEN OVALE CLOSURE  2008     Active Medications[1]   Allergies:   Florinef  [fludrocortisone ], Infliximab, Leflunomide, Methotrexate and trimetrexate, Reglan  [metoclopramide ], Sulfacetamide-prednisolone, Morphine and codeine, and Sulfonamide derivatives   Social History[2]   Family Hx: The patient's family history includes Hemochromatosis in her mother and sister.  ROS:   Please see the history of present illness.    *** All other systems reviewed and are negative.   Prior CV studies:   The following studies were reviewed today:  ***  Labs/Other Tests and Data Reviewed:    EKG:  {  EKG/Telemetry Strips Reviewed:[id number]}  Recent Labs: 02/24/2024: ALT 22; BUN 13; Creatinine 0.90; Hemoglobin 12.6; Platelet Count 259; Potassium 4.2; Sodium 138   Recent Lipid Panel No results found for: CHOL, TRIG, HDL, CHOLHDL, LDLCALC, LDLDIRECT  Wt Readings from Last 3 Encounters:  08/22/23 162 lb 14.4 oz (73.9 kg)  06/24/23 152 lb (68.9 kg)  04/24/23 157 lb (71.2 kg)     Risk Assessment/Calculations:   {Does this patient have ATRIAL FIBRILLATION?[phone]}       Objective:    Vital Signs:  There were no vitals taken for this visit.   {[facility] Virtual Exam (Optional):[phone]::VITAL SIGNS:  reviewed}  ASSESSMENT & PLAN:    Dysautonomia with hypotension and tachycardia:   ***    Rheumatoid arthritis   Depression-primary and secondary   Arachnodactyly   SVT:  ***     Ehlers-Danlos 3 Joint hypermobility   Nocturnal tachycardia:  ***    Nocturnal tachycardia  is awakening--reviewing the [place] note however outlines very frequent nightmares related to PTSD.  It makes me wonder whether his nocturnal tachycardia is in fact not orthostatic but altogether secondary to her nightmares.  If this is the case, perhaps therapy directed at the nightmares i.e. prazosin or may be clonidine in addition to the cognitive things and EMDR might go a long way or even a short way in mitigating some of the symptoms.   Her daytime hypotension remains an issue.  Part of it is reactive.  I have encouraged her to use her compression wear regularly.     Continue salt and water repletion.   Discussed medication options as outlined below.   Try CUBII      {Are you ordering a CV Procedure (e.g. stress test, cath, DCCV, TEE, etc)?   Press F2        :[id number]}     Time:   Today, I have spent *** minutes with the patient with telehealth technology discussing the above problems.     Medication Adjustments/Labs and Tests Ordered: Current medicines are reviewed at length with the patient today.  Concerns regarding medicines are outlined above.   Tests Ordered: No orders of the defined types were placed in this encounter.   Medication Changes: No orders of the defined types were placed in this encounter.   Follow Up:  {F/U Format:[phone]} {follow up:15908}  Signed, Lynwood Schilling, MD  03/25/2024 10:15 PM    [facility]     [1]  No outpatient medications have been marked as taking for the 03/26/24 encounter (Appointment) with  Schilling Lynwood, MD.  [2]  Social History Tobacco Use   Smoking status: Never    Passive exposure: Past   Smokeless tobacco: Never  Vaping Use   Vaping status: Never Used  Substance Use Topics   Alcohol  use: Yes    Comment: occasionally.   Drug use: No

## 2024-03-26 ENCOUNTER — Encounter: Payer: Self-pay | Admitting: Cardiology

## 2024-03-26 ENCOUNTER — Telehealth: Payer: Self-pay

## 2024-03-26 ENCOUNTER — Ambulatory Visit: Attending: Cardiology | Admitting: Cardiology

## 2024-03-26 VITALS — BP 106/78 | HR 94 | Ht 67.0 in | Wt 174.0 lb

## 2024-03-26 DIAGNOSIS — Q7962 Hypermobile Ehlers-Danlos syndrome: Secondary | ICD-10-CM

## 2024-03-26 DIAGNOSIS — I951 Orthostatic hypotension: Secondary | ICD-10-CM | POA: Diagnosis not present

## 2024-03-26 DIAGNOSIS — I471 Supraventricular tachycardia, unspecified: Secondary | ICD-10-CM | POA: Diagnosis not present

## 2024-03-26 DIAGNOSIS — G901 Familial dysautonomia [Riley-Day]: Secondary | ICD-10-CM | POA: Diagnosis not present

## 2024-03-26 MED ORDER — ONDANSETRON 8 MG PO TBDP: ORAL_TABLET | ORAL | 3 refills | Status: AC

## 2024-03-26 MED ORDER — PROPRANOLOL HCL 20 MG PO TABS: ORAL_TABLET | ORAL | 3 refills | Status: AC

## 2024-03-26 NOTE — Telephone Encounter (Signed)
 Attempted to reach patient and get her checked in for her mychart visit. 1st attempt

## 2024-03-26 NOTE — Telephone Encounter (Signed)
"  °  Patient Consent for Virtual Visit        Jennifer [name] has provided verbal consent on 03/26/2024 for a virtual visit (video or telephone).   CONSENT FOR VIRTUAL VISIT FOR:  Jennifer [name]  By participating in this virtual visit I agree to the following:  I hereby voluntarily request, consent and authorize [facility] and its employed or contracted physicians, physician assistants, nurse practitioners or other licensed health care professionals (the Practitioner), to provide me with telemedicine health care services (the Services) as deemed necessary by the treating Practitioner. I acknowledge and consent to receive the Services by the Practitioner via telemedicine. I understand that the telemedicine visit will involve communicating with the Practitioner through live audiovisual communication technology and the disclosure of certain medical information by electronic transmission. I acknowledge that I have been given the opportunity to request an in-person assessment or other available alternative prior to the telemedicine visit and am voluntarily participating in the telemedicine visit.  I understand that I have the right to withhold or withdraw my consent to the use of telemedicine in the course of my care at any time, without affecting my right to future care or treatment, and that the Practitioner or I may terminate the telemedicine visit at any time. I understand that I have the right to inspect all information obtained and/or recorded in the course of the telemedicine visit and may receive copies of available information for a reasonable fee.  I understand that some of the potential risks of receiving the Services via telemedicine include:  Delay or interruption in medical evaluation due to technological equipment failure or disruption; Information transmitted may not be sufficient (e.g. poor resolution of images) to allow for appropriate medical decision making by the Practitioner;  and/or  In rare instances, security protocols could fail, causing a breach of personal health information.  Furthermore, I acknowledge that it is my responsibility to provide information about my medical history, conditions and care that is complete and accurate to the best of my ability. I acknowledge that Practitioner's advice, recommendations, and/or decision may be based on factors not within their control, such as incomplete or inaccurate data provided by me or distortions of diagnostic images or specimens that may result from electronic transmissions. I understand that the practice of medicine is not an exact science and that Practitioner makes no warranties or guarantees regarding treatment outcomes. I acknowledge that a copy of this consent can be made available to me via my patient portal [facility] MyChart), or I can request a printed copy by calling the office of  [facility].    I understand that my insurance will be billed for this visit.   I have read or had this consent read to me. I understand the contents of this consent, which adequately explains the benefits and risks of the Services being provided via telemedicine.  I have been provided ample opportunity to ask questions regarding this consent and the Services and have had my questions answered to my satisfaction. I give my informed consent for the services to be provided through the use of telemedicine in my medical care    "

## 2024-03-27 NOTE — Telephone Encounter (Signed)
 Handled yesterday over phone- had video visit
